# Patient Record
Sex: Female | Born: 1935 | Race: White | Hispanic: No | Marital: Single | Smoking: Former smoker
Health system: Southern US, Community
[De-identification: ages and names within clinical notes are randomized; demographics above are authoritative.]

## PROBLEM LIST (undated history)

## (undated) DIAGNOSIS — N183 Chronic kidney disease, stage 3 unspecified: Secondary | ICD-10-CM

## (undated) DIAGNOSIS — I509 Heart failure, unspecified: Secondary | ICD-10-CM

## (undated) DIAGNOSIS — E079 Disorder of thyroid, unspecified: Secondary | ICD-10-CM

## (undated) DIAGNOSIS — E876 Hypokalemia: Secondary | ICD-10-CM

## (undated) DIAGNOSIS — I10 Essential (primary) hypertension: Secondary | ICD-10-CM

## (undated) DIAGNOSIS — J449 Chronic obstructive pulmonary disease, unspecified: Secondary | ICD-10-CM

## (undated) DIAGNOSIS — E785 Hyperlipidemia, unspecified: Secondary | ICD-10-CM

## (undated) DIAGNOSIS — E559 Vitamin D deficiency, unspecified: Secondary | ICD-10-CM

## (undated) DIAGNOSIS — K219 Gastro-esophageal reflux disease without esophagitis: Secondary | ICD-10-CM

## (undated) HISTORY — DX: Vitamin D deficiency, unspecified: E55.9

## (undated) HISTORY — DX: Hypokalemia: E87.6

## (undated) HISTORY — DX: Disorder of thyroid, unspecified: E07.9

## (undated) HISTORY — DX: Hyperlipidemia, unspecified: E78.5

## (undated) HISTORY — PX: ABDOMINAL HYSTERECTOMY: SHX81

## (undated) HISTORY — DX: Essential (primary) hypertension: I10

## (undated) HISTORY — PX: CHOLECYSTECTOMY: SHX55

---

## 2012-03-01 ENCOUNTER — Emergency Department (HOSPITAL_COMMUNITY): Payer: Medicare Other

## 2012-03-01 ENCOUNTER — Inpatient Hospital Stay (HOSPITAL_COMMUNITY)
Admission: EM | Admit: 2012-03-01 | Discharge: 2012-03-13 | DRG: 871 | Disposition: A | Discharge status: Skilled Nursing Facility | Payer: Medicare Other | Attending: Pulmonary Disease | Admitting: Pulmonary Disease

## 2012-03-01 DIAGNOSIS — E876 Hypokalemia: Secondary | ICD-10-CM | POA: Diagnosis present

## 2012-03-01 DIAGNOSIS — J982 Interstitial emphysema: Secondary | ICD-10-CM | POA: Diagnosis present

## 2012-03-01 DIAGNOSIS — R1310 Dysphagia, unspecified: Secondary | ICD-10-CM | POA: Diagnosis present

## 2012-03-01 DIAGNOSIS — Z66 Do not resuscitate: Secondary | ICD-10-CM | POA: Diagnosis present

## 2012-03-01 DIAGNOSIS — K7200 Acute and subacute hepatic failure without coma: Secondary | ICD-10-CM | POA: Diagnosis present

## 2012-03-01 DIAGNOSIS — G9340 Encephalopathy, unspecified: Secondary | ICD-10-CM

## 2012-03-01 DIAGNOSIS — E870 Hyperosmolality and hypernatremia: Secondary | ICD-10-CM | POA: Diagnosis present

## 2012-03-01 DIAGNOSIS — T797XXA Traumatic subcutaneous emphysema, initial encounter: Secondary | ICD-10-CM

## 2012-03-01 DIAGNOSIS — K223 Perforation of esophagus: Secondary | ICD-10-CM | POA: Diagnosis present

## 2012-03-01 DIAGNOSIS — J9601 Acute respiratory failure with hypoxia: Secondary | ICD-10-CM

## 2012-03-01 DIAGNOSIS — E785 Hyperlipidemia, unspecified: Secondary | ICD-10-CM | POA: Diagnosis present

## 2012-03-01 DIAGNOSIS — A419 Sepsis, unspecified organism: Principal | ICD-10-CM | POA: Diagnosis present

## 2012-03-01 DIAGNOSIS — M4120 Other idiopathic scoliosis, site unspecified: Secondary | ICD-10-CM | POA: Diagnosis present

## 2012-03-01 DIAGNOSIS — D72829 Elevated white blood cell count, unspecified: Secondary | ICD-10-CM

## 2012-03-01 DIAGNOSIS — E872 Acidosis: Secondary | ICD-10-CM

## 2012-03-01 DIAGNOSIS — N179 Acute kidney failure, unspecified: Secondary | ICD-10-CM | POA: Diagnosis present

## 2012-03-01 DIAGNOSIS — I2510 Atherosclerotic heart disease of native coronary artery without angina pectoris: Secondary | ICD-10-CM | POA: Diagnosis present

## 2012-03-01 DIAGNOSIS — I248 Other forms of acute ischemic heart disease: Secondary | ICD-10-CM | POA: Diagnosis present

## 2012-03-01 DIAGNOSIS — I10 Essential (primary) hypertension: Secondary | ICD-10-CM | POA: Diagnosis present

## 2012-03-01 DIAGNOSIS — R4182 Altered mental status, unspecified: Secondary | ICD-10-CM

## 2012-03-01 DIAGNOSIS — J9690 Respiratory failure, unspecified, unspecified whether with hypoxia or hypercapnia: Secondary | ICD-10-CM

## 2012-03-01 DIAGNOSIS — I2489 Other forms of acute ischemic heart disease: Secondary | ICD-10-CM | POA: Diagnosis present

## 2012-03-01 DIAGNOSIS — J9600 Acute respiratory failure, unspecified whether with hypoxia or hypercapnia: Secondary | ICD-10-CM | POA: Diagnosis present

## 2012-03-01 DIAGNOSIS — I252 Old myocardial infarction: Secondary | ICD-10-CM

## 2012-03-01 DIAGNOSIS — Y849 Medical procedure, unspecified as the cause of abnormal reaction of the patient, or of later complication, without mention of misadventure at the time of the procedure: Secondary | ICD-10-CM | POA: Diagnosis present

## 2012-03-01 DIAGNOSIS — J189 Pneumonia, unspecified organism: Secondary | ICD-10-CM | POA: Diagnosis present

## 2012-03-01 DIAGNOSIS — T8182XA Emphysema (subcutaneous) resulting from a procedure, initial encounter: Secondary | ICD-10-CM | POA: Diagnosis present

## 2012-03-01 DIAGNOSIS — J690 Pneumonitis due to inhalation of food and vomit: Secondary | ICD-10-CM

## 2012-03-01 LAB — POCT I-STAT, CHEM 8
Calcium, Ion: 1.04 mmol/L — ABNORMAL LOW (ref 1.13–1.30)
Creatinine, Ser: 2.1 mg/dL — ABNORMAL HIGH (ref 0.50–1.10)
Glucose, Bld: 121 mg/dL — ABNORMAL HIGH (ref 70–99)
HCT: 44 % (ref 36.0–46.0)
Hemoglobin: 15 g/dL (ref 12.0–15.0)

## 2012-03-01 LAB — RAPID URINE DRUG SCREEN, HOSP PERFORMED
Cocaine: NOT DETECTED
Opiates: NOT DETECTED
Tetrahydrocannabinol: NOT DETECTED

## 2012-03-01 LAB — CBC WITH DIFFERENTIAL/PLATELET
Basophils Absolute: 0 10*3/uL (ref 0.0–0.1)
Eosinophils Absolute: 0 10*3/uL (ref 0.0–0.7)
Hemoglobin: 14.4 g/dL (ref 12.0–15.0)
Lymphocytes Relative: 7 % — ABNORMAL LOW (ref 12–46)
MCH: 30.8 pg (ref 26.0–34.0)
MCHC: 32.7 g/dL (ref 30.0–36.0)
Monocytes Absolute: 0.9 10*3/uL (ref 0.1–1.0)
Neutrophils Relative %: 85 % — ABNORMAL HIGH (ref 43–77)
Platelets: 161 10*3/uL (ref 150–400)
RDW: 15.9 % — ABNORMAL HIGH (ref 11.5–15.5)
WBC Morphology: INCREASED

## 2012-03-01 LAB — POCT I-STAT 3, ART BLOOD GAS (G3+)
Acid-Base Excess: 4 mmol/L — ABNORMAL HIGH (ref 0.0–2.0)
Bicarbonate: 29.7 mEq/L — ABNORMAL HIGH (ref 20.0–24.0)
Patient temperature: 103
TCO2: 31 mmol/L (ref 0–100)
pH, Arterial: 7.377 (ref 7.350–7.450)
pO2, Arterial: 198 mmHg — ABNORMAL HIGH (ref 80.0–100.0)

## 2012-03-01 LAB — TYPE AND SCREEN: Antibody Screen: NEGATIVE

## 2012-03-01 LAB — COMPREHENSIVE METABOLIC PANEL
ALT: 3160 U/L — ABNORMAL HIGH (ref 0–35)
Albumin: 3 g/dL — ABNORMAL LOW (ref 3.5–5.2)
Alkaline Phosphatase: 95 U/L (ref 39–117)
Calcium: 9 mg/dL (ref 8.4–10.5)
Potassium: 3.8 mEq/L (ref 3.5–5.1)
Sodium: 145 mEq/L (ref 135–145)
Total Protein: 6.7 g/dL (ref 6.0–8.3)

## 2012-03-01 LAB — URINALYSIS, ROUTINE W REFLEX MICROSCOPIC
Nitrite: NEGATIVE
Specific Gravity, Urine: 1.019 (ref 1.005–1.030)
Urobilinogen, UA: 1 mg/dL (ref 0.0–1.0)
pH: 5.5 (ref 5.0–8.0)

## 2012-03-01 LAB — URINE MICROSCOPIC-ADD ON

## 2012-03-01 LAB — ETHANOL: Alcohol, Ethyl (B): <11 mg/dL (ref 0–11)

## 2012-03-01 LAB — CK: Total CK: 194 U/L — ABNORMAL HIGH (ref 7–177)

## 2012-03-01 LAB — APTT: aPTT: 33 seconds (ref 24–37)

## 2012-03-01 LAB — LACTIC ACID, PLASMA: Lactic Acid, Venous: 4.5 mmol/L — ABNORMAL HIGH (ref 0.5–2.2)

## 2012-03-01 MED ORDER — SODIUM CHLORIDE 0.9 % IV SOLN
250.0000 mL | INTRAVENOUS | Status: DC | PRN
  Administered 2012-03-10: 250 mL via INTRAVENOUS

## 2012-03-01 MED ORDER — ALBUTEROL SULFATE HFA 108 (90 BASE) MCG/ACT IN AERS: 4.0000 | INHALATION_SPRAY | RESPIRATORY_TRACT | Status: DC | PRN

## 2012-03-01 MED ORDER — PANTOPRAZOLE SODIUM 40 MG IV SOLR
40.0000 mg | Freq: Every day | INTRAVENOUS | Status: DC
  Administered 2012-03-03 – 2012-03-04 (×2): 40 mg via INTRAVENOUS
  Filled 2012-03-01 (×5): qty 40

## 2012-03-01 MED ORDER — ASPIRIN 300 MG RE SUPP
300.0000 mg | RECTAL | Status: AC
  Administered 2012-03-01: 300 mg via RECTAL
  Filled 2012-03-01: qty 1

## 2012-03-01 MED ORDER — VANCOMYCIN HCL IN DEXTROSE 1-5 GM/200ML-% IV SOLN: 1000.0000 mg | Freq: Once | INTRAVENOUS | Status: DC

## 2012-03-01 MED ORDER — HEPARIN SODIUM (PORCINE) 5000 UNIT/ML IJ SOLN
5000.0000 [IU] | Freq: Three times a day (TID) | INTRAMUSCULAR | Status: DC
  Administered 2012-03-02 – 2012-03-13 (×34): 5000 [IU] via SUBCUTANEOUS
  Filled 2012-03-01 (×39): qty 1

## 2012-03-01 MED ORDER — PIPERACILLIN-TAZOBACTAM 3.375 G IVPB
3.3750 g | Freq: Once | INTRAVENOUS | Status: AC
  Administered 2012-03-01: 3.375 g via INTRAVENOUS
  Filled 2012-03-01 (×2): qty 50

## 2012-03-01 MED ORDER — SODIUM CHLORIDE 0.9 % IV SOLN
INTRAVENOUS | Status: DC
  Administered 2012-03-01 – 2012-03-02 (×2): via INTRAVENOUS
  Administered 2012-03-03: 125 mL/h via INTRAVENOUS

## 2012-03-01 MED ORDER — VANCOMYCIN HCL IN DEXTROSE 1-5 GM/200ML-% IV SOLN
1000.0000 mg | Freq: Once | INTRAVENOUS | Status: AC
  Administered 2012-03-01: 1000 mg via INTRAVENOUS
  Filled 2012-03-01: qty 200

## 2012-03-01 MED ORDER — PIPERACILLIN-TAZOBACTAM 3.375 G IVPB 30 MIN: 3.3750 g | Freq: Once | INTRAVENOUS | Status: DC

## 2012-03-01 MED ORDER — ASPIRIN 81 MG PO CHEW: 324.0000 mg | CHEWABLE_TABLET | ORAL | Status: AC

## 2012-03-01 NOTE — Progress Notes (Signed)
Pt has scoliosis. Asymmetrical thoracic cage. Subcutaneous emphysema across upper chest.

## 2012-03-01 NOTE — ED Notes (Signed)
Pt noted to have crepitus from upper chest to rt eye.

## 2012-03-01 NOTE — ED Notes (Signed)
Per EMS- pt was found in her bed at home. Last seen normal ws 2 days ago.  Pt was unresponsive with shallow respirations at 10 per minute. Pt noted to have rhonchi with ems. bp was 110 palpated. Pt had no purposeful movements. Pt was diaphoretic and cyanotic upon EMS arrival. Pt ws intubated with O2 sats in the 90-91%pt ws given 2 of narcan with no change. Pupils were dilated initially and now pinpoint. Pt vomited with ET tube placement.

## 2012-03-01 NOTE — ED Provider Notes (Signed)
I saw and evaluated the patient, reviewed the resident's note and I agree with the findings and plan.  Agree with EKG interpretation  Pt arrived intubated from home where she was found hypoxic and unresponsive. Has pneumonia, fever, septic shock with multiple end organ damage. Also has subcutaneous emphysema likely from ETT trauma. No PTX. PCCM to admit. Abx given after cultures.   CRITICAL CARE Performed by: Pollyann Savoy   Total critical care time: 60  Critical care time was exclusive of separately billable procedures and treating other patients.  Critical care was necessary to treat or prevent imminent or life-threatening deterioration.  Critical care was time spent personally by me on the following activities: development of treatment plan with patient and/or surrogate as well as nursing, discussions with consultants, evaluation of patient's response to treatment, examination of patient, obtaining history from patient or surrogate, ordering and performing treatments and interventions, ordering and review of laboratory studies, ordering and review of radiographic studies, pulse oximetry and re-evaluation of patient's condition.   Charles B. Bernette Mayers, MD 03/01/12 2229

## 2012-03-01 NOTE — Progress Notes (Signed)
Pt has new subcutaneous emphysema in face and neck. MD notified.

## 2012-03-01 NOTE — ED Provider Notes (Signed)
History     CSN: [id number]  Arrival date & time 03/01/12  1951   First MD Initiated Contact with Patient 03/01/12 2002      Chief Complaint  Patient presents with  . Altered Mental Status    (Consider location/radiation/quality/duration/timing/severity/associated sxs/prior treatment) Patient is a 78 y.o. female presenting with altered mental status. The history is provided by the EMS personnel. The history is limited by the condition of the patient.  Altered Mental Status This is a new problem. The current episode started today. The problem occurs constantly. The problem has been unchanged. Nothing aggravates the symptoms. Treatments tried: narcan. The treatment provided no relief.   Level 5 caveat due to altered mental status and intubated No past medical history on file.  No past surgical history on file.  No family history on file.  History  Substance Use Topics  . Smoking status: Not on file  . Smokeless tobacco: Not on file  . Alcohol Use: Not on file    OB History    No data available      Review of Systems  Unable to perform ROS: Intubated  Psychiatric/Behavioral: Positive for altered mental status.    Allergies  Review of patient's allergies indicates not on file.  Home Medications   Current Outpatient Rx  Name Route Sig Dispense Refill  . AMLODIPINE BESYLATE 5 MG PO TABS Oral Take 5 mg by mouth daily.    . ATORVASTATIN CALCIUM 10 MG PO TABS Oral Take 10 mg by mouth at bedtime.    Marland Kitchen LEVOTHYROXINE SODIUM 175 MCG PO TABS Oral Take 175 mcg by mouth daily.    Marland Kitchen METOPROLOL TARTRATE 100 MG PO TABS Oral Take 100 mg by mouth daily.    . TRAMADOL HCL 50 MG PO TABS Oral Take 50 mg by mouth every 6 (six) hours as needed. pain    . VITAMIN D (ERGOCALCIFEROL) 50000 UNITS PO CAPS Oral Take 50,000 Units by mouth 2 (two) times a week.      BP 139/55  Pulse 86  Resp 24  SpO2 99%  Physical Exam  Constitutional: She is intubated.  HENT:  Head: Normocephalic and  atraumatic.  Eyes:       2 mm, not responsive  Cardiovascular: Normal rate, regular rhythm, normal heart sounds and intact distal pulses.   Pulmonary/Chest: She is intubated.       Equal breath sounds, coarse diffusely, upper airway noises  Abdominal: Soft. Bowel sounds are normal. She exhibits no distension.  Neurological: She is unresponsive. GCS eye subscore is 1. GCS verbal subscore is 1. GCS motor subscore is 1.  Skin: Skin is warm and dry.    ED Course  Procedures (including critical care time)  Labs Reviewed  CBC WITH DIFFERENTIAL - Abnormal; Notable for the following:    WBC 10.7 (*)     RDW 15.9 (*)     Neutrophils Relative 85 (*)     Lymphocytes Relative 7 (*)     Neutro Abs 9.0 (*)     All other components within normal limits  COMPREHENSIVE METABOLIC PANEL - Abnormal; Notable for the following:    Glucose, Bld 122 (*)     BUN 39 (*)     Creatinine, Ser 2.04 (*)     Albumin 3.0 (*)     AST 5010 (*)  RESULTS CONFIRMED BY MANUAL DILUTION   ALT 3160 (*)     GFR calc non Af Amer 22 (*)     GFR  calc Af Amer 25 (*)     All other components within normal limits  CK - Abnormal; Notable for the following:    Total CK 194 (*)     All other components within normal limits  URINALYSIS, ROUTINE W REFLEX MICROSCOPIC - Abnormal; Notable for the following:    Color, Urine AMBER (*)  BIOCHEMICALS MAY BE AFFECTED BY COLOR   APPearance CLOUDY (*)     Hgb urine dipstick SMALL (*)     Bilirubin Urine SMALL (*)     Protein, ur 100 (*)     All other components within normal limits  PROTIME-INR - Abnormal; Notable for the following:    Prothrombin Time 17.4 (*)     All other components within normal limits  LACTIC ACID, PLASMA - Abnormal; Notable for the following:    Lactic Acid, Venous 4.5 (*)     All other components within normal limits  POCT I-STAT, CHEM 8 - Abnormal; Notable for the following:    BUN 42 (*)     Creatinine, Ser 2.10 (*)     Glucose, Bld 121 (*)      Calcium, Ion 1.04 (*)     All other components within normal limits  POCT I-STAT TROPONIN I - Abnormal; Notable for the following:    Troponin i, poc 0.65 (*)     All other components within normal limits  POCT I-STAT 3, BLOOD GAS (G3+) - Abnormal; Notable for the following:    pCO2 arterial 51.8 (*)     pO2, Arterial 198.0 (*)     Bicarbonate 29.7 (*)     Acid-Base Excess 4.0 (*)     All other components within normal limits  URINE MICROSCOPIC-ADD ON - Abnormal; Notable for the following:    Squamous Epithelial / LPF FEW (*)     Bacteria, UA FEW (*)     Casts GRANULAR CAST (*)  HYALINE CASTS   All other components within normal limits  APTT  PROCALCITONIN  ETHANOL  URINE RAPID DRUG SCREEN (HOSP PERFORMED)  TYPE AND SCREEN  URINE CULTURE  CULTURE, BLOOD (ROUTINE X 2)  CULTURE, BLOOD (ROUTINE X 2)  CBC  CREATININE, SERUM  CBC  BASIC METABOLIC PANEL  BLOOD GAS, ARTERIAL  MAGNESIUM  PHOSPHORUS  LACTIC ACID, PLASMA  CORTISOL  FIBRINOGEN  CULTURE, EXPECTORATED SPUTUM-ASSESSMENT   Ct Head Wo Contrast  03/01/2012  *RADIOLOGY REPORT*  Clinical Data: The patient was found in the head at home.  Last seen normal 2 days ago.  The patient was unresponsive to shallow respirations.  Pupils dilated initially and now and point. Vomiting with endotracheal tube placement.  CT HEAD WITHOUT CONTRAST  Technique:  Contiguous axial images were obtained from the base of the skull through the vertex without contrast.  Comparison: None.  Findings: Technically limited study due to motion artifact. Diffuse cerebral and cerebellar atrophy.  Mild ventricular dilatation consistent with central atrophy.  Low attenuation change throughout the deep white matter consistent with small vessel ischemic changes.  Old appearing lacunar infarcts in the thalami. No mass effect or midline shift.  No sulci effacement.  No abnormal extra-axial fluid collections.  Gray-white matter junctions are distinct.  Basal cisterns are  not effaced.  No depressed skull fractures.  Visualized paranasal sinuses and mastoid air cells are not opacified.  Vascular calcifications.  Note of subcutaneous emphysema in the upper neck extending to the skull base region.  IMPRESSION: No acute intracranial abnormality.  Diffuse bilateral cerebral atrophy and  small vessel ischemic change.  Original Report Authenticated By: Marlon Pel, M.D.   Ct Soft Tissue Neck Wo Contrast  03/01/2012  *RADIOLOGY REPORT*  Clinical Data: Altered mental status.  Unresponsive.  Subcutaneous air.  CT NECK WITHOUT CONTRAST  Technique:  Multidetector CT imaging of the neck was performed without intravenous contrast.  Comparison: None.  Findings: There is extensive subcutaneous emphysema throughout the soft tissues in the anterior chest wall, supraclavicular region, and neck.  Right periorbital soft tissue swelling with extensive subcutaneous emphysema.  Emphysema in the right lateral extraconal space.  No retrobulbar emphysema.  Emphysema throughout the fat planes of the neck pain extending down into the mediastinum.  There is an endotracheal tube in place.  Degenerative changes in the cervical spine.  No discrete facial fractures are identified. Paranasal sinuses are not opacified.  No discrete soft tissue mass lesions are demonstrated.  No significant cervical lymphadenopathy. Thyroid gland is not enlarged.  IMPRESSION: Extensive emphysematous changes throughout the subcutaneous fat, deep fat planes, the upper mediastinum, and right periorbital/extraconal regions.  Original Report Authenticated By: Marlon Pel, M.D.   Ct Chest Wo Contrast  03/01/2012  *RADIOLOGY REPORT*  Clinical Data: Altered mental status.  The patient found unresponsive.  Subcutaneous air.  CT CHEST WITHOUT CONTRAST  Technique:  Multidetector CT imaging of the chest was performed following the standard protocol without IV contrast.  Comparison: None.  Findings: There is extensive emphysema  throughout the subcutaneous fat and intramuscular planes from the base of the neck along the anterior chest wall.  Pneumomediastinum extending from the thoracic inlet throughout the mediastinum and into the pericardial space. No definite pneumothorax.  Suggestion of a tiny sliver of a air in the anterior left pleural space medially, however this may just be extension of the pneumomediastinum.  There is no pulmonary collapse.  Normal heart size.  Calcification of the aorta and coronary arteries.  No aortic aneurysm.  No significant lymphadenopathy in the chest.  No pleural effusions.  Mild emphysematous changes in the lungs.  Consolidation in the left lung base which may represent atelectasis or pneumonia.  Patchy air space disease throughout the upper lungs and left lingula and right middle lung suggesting fibrosis or pneumonitis.  No discrete mass lesion identified.  Degenerative changes in the thoracic spine with normal alignment. Focal irregularity of the mid sternum which appears to be due to motion artifact.  No displaced rib fractures appreciated.  Visualized portions of the upper abdominal organs are unremarkable except for some hyperplasia of the left adrenal gland.  IMPRESSION: Extensive subcutaneous emphysema in the neck and anterior chest wall.  Diffuse pneumomediastinum and pneumopericardium.  No definite pneumothorax.  Consider esophageal rupture.  Consolidation in the left lower lung with patchy airspace infiltrates in the left upper lung, right middle lung, and lingula.  Original Report Authenticated By: Marlon Pel, M.D.   Dg Chest Port 1 View  03/01/2012  *RADIOLOGY REPORT*  Clinical Data: Increased subcutaneous air in the face and neck.  PORTABLE CHEST - 1 VIEW  Comparison: 03/01/2012 at 23 hours  Findings: Endotracheal tube tip is about 11 mm above the carina and directed towards the right mainstem bronchus.  Persistent appearance of significant subcutaneous emphysema in the neck and  upper chest bilaterally.  This appears to be progressing since the previous study.  No visible pneumothorax.  Infiltration in the left mid lung suggesting pneumonitis or contusion.  This is stable. Heart size and pulmonary vascularity are normal.  IMPRESSION: Endotracheal  tube tip is just above the carina.  Persistent airspace disease in the left mid lung.  Increasing subcutaneous emphysema in the neck and upper chest bilaterally.  No visible pneumothorax.  Original Report Authenticated By: Marlon Pel, M.D.   Dg Chest Port 1 View  03/01/2012  *RADIOLOGY REPORT*  Clinical Data: 78 year old female status post intubation. Trauma patient.  PORTABLE CHEST - 1 VIEW  Comparison: None.  Findings: Portable supine AP view at 2003 hours.  The patient is rotated to the left.  Endotracheal tube tip in place just above the level of the carina.  Subcutaneous gas in the right supraclavicular fossa.  No definite pneumothorax.  Patchy opacity in the left lung, especially apparent lateral to the left hilum.  Grossly normal mediastinal contour.  No definite acute fracture identified in the thorax.  There may be a small right effusion.  IMPRESSION: 1.  Endotracheal tube tip in satisfactory position about 10 mm above the carina. 2.  Rotated. 3.  Subcutaneous gas in the right neck which could be related to instrumentation in this region, dissecting gas from pneumomediastinum or pneumothorax (no pneumothorax identified), or less likely dissecting gas from the abdomen. 4.  Evidence of left lung pulmonary contusion versus aspiration versus pneumonia.  Original Report Authenticated By: Harley Hallmark, M.D.     Date: 03/01/2012  Rate: 88  Rhythm: normal sinus rhythm  QRS Axis: normal  Intervals: normal  ST/T Wave abnormalities: normal  Conduction Disutrbances:right bundle branch block  Narrative Interpretation:   Old EKG Reviewed: none available    1. CAP (community acquired pneumonia)   2. Respiratory failure   3.  Subcutaneous emphysema   4. Septic shock   5. Elevated troponin   6. Elevated LFTs   7. Lactic acid acidosis   8. Acute renal injury   9. Altered mental status   10. Leukocytosis       MDM  This is an 78 year old female who presents here today with altered mental status. Per EMS the grandson went to go visit the patient today, and had not heard from her all day long; she was last seen normal 2 days ago. When he arrived at her house she was found curled up on her side but she was sleeping, but would not wake up for him so he called EMS. When EMS arrived she was unresponsive, an NPA and OPA were placed, and the patient tolerated these without problems. She did not have any improvement in her mental status, so EMS intubated the patient. She was initially hypoventilating, diaphoretic and cyanotic upon EMS arrival and had sats in the low 90s after intubation. Narcan was given, but made no change in her mental status. At this time the patient is nonresponsive, has a GCS of 3, her pupils are 2 mm and unresponsive, and eyes are fixed midline, there is no obvious external trauma. She has coarse breath sounds bilaterally with significant upper airway sounds. This improved after suctioning, as did her O2 sats. The patient is febrile rectal temperature. We'll check lab work, CT of the head chest x-ray to evaluate for the underlying etiology of the patient's presentation, start her on getting Vancomycin and Zosyn, and consult critical care for admission.  Notified by respiratory therapist that the patient had some subcutaneous air. On reexamination, the patient does have crepitus along the upper chest, neck and extending up into the face. Repeat chest x-ray was obtained which showed worsening subcutaneous air, but not showing obvious pneumothorax. We'll get a  CT of the neck and the chest to evaluate for underlying etiology. Labs so far show multisystem organ failure.  Significant subcutaneous air, but no obvious  etiology for this. The patient is to be admitted to the ICU.      Theotis Burrow, MD 03/02/12 0010

## 2012-03-01 NOTE — ED Notes (Signed)
Pt in CT with RN, NT and respiratory to transport.

## 2012-03-02 ENCOUNTER — Inpatient Hospital Stay (HOSPITAL_COMMUNITY): Payer: Medicare Other

## 2012-03-02 DIAGNOSIS — J9600 Acute respiratory failure, unspecified whether with hypoxia or hypercapnia: Secondary | ICD-10-CM

## 2012-03-02 DIAGNOSIS — J982 Interstitial emphysema: Secondary | ICD-10-CM

## 2012-03-02 DIAGNOSIS — J189 Pneumonia, unspecified organism: Secondary | ICD-10-CM

## 2012-03-02 DIAGNOSIS — G9340 Encephalopathy, unspecified: Secondary | ICD-10-CM

## 2012-03-02 DIAGNOSIS — R6521 Severe sepsis with septic shock: Secondary | ICD-10-CM

## 2012-03-02 DIAGNOSIS — A419 Sepsis, unspecified organism: Principal | ICD-10-CM

## 2012-03-02 LAB — CBC
HCT: 38.4 % (ref 36.0–46.0)
Hemoglobin: 12.8 g/dL (ref 12.0–15.0)
RBC: 4.18 MIL/uL (ref 3.87–5.11)
WBC: 17.4 10*3/uL — ABNORMAL HIGH (ref 4.0–10.5)

## 2012-03-02 LAB — GLUCOSE, CAPILLARY
Glucose-Capillary: 148 mg/dL — ABNORMAL HIGH (ref 70–99)
Glucose-Capillary: 123 mg/dL — ABNORMAL HIGH (ref 70–99)

## 2012-03-02 LAB — POCT I-STAT 3, ART BLOOD GAS (G3+)
Bicarbonate: 26.6 mEq/L — ABNORMAL HIGH (ref 20.0–24.0)
O2 Saturation: 94 %
TCO2: 28 mmol/L (ref 0–100)
pCO2 arterial: 42.8 mmHg (ref 35.0–45.0)
pO2, Arterial: 72 mmHg — ABNORMAL LOW (ref 80.0–100.0)

## 2012-03-02 LAB — LACTIC ACID, PLASMA: Lactic Acid, Venous: 2.3 mmol/L — ABNORMAL HIGH (ref 0.5–2.2)

## 2012-03-02 LAB — BASIC METABOLIC PANEL
BUN: 47 mg/dL — ABNORMAL HIGH (ref 6–23)
CO2: 24 mEq/L (ref 19–32)
Chloride: 105 mEq/L (ref 96–112)
GFR calc Af Amer: 21 mL/min — ABNORMAL LOW (ref >90)
Glucose, Bld: 155 mg/dL — ABNORMAL HIGH (ref 70–99)
Potassium: 3.7 mEq/L (ref 3.5–5.1)

## 2012-03-02 LAB — MRSA PCR SCREENING: MRSA by PCR: NEGATIVE

## 2012-03-02 LAB — CORTISOL: Cortisol, Plasma: 145.6 ug/dL

## 2012-03-02 LAB — CARDIAC PANEL(CRET KIN+CKTOT+MB+TROPI)
CK, MB: 2.9 ng/mL (ref 0.3–4.0)
Relative Index: 1.5 (ref 0.0–2.5)
Total CK: 179 U/L — ABNORMAL HIGH (ref 7–177)
Troponin I: 0.81 ng/mL (ref <0.30)
Troponin I: 0.6 ng/mL (ref <0.30)

## 2012-03-02 MED ORDER — VANCOMYCIN HCL 500 MG IV SOLR
500.0000 mg | INTRAVENOUS | Status: DC
  Administered 2012-03-04: 500 mg via INTRAVENOUS
  Filled 2012-03-02: qty 500

## 2012-03-02 MED ORDER — PHENYLEPHRINE HCL 10 MG/ML IJ SOLN
30.0000 ug/min | INTRAVENOUS | Status: DC
  Administered 2012-03-02: 10 ug/min via INTRAVENOUS
  Filled 2012-03-02: qty 2

## 2012-03-02 MED ORDER — CHLORHEXIDINE GLUCONATE 0.12 % MT SOLN
OROMUCOSAL | Status: AC
  Administered 2012-03-02: 15 mL
  Filled 2012-03-02: qty 15

## 2012-03-02 MED ORDER — CHLORHEXIDINE GLUCONATE 0.12 % MT SOLN
15.0000 mL | Freq: Two times a day (BID) | OROMUCOSAL | Status: DC
  Administered 2012-03-02 – 2012-03-05 (×7): 15 mL via OROMUCOSAL
  Filled 2012-03-02 (×7): qty 15

## 2012-03-02 MED ORDER — SODIUM CHLORIDE 0.9 % IV BOLUS (SEPSIS)
1000.0000 mL | Freq: Once | INTRAVENOUS | Status: AC
  Administered 2012-03-02: 1000 mL via INTRAVENOUS

## 2012-03-02 MED ORDER — SODIUM CHLORIDE 0.9 % IV BOLUS (SEPSIS)
500.0000 mL | Freq: Once | INTRAVENOUS | Status: AC
  Administered 2012-03-02: 500 mL via INTRAVENOUS

## 2012-03-02 MED ORDER — BIOTENE DRY MOUTH MT LIQD
15.0000 mL | Freq: Four times a day (QID) | OROMUCOSAL | Status: DC
  Administered 2012-03-03 – 2012-03-06 (×15): 15 mL via OROMUCOSAL

## 2012-03-02 MED ORDER — JEVITY 1.2 CAL PO LIQD
1000.0000 mL | ORAL | Status: DC
  Filled 2012-03-02 (×2): qty 1000

## 2012-03-02 MED ORDER — FENTANYL CITRATE 0.05 MG/ML IJ SOLN
25.0000 ug | INTRAMUSCULAR | Status: DC | PRN
  Administered 2012-03-02 – 2012-03-03 (×5): 50 ug via INTRAVENOUS
  Administered 2012-03-04: 25 ug via INTRAVENOUS
  Administered 2012-03-04 (×2): 50 ug via INTRAVENOUS
  Administered 2012-03-05 – 2012-03-07 (×4): 25 ug via INTRAVENOUS
  Filled 2012-03-02 (×11): qty 2

## 2012-03-02 MED ORDER — PIPERACILLIN-TAZOBACTAM IN DEX 2-0.25 GM/50ML IV SOLN
2.2500 g | Freq: Four times a day (QID) | INTRAVENOUS | Status: DC
  Administered 2012-03-02 – 2012-03-04 (×9): 2.25 g via INTRAVENOUS
  Filled 2012-03-02 (×11): qty 50

## 2012-03-02 NOTE — Progress Notes (Signed)
CRITICAL VALUE ALERT  Critical value received:  Troponin 0.81  Date of notification:  03/02/2012  Time of notification:  0635  Critical value read back:yes  Nurse who received alert:  Clois Comber RN  MD notified (1st page):  Dr Deterding  Time of first page:  [phone]  MD notified (2nd page):  Time of second page:  Responding MD:  Dr Darrick Penna  Time MD responded:  [phone]

## 2012-03-02 NOTE — Procedures (Signed)
Arterial Catheter Insertion Procedure Note [facility] [id number] 1928/09/15  Procedure: Insertion of Arterial Catheter  Indications: Blood pressure monitoring  Procedure Details Consent: Unable to obtain consent because of altered level of consciousness. Time Out: Verified patient identification, verified procedure, site/side was marked, verified correct patient position, special equipment/implants available, medications/allergies/relevent history reviewed, required imaging and test results available.  Performed  Maximum sterile technique was used including antiseptics, gloves, gown, hand hygiene and mask. Skin prep: Chlorhexidine; local anesthetic administered 20 gauge catheter was inserted into right radial artery using the Seldinger technique.  Evaluation Blood flow good; BP tracing good. Complications: No apparent complications.Patient had subcutaneous air prior to arterial insertion.   Leafy Half 03/02/2012

## 2012-03-02 NOTE — Progress Notes (Signed)
ANTIBIOTIC CONSULT NOTE - INITIAL  Pharmacy Consult for Vancocin/Zosyn Indication: rule out pneumonia and rule out sepsis  Not on File  Patient Measurements: Height: 5\' 1"  (154.9 cm) Weight: 124 lb 9 oz (56.5 kg) IBW/kg (Calculated) : 47.8   Vital Signs: Temp: 99 F (37.2 C) (07/21 0045) Temp src: Oral (07/21 0045) BP: 116/59 mmHg (07/21 0045) Pulse Rate: 86  (07/21 0045)  Labs:  Alvira Philips 03/01/12 2022 03/01/12 1959  WBC -- 10.7*  HGB 15.0 14.4  PLT -- 161  LABCREA -- --  CREATININE 2.10* 2.04*   Estimated Creatinine Clearance: 15.9 ml/min (by C-G formula based on Cr of 2.1).  Microbiology: No results found for this or any previous visit (from the past 720 hour(s)).  Medical History: No past medical history on file.  Medications:  Prescriptions prior to admission  Medication Sig Dispense Refill  . amLODipine (NORVASC) 5 MG tablet Take 5 mg by mouth daily.      Marland Kitchen atorvastatin (LIPITOR) 10 MG tablet Take 10 mg by mouth at bedtime.      Marland Kitchen levothyroxine (SYNTHROID, LEVOTHROID) 175 MCG tablet Take 175 mcg by mouth daily.      . metoprolol (LOPRESSOR) 100 MG tablet Take 100 mg by mouth daily.      . traMADol (ULTRAM) 50 MG tablet Take 50 mg by mouth every 6 (six) hours as needed. pain      . Vitamin D, Ergocalciferol, (DRISDOL) 50000 UNITS CAPS Take 50,000 Units by mouth 2 (two) times a week.       Scheduled:    . aspirin  324 mg Oral NOW   Or  . aspirin  300 mg Rectal NOW  . heparin  5,000 Units Subcutaneous Q8H  . pantoprazole (PROTONIX) IV  40 mg Intravenous QHS  . piperacillin-tazobactam (ZOSYN)  IV  3.375 g Intravenous Once  . vancomycin  1,000 mg Intravenous Once  . DISCONTD: piperacillin-tazobactam  3.375 g Intravenous Once  . DISCONTD: vancomycin  1,000 mg Intravenous Once    Assessment: 78yo female found unresponsive at home by grandson, in respiratory failure, intubated in field, CXR/CT show possible PNA, to begin IV ABX.  Goal of Therapy:    Vancomycin trough level 15-20 mcg/ml  Plan:  Rec'd vanc 1g and Zosyn 3.375g IV in ED; will continue with vancomycin 500mg  IV Q48H and Zosyn 2.25g IV Q6H and monitor CBC, Cx, levels prn.  Colleen Can PharmD BCPS 03/02/2012,12:59 AM

## 2012-03-02 NOTE — H&P (Signed)
Name: [name] [name] MRN: [id number] DOB: [birth year]    LOS: 1  PULMONARY / CRITICAL CARE MEDICINE  HPI:  78 year old female with PMH relevant for HTN, hypothyroidism and CAD with history of MI. She recently moved from [place] about a week ago. She is living by herself with close care from her grandson Edwinna Areola). She was last fine the day prior to admission. At that time she reported to her grandson that she was feeling warm. Today she did not respond the phone and her grandson found her in bed, unresponsive, pale with blue coloration, breathing spontaneously. He called 911 and the patient was intubated on the field and brought to the ED. Purulent secretions were aspirated from ET tube. In the ED the patient developed subcutaneous emphysema likely secondary to tracheal trauma during intubation. Patient unable to provide history. Lucila Maine does not know all the details about her medical history. Power of attorney is Gordy Savers in [place] [phone]. The patient is DNR.  No past medical history on file. No past surgical history on file. Prior to Admission medications   Medication Sig Start Date End Date Taking? Authorizing Provider  amLODipine (NORVASC) 5 MG tablet Take 5 mg by mouth daily.   Yes Historical Provider, MD  atorvastatin (LIPITOR) 10 MG tablet Take 10 mg by mouth at bedtime.   Yes Historical Provider, MD  levothyroxine (SYNTHROID, LEVOTHROID) 175 MCG tablet Take 175 mcg by mouth daily.   Yes Historical Provider, MD  metoprolol (LOPRESSOR) 100 MG tablet Take 100 mg by mouth daily.   Yes Historical Provider, MD  traMADol (ULTRAM) 50 MG tablet Take 50 mg by mouth every 6 (six) hours as needed. pain   Yes Historical Provider, MD  Vitamin D, Ergocalciferol, (DRISDOL) 50000 UNITS CAPS Take 50,000 Units by mouth 2 (two) times a week.   Yes Historical Provider, MD   Allergies Not on File  Family History No family history on file. Social History  does not have a  smoking history on file. She does not have any smokeless tobacco history on file. Her alcohol and drug histories not on file.  Review Of Systems:  Unable to provide.   Vital Signs: Temp:  [98.3 F (36.8 C)-102.7 F (39.3 C)] 98.3 F (36.8 C) (07/21 0400) Pulse Rate:  [71-120] 71  (07/21 0445) Resp:  [16-24] 18  (07/21 0445) BP: (79-152)/(41-76) 88/45 mmHg (07/21 0445) SpO2:  [86 %-100 %] 95 % (07/21 0445) Arterial Line BP: (64-162)/(47-79) 64/60 mmHg (07/21 0445) FiO2 (%):  [40 %-100 %] 40 % (07/21 0400) Weight:  [124 lb 9 oz (56.5 kg)] 124 lb 9 oz (56.5 kg) (07/21 0045)  Physical Examination: General:  Intubated, mechanically ventilated, no acute distress Neuro:  Sedated, synchronous, nonfocal HEENT:  PERRL, pink conjunctivae, moist membranes Neck:  Supple, no JVD   Cardiovascular:  RRR, no M/R/G Lungs:  Bilateral diminished air entry, coarse breath sounds. Abdomen:  Soft, nontender, nondistended, bowel sounds present Musculoskeletal:  Moves all extremities, no pedal edema Skin:  No rash    ASSESSMENT AND PLAN  PULMONARY  Lab 03/02/12 0343 03/01/12 2057  PHART 7.401 7.377  PCO2ART 42.8 51.8*  PO2ART 72.0* 198.0*  HCO3 26.6* 29.7*  O2SAT 94.0 100.0   Ventilator Settings: Vent Mode:  [-] PRVC FiO2 (%):  [40 %-100 %] 40 % Set Rate:  [18 bmp] 18 bmp Vt Set:  [450 mL] 450 mL PEEP:  [5 cmH20] 5 cmH20 Plateau Pressure:  [15 cmH20-23 cmH20] 15 cmH20  CXR:  Left lung infiltrates suggestive of pneumonia. LLL consolidation on chest CT ETT:  Adequate position.  A:   1) Pneumonia / sepsis. Unclear if there was an aspiration event. 2) Acute hypoxemic respiratory failure. 3) Subcutaneous emphysema likely to trauma during intubation. Chest and neck CT showed pneumomediastinum, pneumopericardium. P:   - Zosyn and vancomycin - Mechanical ventilation   - PRVC, Vt 6 cc/kg, PEEP of 5, RR: 18, FiO2 to keep O2 sat >94% - VAP order set  CARDIOVASCULAR  Lab 03/02/12 0345  03/01/12 2027  TROPONINI -- --  LATICACIDVEN 2.3* 4.5*  PROBNP -- --   Lines: Peripheral lines  A:  1) Mildly elevated troponin P:  - Will follow serial cardiac enzymes   RENAL  Lab 03/02/12 0345 03/01/12 2022 03/01/12 1959  NA 146* 143 145  K 3.7 3.9 --  CL 105 104 100  CO2 24 -- 29  BUN 47* 42* 39*  CREATININE 2.33* 2.10* 2.04*  CALCIUM 8.0* -- 9.0  MG 2.0 -- --  PHOS 3.4 -- --   Intake/Output      07/20 0701 - 07/21 0700   I.V. (mL/kg) 632.5 (11.2)   IV Piggyback 1750   Total Intake(mL/kg) 2382.5 (42.2)   Urine (mL/kg/hr) 40 (0)   Total Output 40   Net +2342.5        Foley:  Placed today  A:   1) Acute renal failure, likely pre renal  P:   - Fluid resuscitation - Follow up CMP  GASTROINTESTINAL  Lab 03/01/12 1959  AST 5010*  ALT 3160*  ALKPHOS 95  BILITOT 0.6  PROT 6.7  ALBUMIN 3.0*    A:   1) Elevated LFT's. Likely shock liver P:   - Will follow LFT's  HEMATOLOGIC  Lab 03/02/12 0345 03/01/12 2022 03/01/12 1959  HGB 12.8 15.0 14.4  HCT 38.4 44.0 44.0  PLT 133* -- 161  INR -- -- 1.40  APTT -- -- 33   A:   No issues P:  Follow up CBC  INFECTIOUS  Lab 03/02/12 0345 03/01/12 1959  WBC 17.4* 10.7*  PROCALCITON -- 17.31   Cultures: Sent today Antibiotics: Zosyn and vancomycin 03/01/12  A:   1) Sepsis likely secondary to pneumonia P:   - Sepsis protocol - Antibiotics as above - Will follow cultures  ENDOCRINE  Lab 03/02/12 0344  GLUCAP 160*   A:   - No issues  P:   - POCT glucose monitoring  NEUROLOGIC  A:   1) Unresponsive with no sedation. CT scan of the head with no acute intracranial process. Concern for anoxic brain injury.   2) Drug screen is negative P:   1) No sedation  2) We will continue to monitor mental status. May need MRI soon.  BEST PRACTICE / DISPOSITION - Level of Care:  ICU - Primary Service:  CCM - Consultants:  None - Code Status:  DNR - Diet:  NPO - DVT Px:  Heparin - GI Px:   Protonix - Skin Integrity:  Intact - Social / Family:  Grandson updated at bedside.  The patient is critically ill with multiple organ systems failure and requires high complexity decision making for assessment and support, frequent evaluation and titration of therapies, application of advanced monitoring technologies and extensive interpretation of multiple databases.   Critical Care Time devoted to patient care services described in this note is: 1 Hour  Overton Mam, M.D. Pulmonary and Critical Care Medicine [facility] Pager: [phone]  03/02/2012, 5:23 AM

## 2012-03-02 NOTE — Progress Notes (Addendum)
Urine output 0cc x 2 hours. Subcutaneous air noted on left side at this time. Elink RN made aware. Will continue to monitor closely.

## 2012-03-02 NOTE — Progress Notes (Addendum)
BP 87/46 HR 77. Dr Deterding made aware. Orders received. Will continue to monitor closely. MD also advised-pt moving right hand and foot but no movement noted to left side. Will continue to monitor closely.

## 2012-03-02 NOTE — Progress Notes (Signed)
Name: [name] [name] MRN: [id number] DOB: 31-Jul-1929    LOS: 1  PULMONARY / CRITICAL CARE MEDICINE  HPI:  78 year old female with PMH relevant for HTN, hypothyroidism and CAD with history of MI. She recently moved from [place] about a week ago. She is living by herself with close care from her grandson Edwinna Areola). She was last fine 24h prior to admission. At that time she reported to her grandson that she was feeling warm. ON 7/20  she did not respond the phone and her grandson found her in bed, unresponsive, pale with blue coloration, breathing spontaneously. He called 911 and the patient was intubated on the field and brought to the ED. Purulent secretions were aspirated from ET tube. In the ED the patient developed subcutaneous emphysema likely secondary to tracheal trauma during intubation. Head CT neg . Pt vomited with ET tube placement.   Power of attorney is Gordy Savers in [place] [phone]. The patient is DNR.  EVENTS : 7/21 - CT chest Extensive subcutaneous emphysema in the neck and anterior chest wall. Diffuse pneumomediastinum and pneumopericardium. No definite pneumothorax. Consider esophageal rupture. Consolidation in the left lower lung 7/21 sepsis protocol  Current status : Off pressors, hypertensive, no obvious pain    Vital Signs: Temp:  [98.3 F (36.8 C)-102.7 F (39.3 C)] 98.5 F (36.9 C) (07/21 1200) Pulse Rate:  [68-120] 90  (07/21 1300) Resp:  [16-26] 24  (07/21 1300) BP: (79-190)/(41-82) 156/73 mmHg (07/21 1300) SpO2:  [86 %-100 %] 99 % (07/21 1300) Arterial Line BP: (64-174)/(47-79) 171/77 mmHg (07/21 1300) FiO2 (%):  [39.8 %-100 %] 40.1 % (07/21 1300) Weight:  [56.5 kg (124 lb 9 oz)-61.2 kg (134 lb 14.7 oz)] 61.2 kg (134 lb 14.7 oz) (07/21 0645)  Physical Examination: General:  Intubated, mechanically ventilated, no acute distress Neuro:  Opens eyes, synchronous, decreased movements LUE HEENT:  PERRL, pink conjunctivae, moist  membranes Neck:  Supple, no JVD   Cardiovascular:  RRR, no M/R/G Lungs:  Bilateral diminished air entry, coarse breath sounds, crepitus + Abdomen:  Soft, nontender, nondistended, bowel sounds present Musculoskeletal:  Moves all extremities, no pedal edema Skin:  No rash    ASSESSMENT AND PLAN  PULMONARY  Lab 03/02/12 0343 03/01/12 2057  PHART 7.401 7.377  PCO2ART 42.8 51.8*  PO2ART 72.0* 198.0*  HCO3 26.6* 29.7*  O2SAT 94.0 100.0   Ventilator Settings: Vent Mode:  [-] PRVC FiO2 (%):  [39.8 %-100 %] 40.1 % Set Rate:  [18 bmp] 18 bmp Vt Set:  [450 mL] 450 mL PEEP:  [4.9 cmH20-5 cmH20] 4.9 cmH20 Pressure Support:  [5 cmH20] 5 cmH20 Plateau Pressure:  [15 cmH20-25 cmH20] 25 cmH20 CXR:  Left lung infiltrates suggestive of pneumonia. LLL consolidation on chest CT ETT:  Adequate position.  A:   1) Pneumonia / sepsis. Likely  aspiration event. 2) Acute hypoxemic respiratory failure. 3) Subcutaneous emphysema likely to trauma during intubation. Chest and neck CT showed pneumomediastinum, pneumopericardium, Esophageal perforation possible but unlikely P:   - Zosyn and vancomycin - Mechanical ventilation   - PRVC, Vt 6 cc/kg, PEEP of 5, RR: 18, FiO2 to keep O2 sat >94% - VAP order set  CARDIOVASCULAR  Lab 03/02/12 0550 03/02/12 0345 03/01/12 2027  TROPONINI 0.81* -- --  LATICACIDVEN -- 2.3* 4.5*  PROBNP -- -- --   Lines: Peripheral lines  A:  1) Mildly elevated troponin P:  -  follow serial cardiac enzymes   RENAL  Lab 03/02/12 0345 03/01/12  2022 03/01/12 1959  NA 146* 143 145  K 3.7 3.9 --  CL 105 104 100  CO2 24 -- 29  BUN 47* 42* 39*  CREATININE 2.33* 2.10* 2.04*  CALCIUM 8.0* -- 9.0  MG 2.0 -- --  PHOS 3.4 -- --   Intake/Output      07/20 0701 - 07/21 0700 07/21 0701 - 07/22 0700   I.V. (mL/kg) 882.5 (14.4) 750 (12.3)   IV Piggyback 1800    Total Intake(mL/kg) 2682.5 (43.8) 750 (12.3)   Urine (mL/kg/hr) 65 (0) 80 (0.2)   Total Output 65 80   Net  +2617.5 +670         Foley:  Placed today  A:   1) Acute renal failure, likely pre renal  P:   - Fluid resuscitation - Follow urine lytes, ct IVF @ 125/h  GASTROINTESTINAL  Lab 03/01/12 1959  AST 5010*  ALT 3160*  ALKPHOS 95  BILITOT 0.6  PROT 6.7  ALBUMIN 3.0*    A:   1) Elevated LFT's. Likely shock liver P:   - Will follow LFT's  HEMATOLOGIC  Lab 03/02/12 0345 03/01/12 2022 03/01/12 1959  HGB 12.8 15.0 14.4  HCT 38.4 44.0 44.0  PLT 133* -- 161  INR -- -- 1.40  APTT -- -- 33   A:   No issues P:  Follow up CBC  INFECTIOUS  Lab 03/02/12 0345 03/01/12 1959  WBC 17.4* 10.7*  PROCALCITON -- 17.31   Cultures: 7/21 bld >> 7/21 resp >> 7/21 urine >> Antibiotics: Zosyn  7/20 >>> vancomycin 03/01/12  A:   1) Sepsis likely secondary to pneumonia P:   - Sepsis protocol - Antibiotics as above - Will follow cultures  ENDOCRINE  Lab 03/02/12 1123 03/02/12 0740 03/02/12 0344  GLUCAP 123* 148* 160*   A:   - No issues  P:   - POCT glucose monitoring  NEUROLOGIC  A:   1) Unresponsive with no sedation. CT scan of the head with no acute intracranial process. Concern for anoxic brain injury.   2) Drug screen is negative P:   1) No sedation  2) We will continue to monitor mental status. May need MRI  Vs rpt CT for delayed stroke if remains unresponsive in 24h.  BEST PRACTICE / DISPOSITION - Level of Care:  ICU - Primary Service:  CCM - Consultants:  None - Code Status:  DNR - Diet:  NPO - DVT Px:  Heparin - GI Px:  Protonix - Skin Integrity:  Intact - Social / Family:  Grandson updated at bedside.  The patient is critically ill with multiple organ systems failure and requires high complexity decision making for assessment and support, frequent evaluation and titration of therapies, application of advanced monitoring technologies and extensive interpretation of multiple databases.   Critical Care Time devoted to patient care services described in  this note is: 35 m  Cyril Mourning MD. Tonny Bollman. [place] [facility] Pager [phone] If no response call [phone]    03/02/2012, 2:11 PM

## 2012-03-02 NOTE — Progress Notes (Signed)
ETT tube pulled back 2cm per MD, now at 20.

## 2012-03-02 NOTE — Progress Notes (Signed)
eLink Physician-Brief Progress Note Patient Name: [name] [name] DOB: 09-May-1929 MRN: [id number]  Date of Service  03/02/2012   HPI/Events of Note  Patient with ongoing hypotension on no sedatives.  Current BP of 79/41 (50).  Currently receiving fluid bolus of 500 cc.   eICU Interventions  Plan: Start NEO for BP support Aline for BP monitoring   Intervention Category Major Interventions: Hypotension - evaluation and management  DETERDING,ELIZABETH 03/02/2012, 2:06 AM

## 2012-03-03 ENCOUNTER — Inpatient Hospital Stay (HOSPITAL_COMMUNITY): Payer: Medicare Other

## 2012-03-03 ENCOUNTER — Encounter (HOSPITAL_COMMUNITY): Payer: Self-pay | Admitting: *Deleted

## 2012-03-03 DIAGNOSIS — R7989 Other specified abnormal findings of blood chemistry: Secondary | ICD-10-CM

## 2012-03-03 DIAGNOSIS — J690 Pneumonitis due to inhalation of food and vomit: Secondary | ICD-10-CM

## 2012-03-03 DIAGNOSIS — J982 Interstitial emphysema: Secondary | ICD-10-CM | POA: Diagnosis present

## 2012-03-03 DIAGNOSIS — G9340 Encephalopathy, unspecified: Secondary | ICD-10-CM | POA: Diagnosis present

## 2012-03-03 LAB — GLUCOSE, CAPILLARY: Glucose-Capillary: 107 mg/dL — ABNORMAL HIGH (ref 70–99)

## 2012-03-03 LAB — BASIC METABOLIC PANEL
BUN: 51 mg/dL — ABNORMAL HIGH (ref 6–23)
CO2: 22 mEq/L (ref 19–32)
Chloride: 110 mEq/L (ref 96–112)
Creatinine, Ser: 1.55 mg/dL — ABNORMAL HIGH (ref 0.50–1.10)

## 2012-03-03 LAB — CBC
HCT: 37.7 % (ref 36.0–46.0)
Hemoglobin: 12.4 g/dL (ref 12.0–15.0)
MCHC: 32.9 g/dL (ref 30.0–36.0)
MCV: 92 fL (ref 78.0–100.0)
RDW: 16.3 % — ABNORMAL HIGH (ref 11.5–15.5)

## 2012-03-03 LAB — URINE CULTURE

## 2012-03-03 LAB — POCT I-STAT 3, ART BLOOD GAS (G3+)
O2 Saturation: 97 %
TCO2: 26 mmol/L (ref 0–100)
pCO2 arterial: 47.5 mmHg — ABNORMAL HIGH (ref 35.0–45.0)
pO2, Arterial: 93 mmHg (ref 80.0–100.0)

## 2012-03-03 MED ORDER — ASPIRIN 300 MG RE SUPP
300.0000 mg | Freq: Every day | RECTAL | Status: DC
  Administered 2012-03-03 – 2012-03-06 (×4): 300 mg via RECTAL
  Filled 2012-03-03 (×5): qty 1

## 2012-03-03 MED ORDER — POTASSIUM CHLORIDE 10 MEQ/100ML IV SOLN
INTRAVENOUS | Status: AC
  Administered 2012-03-03: 10 meq
  Filled 2012-03-03: qty 100

## 2012-03-03 MED ORDER — POTASSIUM CHLORIDE 10 MEQ/100ML IV SOLN: 10.0000 meq | INTRAVENOUS | Status: DC

## 2012-03-03 MED ORDER — POTASSIUM CHLORIDE 10 MEQ/100ML IV SOLN
10.0000 meq | INTRAVENOUS | Status: AC
  Administered 2012-03-03 (×3): 10 meq via INTRAVENOUS
  Filled 2012-03-03: qty 200
  Filled 2012-03-03: qty 100

## 2012-03-03 MED ORDER — POTASSIUM CHLORIDE 10 MEQ/50ML IV SOLN: 10.0000 meq | INTRAVENOUS | Status: DC

## 2012-03-03 MED ORDER — POTASSIUM CHLORIDE 10 MEQ/100ML IV SOLN
10.0000 meq | INTRAVENOUS | Status: DC
  Administered 2012-03-03: 10 meq via INTRAVENOUS

## 2012-03-03 MED ORDER — METOPROLOL TARTRATE 1 MG/ML IV SOLN
5.0000 mg | Freq: Three times a day (TID) | INTRAVENOUS | Status: DC
  Administered 2012-03-03 – 2012-03-05 (×6): 5 mg via INTRAVENOUS
  Filled 2012-03-03 (×9): qty 5

## 2012-03-03 NOTE — Progress Notes (Signed)
Cycle weaning again 10/5.

## 2012-03-03 NOTE — Significant Event (Signed)
0900am-Spoken with MD Vassie Loll this morning during his rounds regarding unsuccessful placement of a panda last night by night RN. Per MD to hold placement of panda until CT. Will monitor. Aundreya Souffrant, Charity fundraiser.

## 2012-03-03 NOTE — Progress Notes (Signed)
Attempted to place panda feeding tube per MD order; attempts made in both nares with resistance met in both nares, was able to advance into the right nare with failure to enter the gastric system; upon withdrawal, blood was noted on the feeding tube and Pt noted to be receiving heparin SQ for DVT prophylaxis. Will defer placement back to the ordering MD for possible floroscopic placement

## 2012-03-03 NOTE — Progress Notes (Signed)
eLink Physician-Brief Progress Note Patient Name: [name] [name] DOB: 09/15/1928 MRN: [id number]  Date of Service  03/03/2012   HPI/Events of Note   RN unable to place panda  eICU Interventions  D/t ?esophageal tear, will not place OG for now Bedside MD to reassess in AM   Intervention Category Major Interventions: Other:  Shan Levans 03/03/2012, 4:01 PM

## 2012-03-03 NOTE — Progress Notes (Signed)
eLink Physician-Brief Progress Note Patient Name: [name] [name] DOB: October 03, 1928 MRN: [id number]  Date of Service  03/03/2012   HPI/Events of Note  Hypokalemia  eICU Interventions  Potassium replaced   Intervention Category Intermediate Interventions: Electrolyte abnormality - evaluation and management  DETERDING,ELIZABETH 03/03/2012, 4:48 AM

## 2012-03-03 NOTE — Progress Notes (Signed)
Returned with pt from CT. No complications on transport. Pt tol well. Pt remains on same vent settings full support PRVC/VT 450/RR 18/Peep 5/40%.

## 2012-03-03 NOTE — Significant Event (Signed)
1600-spoken with MD Delford Field in [place] regarding need to place panda. Made MD aware attempts were made and unsuccessful by night RN. Suggested panda under fluoro. Per MD, hold placement of panda and OG for now. Will monitor. Roberta Angell, Charity fundraiser.

## 2012-03-03 NOTE — Progress Notes (Signed)
Name: [name] [name] MRN: [id number] DOB: 01-04-1929    LOS: 2  PULMONARY / CRITICAL CARE MEDICINE  HPI:  78 year old female with PMH relevant for HTN, hypothyroidism and CAD with history of MI. She recently moved from [place] about a week ago. She is living by herself with close care from her grandson Edwinna Areola). She was last fine 24h prior to admission. At that time she reported to her grandson that she was feeling warm. ON 7/20  she did not respond the phone and her grandson found her in bed, unresponsive, pale with blue coloration, breathing spontaneously. He called 911 and the patient was intubated on the field and brought to the ED. Purulent secretions were aspirated from ET tube. In the ED the patient developed subcutaneous emphysema likely secondary to tracheal trauma during intubation. Head CT neg . Pt vomited with ET tube placement.   Power of attorney is Gordy Savers in [place] [phone]. The patient is DNR.  EVENTS : 7/21 - CT chest Extensive subcutaneous emphysema in the neck and anterior chest wall. Diffuse pneumomediastinum and pneumopericardium. No definite pneumothorax. Consider esophageal rupture. Consolidation in the left lower lung 7/21 sepsis protocol  Current status : Off pressors, hypertensive, no obvious pain  Secretions +, mild agitation this am   Vital Signs: Temp:  [98.1 F (36.7 C)-99.5 F (37.5 C)] 98.1 F (36.7 C) (07/22 0749) Pulse Rate:  [64-94] 64  (07/22 0900) Resp:  [14-26] 14  (07/22 0900) BP: (92-190)/(47-82) 116/66 mmHg (07/22 0900) SpO2:  [94 %-100 %] 99 % (07/22 0900) Arterial Line BP: (104-174)/(50-85) 138/61 mmHg (07/22 0900) FiO2 (%):  [39.6 %-40.2 %] 40 % (07/22 0900)  Physical Examination: General:  Intubated, mechanically ventilated, no acute distress Neuro:  Opens eyes, synchronous, follows commands, decreased movements LUE/ LLE HEENT:  PERRL, pink conjunctivae, moist membranes Neck:  Supple, no JVD     Cardiovascular:  RRR, no M/R/G Lungs:  Bilateral diminished air entry, coarse breath sounds, crepitus + Abdomen:  Soft, nontender, nondistended, bowel sounds present Musculoskeletal:  Moves all extremities, no pedal edema Skin:  No rash    ASSESSMENT AND PLAN  PULMONARY  Lab 03/02/12 0343 03/01/12 2057  PHART 7.401 7.377  PCO2ART 42.8 51.8*  PO2ART 72.0* 198.0*  HCO3 26.6* 29.7*  O2SAT 94.0 100.0   Ventilator Settings: Vent Mode:  [-] CPAP FiO2 (%):  [39.6 %-40.2 %] 40 % Set Rate:  [18 bmp] 18 bmp Vt Set:  [450 mL] 450 mL PEEP:  [4.9 cmH20-5 cmH20] 5 cmH20 Pressure Support:  [5 cmH20-10 cmH20] 10 cmH20 Plateau Pressure:  [17 cmH20-25 cmH20] 17 cmH20 CXR:  LLL pneumonia, small lt apical pnthx -not convincing ETT:  Adequate position.  A:   1) Pneumonia / sepsis. Likely  aspiration event. 2) Acute hypoxemic respiratory failure. 3) Subcutaneous emphysema likely to trauma during intubation. Chest and neck CT showed pneumomediastinum, pneumopericardium, Esophageal perforation possible but unlikely P:   - Zosyn and vancomycin - SBTs - hope to extubate soon   CARDIOVASCULAR  Lab 03/02/12 2141 03/02/12 1341 03/02/12 0550 03/02/12 0345 03/01/12 2027  TROPONINI 0.53* 0.60* 0.81* -- --  LATICACIDVEN -- -- -- 2.3* 4.5*  PROBNP -- -- -- -- --   Lines: Peripheral lines  A:  1) Mildly elevated troponin P:  -  ASA -low dose metoprolol 5 IV q 8 (on 100 at home)   RENAL  Lab 03/03/12 0330 03/02/12 0345 03/01/12 2022 03/01/12 1959  NA 144 146* 143 145  K 3.0* 3.7 -- --  CL 110 105 104 100  CO2 22 24 -- 29  BUN 51* 47* 42* 39*  CREATININE 1.55* 2.33* 2.10* 2.04*  CALCIUM 7.7* 8.0* -- 9.0  MG -- 2.0 -- --  PHOS -- 3.4 -- --   Intake/Output      07/21 0701 - 07/22 0700 07/22 0701 - 07/23 0700   I.V. (mL/kg) 2565 (41.9) 125 (2)   IV Piggyback 300 200   Total Intake(mL/kg) 2865 (46.8) 325 (5.3)   Urine (mL/kg/hr) 535 (0.4) 100   Total Output 535 100   Net +2330  +225         Foley:  7/20  A:   1) Acute renal failure, FENa c/w pre renal , UO improving Hypokalemia P:   - ct IVF @ 125/h - replte K   GASTROINTESTINAL  Lab 03/01/12 1959  AST 5010*  ALT 3160*  ALKPHOS 95  BILITOT 0.6  PROT 6.7  ALBUMIN 3.0*    A:   1) Elevated LFT's. Likely shock liver P:   - Will follow LFT's  HEMATOLOGIC  Lab 03/03/12 0330 03/02/12 0345 03/01/12 2022 03/01/12 1959  HGB 12.4 12.8 15.0 14.4  HCT 37.7 38.4 44.0 44.0  PLT 101* 133* -- 161  INR -- -- -- 1.40  APTT -- -- -- 33   A:   No issues P:  Follow up CBC  INFECTIOUS  Lab 03/03/12 0330 03/02/12 0345 03/01/12 1959  WBC 20.7* 17.4* 10.7*  PROCALCITON 32.25 -- 17.31   Cultures: 7/21 bld >>ng 7/21 resp >> 7/21 urine >> Antibiotics: Zosyn  7/20 >>> vancomycin 03/01/12  A:   1) Sepsis likely secondary to pneumonia P:   - Off Sepsis protocol - Antibiotics as above - Will follow cultures & simplify  ENDOCRINE  Lab 03/02/12 2048 03/02/12 1123 03/02/12 0740 03/02/12 0344  GLUCAP 112* 123* 148* 160*   A:   - No issues  P:   - POCT glucose monitoring  NEUROLOGIC  A:   1) Unresponsive with no sedation. CT scan of the head with no acute intracranial process. Concern for anoxic brain injury.   2) Drug screen is negative P:   1) No sedation  2)  rpt CT for delayed stroke , decreased movement on left  BEST PRACTICE / DISPOSITION - Level of Care:  ICU - Primary Service:  CCM - Consultants:  None - Code Status:  DNR - Diet:  NPO - DVT Px:  Heparin - GI Px:  Protonix - Skin Integrity:  Intact - Social / Family:  Grandson updated at bedside 7/21, Limited code issued  The patient is critically ill with multiple organ systems failure and requires high complexity decision making for assessment and support, frequent evaluation and titration of therapies, application of advanced monitoring technologies and extensive interpretation of multiple databases.   Critical Care Time  devoted to patient care services described in this note is: 66 m  Cyril Mourning MD. Tonny Bollman. [place] [facility] Pager [phone] If no response call [phone]    03/03/2012, 9:57 AM

## 2012-03-03 NOTE — Progress Notes (Signed)
ETT withdrawn 2 cm per Dr. Vassie Loll to 20.

## 2012-03-04 ENCOUNTER — Inpatient Hospital Stay (HOSPITAL_COMMUNITY): Payer: Medicare Other

## 2012-03-04 DIAGNOSIS — J189 Pneumonia, unspecified organism: Secondary | ICD-10-CM | POA: Diagnosis present

## 2012-03-04 LAB — COMPREHENSIVE METABOLIC PANEL
ALT: 1649 U/L — ABNORMAL HIGH (ref 0–35)
Albumin: 2.1 g/dL — ABNORMAL LOW (ref 3.5–5.2)
Alkaline Phosphatase: 164 U/L — ABNORMAL HIGH (ref 39–117)
BUN: 42 mg/dL — ABNORMAL HIGH (ref 6–23)
Chloride: 117 mEq/L — ABNORMAL HIGH (ref 96–112)
Glucose, Bld: 103 mg/dL — ABNORMAL HIGH (ref 70–99)
Potassium: 3.6 mEq/L (ref 3.5–5.1)
Sodium: 151 mEq/L — ABNORMAL HIGH (ref 135–145)
Total Bilirubin: 0.5 mg/dL (ref 0.3–1.2)

## 2012-03-04 LAB — GLUCOSE, CAPILLARY
Glucose-Capillary: 89 mg/dL (ref 70–99)
Glucose-Capillary: 89 mg/dL (ref 70–99)

## 2012-03-04 LAB — CBC
HCT: 36.8 % (ref 36.0–46.0)
Hemoglobin: 12.1 g/dL (ref 12.0–15.0)
RDW: 16.5 % — ABNORMAL HIGH (ref 11.5–15.5)
WBC: 22.1 10*3/uL — ABNORMAL HIGH (ref 4.0–10.5)

## 2012-03-04 LAB — CULTURE, RESPIRATORY W GRAM STAIN

## 2012-03-04 MED ORDER — SODIUM CHLORIDE 0.45 % IV SOLN
INTRAVENOUS | Status: DC
  Administered 2012-03-04: 10:00:00 via INTRAVENOUS

## 2012-03-04 MED ORDER — ACETYLCYSTEINE 10 % IN SOLN
4.0000 mL | Freq: Two times a day (BID) | RESPIRATORY_TRACT | Status: DC
  Filled 2012-03-04 (×3): qty 4

## 2012-03-04 MED ORDER — PIPERACILLIN-TAZOBACTAM 3.375 G IVPB
3.3750 g | Freq: Three times a day (TID) | INTRAVENOUS | Status: DC
  Administered 2012-03-04 – 2012-03-05 (×3): 3.375 g via INTRAVENOUS
  Filled 2012-03-04 (×7): qty 50

## 2012-03-04 MED ORDER — LEVOTHYROXINE SODIUM 175 MCG PO TABS
175.0000 ug | ORAL_TABLET | Freq: Every day | ORAL | Status: DC
  Administered 2012-03-04 – 2012-03-13 (×10): 175 ug via ORAL
  Filled 2012-03-04 (×11): qty 1

## 2012-03-04 MED ORDER — JEVITY 1.2 CAL PO LIQD
1000.0000 mL | ORAL | Status: DC
  Administered 2012-03-04: 16:00:00
  Filled 2012-03-04 (×3): qty 1000

## 2012-03-04 MED ORDER — ALBUTEROL SULFATE (5 MG/ML) 0.5% IN NEBU
2.5000 mg | INHALATION_SOLUTION | Freq: Four times a day (QID) | RESPIRATORY_TRACT | Status: DC
  Administered 2012-03-04 – 2012-03-13 (×30): 2.5 mg via RESPIRATORY_TRACT
  Filled 2012-03-04 (×32): qty 0.5

## 2012-03-04 MED ORDER — IPRATROPIUM BROMIDE 0.02 % IN SOLN
0.5000 mg | Freq: Four times a day (QID) | RESPIRATORY_TRACT | Status: DC
  Administered 2012-03-04 – 2012-03-13 (×30): 0.5 mg via RESPIRATORY_TRACT
  Filled 2012-03-04 (×32): qty 2.5

## 2012-03-04 MED ORDER — JEVITY 1.2 CAL PO LIQD
1000.0000 mL | ORAL | Status: DC
  Filled 2012-03-04 (×2): qty 1000

## 2012-03-04 MED ORDER — ACETYLCYSTEINE 20 % IN SOLN
4.0000 mL | Freq: Two times a day (BID) | RESPIRATORY_TRACT | Status: AC
  Administered 2012-03-04 – 2012-03-05 (×4): 4 mL via RESPIRATORY_TRACT
  Filled 2012-03-04 (×4): qty 4

## 2012-03-04 MED ORDER — PRO-STAT SUGAR FREE PO LIQD
30.0000 mL | Freq: Two times a day (BID) | ORAL | Status: DC
  Administered 2012-03-04 – 2012-03-09 (×10): 30 mL
  Filled 2012-03-04 (×11): qty 30

## 2012-03-04 MED ORDER — ADULT MULTIVITAMIN LIQUID CH
5.0000 mL | Freq: Every day | ORAL | Status: DC
  Administered 2012-03-04 – 2012-03-09 (×6): 5 mL
  Filled 2012-03-04 (×6): qty 5

## 2012-03-04 MED ORDER — AMLODIPINE BESYLATE 5 MG PO TABS
5.0000 mg | ORAL_TABLET | Freq: Every day | ORAL | Status: DC
  Administered 2012-03-04 – 2012-03-06 (×3): 5 mg via ORAL
  Filled 2012-03-04 (×4): qty 1

## 2012-03-04 MED ORDER — VANCOMYCIN HCL IN DEXTROSE 1-5 GM/200ML-% IV SOLN
1000.0000 mg | INTRAVENOUS | Status: DC
  Administered 2012-03-04: 1000 mg via INTRAVENOUS
  Filled 2012-03-04 (×2): qty 200

## 2012-03-04 MED ORDER — IOHEXOL 300 MG/ML  SOLN
15.0000 mL | Freq: Once | INTRAMUSCULAR | Status: AC | PRN
  Administered 2012-03-04: 15 mL

## 2012-03-04 MED ORDER — ATORVASTATIN CALCIUM 10 MG PO TABS
10.0000 mg | ORAL_TABLET | Freq: Every day | ORAL | Status: DC
  Administered 2012-03-04 – 2012-03-12 (×9): 10 mg via ORAL
  Filled 2012-03-04 (×11): qty 1

## 2012-03-04 MED ORDER — TRAMADOL HCL 50 MG PO TABS
50.0000 mg | ORAL_TABLET | Freq: Four times a day (QID) | ORAL | Status: DC | PRN
  Administered 2012-03-04: 50 mg via ORAL
  Filled 2012-03-04: qty 1

## 2012-03-04 NOTE — Progress Notes (Signed)
ANTIBIOTIC CONSULT NOTE - INITIAL  Pharmacy Consult for Vancocin/Zosyn Indication: rule out pneumonia and rule out sepsis  No Known Allergies  Patient Measurements: Height: 5\' 1"  (154.9 cm) Weight: 139 lb 5.3 oz (63.2 kg) (bed weight scale, one pillow) IBW/kg (Calculated) : 47.8   Vital Signs: Temp: 98.1 F (36.7 C) (07/23 0750) Temp src: Oral (07/23 0750) BP: 148/70 mmHg (07/23 1100) Pulse Rate: 86  (07/23 1100)  Labs:  Basename 03/04/12 0355 03/03/12 0330 03/02/12 1437 03/02/12 0345  WBC 22.1* 20.7* -- 17.4*  HGB 12.1 12.4 -- 12.8  PLT 120* 101* -- 133*  LABCREA -- -- 149.6 --  CREATININE 1.04 1.55* -- 2.33*   Estimated Creatinine Clearance: 36.2 ml/min (by C-G formula based on Cr of 1.04).  Microbiology: Recent Results (from the past 720 hour(s))  URINE CULTURE     Status: Normal   Collection Time   03/01/12  8:17 PM      Component Value Range Status Comment   Specimen Description URINE, CATHETERIZED   Final    Special Requests NONE   Final    Culture  Setup Time 03/02/2012 05:28   Final    Colony Count 35,000 COLONIES/ML   Final    Culture     Final    Value: DIPHTHEROIDS(CORYNEBACTERIUM SPECIES)     Note: Standardized susceptibility testing for this organism is not available.   Report Status 03/03/2012 FINAL   Final   CULTURE, BLOOD (ROUTINE X 2)     Status: Normal (Preliminary result)   Collection Time   03/01/12  8:40 PM      Component Value Range Status Comment   Specimen Description BLOOD LEFT HAND   Final    Special Requests BOTTLES DRAWN AEROBIC ONLY 2CC   Final    Culture  Setup Time 03/02/2012 04:58   Final    Culture     Final    Value: GRAM NEGATIVE COCCOBACILLI     Note: Gram Stain Report Called to,Read Back By and Verified With: KELSEY PATMAN 03/04/12 0900 BY SMITHERSJ   Report Status PENDING   Incomplete   CULTURE, BLOOD (ROUTINE X 2)     Status: Normal (Preliminary result)   Collection Time   03/01/12  8:50 PM      Component Value Range Status  Comment   Specimen Description BLOOD RIGHT HAND   Final    Special Requests BOTTLES DRAWN AEROBIC ONLY 5CC   Final    Culture  Setup Time 03/02/2012 04:59   Final    Culture     Final    Value:        BLOOD CULTURE RECEIVED NO GROWTH TO DATE CULTURE WILL BE HELD FOR 5 DAYS BEFORE ISSUING A FINAL NEGATIVE REPORT   Report Status PENDING   Incomplete   CULTURE, RESPIRATORY     Status: Normal   Collection Time   03/02/12  1:12 AM      Component Value Range Status Comment   Specimen Description ENDOTRACHEAL ASPIRATE   Final    Special Requests NONE   Final    Gram Stain     Final    Value: MODERATE WBC PRESENT,BOTH PMN AND MONONUCLEAR     NO SQUAMOUS EPITHELIAL CELLS SEEN     FEW GRAM NEGATIVE COCCOBACILLI   Culture     Final    Value: ABUNDANT HAEMOPHILUS INFLUENZAE     Note: BETA LACTAMASE NEGATIVE   Report Status 03/04/2012 FINAL   Final   MRSA  PCR SCREENING     Status: Normal   Collection Time   03/02/12  1:13 AM      Component Value Range Status Comment   MRSA by PCR NEGATIVE  NEGATIVE Final     Medical History: No past medical history on file.  Medications:  Prescriptions prior to admission  Medication Sig Dispense Refill  . amLODipine (NORVASC) 5 MG tablet Take 5 mg by mouth daily.      Marland Kitchen atorvastatin (LIPITOR) 10 MG tablet Take 10 mg by mouth at bedtime.      Marland Kitchen levothyroxine (SYNTHROID, LEVOTHROID) 175 MCG tablet Take 175 mcg by mouth daily.      . metoprolol (LOPRESSOR) 100 MG tablet Take 100 mg by mouth daily.      . traMADol (ULTRAM) 50 MG tablet Take 50 mg by mouth every 6 (six) hours as needed. pain      . Vitamin D, Ergocalciferol, (DRISDOL) 50000 UNITS CAPS Take 50,000 Units by mouth 2 (two) times a week.       Scheduled:     . acetylcysteine  4 mL Nebulization BID  . ipratropium  0.5 mg Nebulization Q6H   And  . albuterol  2.5 mg Nebulization Q6H  . antiseptic oral rinse  15 mL Mouth Rinse QID  . aspirin  300 mg Rectal Daily  . chlorhexidine  15 mL Mouth  Rinse BID  . heparin  5,000 Units Subcutaneous Q8H  . metoprolol  5 mg Intravenous Q8H  . pantoprazole (PROTONIX) IV  40 mg Intravenous QHS  . piperacillin-tazobactam (ZOSYN)  IV  3.375 g Intravenous Q8H  . vancomycin  1,000 mg Intravenous Q24H  . DISCONTD: acetylcysteine  4 mL Nebulization BID  . DISCONTD: piperacillin-tazobactam (ZOSYN)  IV  2.25 g Intravenous Q6H  . DISCONTD: vancomycin  500 mg Intravenous Q48H    Assessment: 78yo female admitted 7/20 for PNA/sepsis. Afeb, WBC up 22.1, covering empirically with vanc and zosyn. Crcl improved (Scr decreased to 1.04).   Vanc 7/20>>7/20, 7/23>> Zosyn 7/20>>  7/20 Urine: likely contaminant  7/20 Blood x 2: Gram neg coccobacilli 1/2 7/21 Sputum: Few gram neg, abundant H. influ reported  MRSA PCR neg   Goal of Therapy:  Vancomycin trough level 15-20 mcg/ml  Plan:  1) Change vancomycin regimen to 1g IV q24h. 2)  Monitor renal function and urine output.  2) Vanc trough prior to 4th dose of new regimen 2) Change zosyn dose to 3.375 gm IV q8h (4 hour infusion)  Charon Smedberg, Pharm.D. Clinical Pharmacist   Pager: [phone] Phone: [phone] 03/04/2012 11:33 AM

## 2012-03-04 NOTE — Progress Notes (Signed)
I have read and agree with above assessment.

## 2012-03-04 NOTE — Progress Notes (Signed)
INITIAL ADULT NUTRITION ASSESSMENT Date: 03/04/2012   Time: 12:40 PM  INTERVENTION:  Initiate Jevity 1.2 at 15 ml/hr and increase by 10 ml every 4 hours until goal rate of 35 ml/hr reached with Prostat liquid protein 30 ml twice daily via tube to provide 1208 total kcals, 77 gm protein, 678 ml of free water  Liquid MVI daily via tube RD to follow for nutrition care plan  Reason for Assessment: Consult  ASSESSMENT: Female 78 y.o.  Dx: acute hypoxemic respiratory failure, PNA/sepsis  Hx: No past medical history on file.  Related Meds:     . acetylcysteine  4 mL Nebulization BID  . ipratropium  0.5 mg Nebulization Q6H   And  . albuterol  2.5 mg Nebulization Q6H  . amLODipine  5 mg Oral Daily  . antiseptic oral rinse  15 mL Mouth Rinse QID  . aspirin  300 mg Rectal Daily  . atorvastatin  10 mg Oral QHS  . chlorhexidine  15 mL Mouth Rinse BID  . heparin  5,000 Units Subcutaneous Q8H  . levothyroxine  175 mcg Oral Daily  . metoprolol  5 mg Intravenous Q8H  . pantoprazole (PROTONIX) IV  40 mg Intravenous QHS  . piperacillin-tazobactam (ZOSYN)  IV  3.375 g Intravenous Q8H  . vancomycin  1,000 mg Intravenous Q24H  . DISCONTD: acetylcysteine  4 mL Nebulization BID  . DISCONTD: piperacillin-tazobactam (ZOSYN)  IV  2.25 g Intravenous Q6H  . DISCONTD: vancomycin  500 mg Intravenous Q48H    Ht: 5\' 1"  (154.9 cm)  Wt: 139 lb 5.3 oz (63.2 kg) (bed weight scale, one pillow)  Ideal Wt: 47.7 kg % Ideal Wt: 132%  Usual Wt: unable to obtain % Usual Wt: ---  Body mass index is 26.33 kg/(m^2).  Food/Nutrition Related Hx: admission nutrition screen incomplete  Labs:  CMP     Component Value Date/Time   NA 151* 03/04/2012 0355   K 3.6 03/04/2012 0355   CL 117* 03/04/2012 0355   CO2 23 03/04/2012 0355   GLUCOSE 103* 03/04/2012 0355   BUN 42* 03/04/2012 0355   CREATININE 1.04 03/04/2012 0355   CALCIUM 7.9* 03/04/2012 0355   PROT 5.4* 03/04/2012 0355   ALBUMIN 2.1* 03/04/2012 0355   AST 774* 03/04/2012 0355   ALT 1649* 03/04/2012 0355   ALKPHOS 164* 03/04/2012 0355   BILITOT 0.5 03/04/2012 0355   GFRNONAA 49* 03/04/2012 0355   GFRAA 57* 03/04/2012 0355     Intake/Output Summary (Last 24 hours) at 03/04/12 1241 Last data filed at 03/04/12 1200  Gross per 24 hour  Intake 3078.33 ml  Output   1245 ml  Net 1833.33 ml    CBG (last 3)   Basename 03/04/12 1133 03/04/12 0749 03/04/12 0406  GLUCAP 87 97 89    Diet Order: NPO  Supplements/Tube Feeding: Jevity 1.2 formula ordered per Adult Enteral Nutrition Protocol  IVF:    sodium chloride Last Rate: 50 mL/hr at 03/04/12 1200  feeding supplement (JEVITY 1.2 CAL)   DISCONTD: sodium chloride Last Rate: 125 mL/hr at 03/04/12 0900    Estimated Nutritional Needs:   Kcal: 1150-1250 Protein: 75-85 gm Fluid: > 1.5 L  RD unable to obtain nutrition hx from patient; intubated & sedated; was found by her Grandson in bed, unresponsive with spontaneous breathing; in ED developed subcutaneous emphysema likely secondary to tracheal trauma during intubation; Panda tube in place -- tip in distal duodenum; off sepsis protocol; noted patient with Stage I wound to L knee & R  leg, Stage II wound to L lateral hip area; RD consulted to initiate & manage EN  NUTRITION DIAGNOSIS: -Inadequate oral intake (NI-2.1).  Status: Ongoing  RELATED TO: inability to eat  AS EVIDENCE BY: NPO status  MONITORING/EVALUATION(Goals): Goal: EN to meet >/= 90% of estimated nutrition needs Monitor: EN regimen & tolerance, respiratory status, weight, labs, I/O's  EDUCATION NEEDS: -No education needs identified at this time  Dietitian #: [phone]  DOCUMENTATION CODES Per approved criteria  -Not Applicable    Alger Memos 03/04/2012, 12:40 PM

## 2012-03-04 NOTE — Progress Notes (Signed)
CRITICAL VALUE ALERT  Critical value received: positive blood cultures, aerobic gram negative coccobacilli  Date of notification: 7/22  Time of notification: 0900  Critical value read back:yes  Nurse who received alert:  Adelina Mings RN   MD notified (1st page):  Dr. Vassie Loll  Time of first page: 0900  MD notified (2nd page):  Time of second page:  Responding MD:  Dr. Vassie Loll  Time MD responded: 0900

## 2012-03-04 NOTE — Progress Notes (Signed)
During weaning, pt plugged off with secretions while turning in bed. Pt. Was bagged and lavaged for large amount of thick secretios.

## 2012-03-04 NOTE — Progress Notes (Signed)
Name: [name] [name] MRN: [id number] DOB: [birth year]    LOS: 3  PULMONARY / CRITICAL CARE MEDICINE  HPI:  78 year old female with PMH relevant for HTN, hypothyroidism and CAD with history of MI. She recently moved from [place] about a week ago. She is living by herself with close care from her grandson Edwinna Areola). She was last fine 24h prior to admission. At that time she reported to her grandson that she was feeling warm. ON 7/20  she did not respond the phone and her grandson found her in bed, unresponsive, pale with blue coloration, breathing spontaneously. He called 911 and the patient was intubated on the field and brought to the ED. Purulent secretions were aspirated from ET tube. In the ED the patient developed subcutaneous emphysema likely secondary to tracheal trauma during intubation. Head CT neg . Pt vomited with ET tube placement.   Power of attorney is Gordy Savers in [place] [phone]. The patient is DNR.  EVENTS : 7/21 - CT chest Extensive subcutaneous emphysema in the neck and anterior chest wall. Diffuse pneumomediastinum and pneumopericardium. No definite pneumothorax. Consider esophageal rupture. Consolidation in the left lower lung 7/21 sepsis protocol 7/22 rpt head CT neg 7/23 during weaning, large amount of secretions causing plugging   Current status : Off pressors, hypertensive, no obvious pain  Secretions lg amount , thick, mild agitation this am   Vital Signs: Temp:  [97.6 F (36.4 C)-98.6 F (37 C)] 98.1 F (36.7 C) (07/23 0750) Pulse Rate:  [55-91] 73  (07/23 0900) Resp:  [18-30] 19  (07/23 0900) BP: (116-176)/(53-87) 151/66 mmHg (07/23 0900) SpO2:  [95 %-100 %] 95 % (07/23 0900) Arterial Line BP: (158)/(70) 158/70 mmHg (07/22 1700) FiO2 (%):  [39.8 %-100 %] 40.2 % (07/23 0900) Weight:  [63.2 kg (139 lb 5.3 oz)] 63.2 kg (139 lb 5.3 oz) (07/23 0600)  Physical Examination: General:  Intubated, mechanically ventilated, no acute  distress Neuro:  Opens eyes, synchronous, follows commands, improved power LUE/ LLE 3-4/5 HEENT:  PERRL, pink conjunctivae, moist membranes Neck:  Supple, no JVD   Cardiovascular:  RRR, no M/R/G Lungs:  Bilateral diminished air entry, coarse breath sounds, crepitus + Abdomen:  Soft, nontender, nondistended, bowel sounds present Musculoskeletal:  Moves all extremities, no pedal edema Skin:  No rash    ASSESSMENT AND PLAN  PULMONARY  Lab 03/03/12 1044 03/02/12 0343 03/01/12 2057  PHART 7.325* 7.401 7.377  PCO2ART 47.5* 42.8 51.8*  PO2ART 93.0 72.0* 198.0*  HCO3 24.8* 26.6* 29.7*  O2SAT 97.0 94.0 100.0   Ventilator Settings: Vent Mode:  [-] PRVC FiO2 (%):  [39.8 %-100 %] 40.2 % Set Rate:  [18 bmp] 18 bmp Vt Set:  [450 mL] 450 mL PEEP:  [2.1 cmH20-5 cmH20] 2.1 cmH20 Pressure Support:  [5 cmH20-10 cmH20] 5 cmH20 Plateau Pressure:  [16 cmH20-21 cmH20] 21 cmH20 CXR:  LLL pneumonia, no pnthx ETT:  Adequate position.  A:   1) Pneumonia / sepsis. Likely  aspiration event. 2) Acute hypoxemic respiratory failure. 3) Subcutaneous emphysema likely to trauma during intubation. Chest and neck CT showed pneumomediastinum, pneumopericardium, Esophageal perforation possible but unlikely 4? Likely COPD in this heavy smoker P:    - SBTs - hope to extubate soon once secretions better -albuterol/atrovent nebs -mucormyst   CARDIOVASCULAR  Lab 03/02/12 2141 03/02/12 1341 03/02/12 0550 03/02/12 0345 03/01/12 2027  TROPONINI 0.53* 0.60* 0.81* -- --  LATICACIDVEN -- -- -- 2.3* 4.5*  PROBNP -- -- -- -- --  Lines: Peripheral lines  A:  1) Mildly elevated troponin P:  -  ASA -low dose metoprolol 5 IV q 8 (on 100 at home), ideally should be on selective Bb   RENAL  Lab 03/04/12 0355 03/03/12 0330 03/02/12 0345 03/01/12 2022 03/01/12 1959  NA 151* 144 146* 143 145  K 3.6 3.0* -- -- --  CL 117* 110 105 104 100  CO2 23 22 24  -- 29  BUN 42* 51* 47* 42* 39*  CREATININE 1.04 1.55*  2.33* 2.10* 2.04*  CALCIUM 7.9* 7.7* 8.0* -- 9.0  MG -- -- 2.0 -- --  PHOS -- -- 3.4 -- --   Intake/Output      07/22 0701 - 07/23 0700 07/23 0701 - 07/24 0700   I.V. (mL/kg) 2875 (45.5) 250 (4)   IV Piggyback 560    Total Intake(mL/kg) 3435 (54.4) 250 (4)   Urine (mL/kg/hr) 1215 (0.8) 135   Total Output 1215 135   Net +2220 +115         Foley:  7/20  A:   1) Acute renal failure, FENa c/w pre renal , UO improving Hypokalemia Hypernatremia P:   - decrease IVF to 50/h 1/2 NS - replted K   GASTROINTESTINAL  Lab 03/04/12 0355 03/01/12 1959  AST 774* 5010*  ALT 1649* 3160*  ALKPHOS 164* 95  BILITOT 0.5 0.6  PROT 5.4* 6.7  ALBUMIN 2.1* 3.0*    A:   1) Elevated LFT's. Likely shock liver -much improved Doubt esophageal perforation P:   -  follow LFT's q 3ds -PANDA under fluoro, if succesful start TFs -nutrition suffering  HEMATOLOGIC  Lab 03/04/12 0355 03/03/12 0330 03/02/12 0345 03/01/12 2022 03/01/12 1959  HGB 12.1 12.4 12.8 15.0 14.4  HCT 36.8 37.7 38.4 44.0 44.0  PLT 120* 101* 133* -- 161  INR -- -- -- -- 1.40  APTT -- -- -- -- 33   A:   No issues P:  Follow up CBC  INFECTIOUS  Lab 03/04/12 0355 03/03/12 0330 03/02/12 0345 03/01/12 1959  WBC 22.1* 20.7* 17.4* 10.7*  PROCALCITON -- 32.25 -- 17.31   Cultures: 7/21 bld >>G neg coccobacilli 1/2 7/21 resp >>h flu 7/21 urine >>diphtheroids Antibiotics: Zosyn  7/20 >>> vancomycin 03/01/12  A:   1) Sepsis likely secondary to pneumonia P:   - Off Sepsis protocol - ct zosyn, this may be all due to h flu CAP  ENDOCRINE  Lab 03/04/12 0749 03/04/12 0406 03/03/12 2354 03/03/12 1957 03/03/12 1627  GLUCAP 97 89 89 107* 100*   A:   - No issues  P:   - POCT glucose monitoring  NEUROLOGIC  A:   1) Unresponsive with no sedation.   rpt CT neg  for delayed stroke , improved movement on left 2) Drug screen is negative P:   fent prn  BEST PRACTICE / DISPOSITION - Level of Care:  ICU - Primary  Service:  CCM - Consultants:  None - Code Status:  DNR - Diet:  NPO - DVT Px:  Heparin - GI Px:  Protonix - Skin Integrity:  Intact - Social / Family:  Grandson updated at bedside 7/22, Limited code issued  The patient is critically ill with multiple organ systems failure and requires high complexity decision making for assessment and support, frequent evaluation and titration of therapies, application of advanced monitoring technologies and extensive interpretation of multiple databases.   Critical Care Time devoted to patient care services described in this note is: 40 m  Cyril Mourning MD. Tonny Bollman. [facility] Pager [phone] If no response call [phone]    03/04/2012, 9:27 AM

## 2012-03-05 ENCOUNTER — Inpatient Hospital Stay (HOSPITAL_COMMUNITY): Payer: Medicare Other

## 2012-03-05 DIAGNOSIS — R4182 Altered mental status, unspecified: Secondary | ICD-10-CM

## 2012-03-05 DIAGNOSIS — T797XXA Traumatic subcutaneous emphysema, initial encounter: Secondary | ICD-10-CM

## 2012-03-05 LAB — CBC
HCT: 34.3 % — ABNORMAL LOW (ref 36.0–46.0)
Hemoglobin: 11.3 g/dL — ABNORMAL LOW (ref 12.0–15.0)
MCH: 30.4 pg (ref 26.0–34.0)
MCV: 92.2 fL (ref 78.0–100.0)
RBC: 3.72 MIL/uL — ABNORMAL LOW (ref 3.87–5.11)

## 2012-03-05 LAB — GLUCOSE, CAPILLARY
Glucose-Capillary: 100 mg/dL — ABNORMAL HIGH (ref 70–99)
Glucose-Capillary: 135 mg/dL — ABNORMAL HIGH (ref 70–99)
Glucose-Capillary: 158 mg/dL — ABNORMAL HIGH (ref 70–99)
Glucose-Capillary: 172 mg/dL — ABNORMAL HIGH (ref 70–99)

## 2012-03-05 LAB — BASIC METABOLIC PANEL
CO2: 23 mEq/L (ref 19–32)
Calcium: 8 mg/dL — ABNORMAL LOW (ref 8.4–10.5)
Chloride: 115 mEq/L — ABNORMAL HIGH (ref 96–112)
Glucose, Bld: 170 mg/dL — ABNORMAL HIGH (ref 70–99)
Potassium: 3.3 mEq/L — ABNORMAL LOW (ref 3.5–5.1)
Sodium: 149 mEq/L — ABNORMAL HIGH (ref 135–145)

## 2012-03-05 MED ORDER — POTASSIUM CHLORIDE 20 MEQ/15ML (10%) PO LIQD
ORAL | Status: AC
  Administered 2012-03-05: 40 meq
  Filled 2012-03-05: qty 30

## 2012-03-05 MED ORDER — SODIUM CHLORIDE 0.9 % IJ SOLN
10.0000 mL | Freq: Two times a day (BID) | INTRAMUSCULAR | Status: DC
  Administered 2012-03-05: 20 mL
  Administered 2012-03-05 – 2012-03-07 (×4): 10 mL
  Administered 2012-03-08: 30 mL
  Administered 2012-03-08 – 2012-03-10 (×4): 20 mL
  Administered 2012-03-10: 10 mL
  Administered 2012-03-11: 20 mL
  Filled 2012-03-05 (×4): qty 20

## 2012-03-05 MED ORDER — POTASSIUM CHLORIDE 20 MEQ/15ML (10%) PO LIQD
40.0000 meq | Freq: Once | ORAL | Status: AC
  Administered 2012-03-05: 40 meq
  Filled 2012-03-05: qty 30

## 2012-03-05 MED ORDER — INSULIN ASPART 100 UNIT/ML ~~LOC~~ SOLN
0.0000 [IU] | SUBCUTANEOUS | Status: DC
  Administered 2012-03-05: 3 [IU] via SUBCUTANEOUS
  Administered 2012-03-05 – 2012-03-08 (×10): 2 [IU] via SUBCUTANEOUS

## 2012-03-05 MED ORDER — SODIUM CHLORIDE 0.9 % IJ SOLN
10.0000 mL | INTRAMUSCULAR | Status: DC | PRN
  Administered 2012-03-13 (×2): 10 mL
  Filled 2012-03-05 (×2): qty 30

## 2012-03-05 MED ORDER — METOPROLOL TARTRATE 12.5 MG HALF TABLET
12.5000 mg | ORAL_TABLET | Freq: Two times a day (BID) | ORAL | Status: DC
  Administered 2012-03-05 (×2): 12.5 mg via ORAL
  Filled 2012-03-05 (×4): qty 1

## 2012-03-05 MED ORDER — JEVITY 1.2 CAL PO LIQD
1000.0000 mL | ORAL | Status: DC
  Administered 2012-03-06: 35 mL/h
  Filled 2012-03-05 (×5): qty 1000

## 2012-03-05 MED ORDER — DEXTROSE 5 % IV SOLN
2.0000 g | INTRAVENOUS | Status: AC
  Administered 2012-03-05 – 2012-03-11 (×7): 2 g via INTRAVENOUS
  Filled 2012-03-05 (×7): qty 2

## 2012-03-05 MED ORDER — PANTOPRAZOLE SODIUM 40 MG PO PACK
40.0000 mg | PACK | Freq: Every day | ORAL | Status: DC
  Filled 2012-03-05: qty 20

## 2012-03-05 NOTE — Plan of Care (Signed)
Problem: Phase II Progression Outcomes Goal: Date pt extubated/weaned off vent Outcome: Completed/Met Date Met:  03/05/12 7/24 Goal: Time pt extubated/weaned off vent Outcome: Completed/Met Date Met:  03/05/12 1245

## 2012-03-05 NOTE — Progress Notes (Signed)
Pt placed back on full support briefly for PICC insertion. Pt now back on CPAP/PS 5/5 and tolerating well. RT still suctioning moderate amount of thick, tan secretions. RT will continue to monitor.

## 2012-03-05 NOTE — Procedures (Signed)
Extubation Procedure Note  Patient Details:   Name: [name] [name] DOB: 07/07/1929 MRN: [id number]   Airway Documentation:     Evaluation  O2 sats: stable throughout Complications: No apparent complications Patient did tolerate procedure well. Bilateral Breath Sounds: Clear (slightly coarse) Suctioning: Oral;Airway Yes  Pt tolerated vent weaning well. Okay to extubate per MD order. Pt positive for cuff leak, and extubated to 5lpm [place]. Pt tolerated extubation well, no strider heard. Pt resting comfortably at this time. RT will continue to monitor.   Parke Poisson 03/05/2012, 12:55 PM

## 2012-03-05 NOTE — Care Management Note (Unsigned)
    Page 1 of 1   03/05/2012     2:38:00 PM   CARE MANAGEMENT NOTE 03/05/2012  Patient:  [name] [name], [name] [name]   Account Number:  [id number]  Date Initiated:  03/05/2012  Documentation initiated by:  Carilyn Woolston  Subjective/Objective Assessment:   PT ADM 03/01/12 WITH ASPIRATION PNA/RESPIRATORY FAILURE AND SEPSIS.  PTA, PT LIVED ALONE, BUT 5 MINUTES  FROM HER SONS, WHO CHECK ON HER DAILY.     Action/Plan:   MET WITH PT'S SON, DONALD TO DISCUSS DC PLANS.  WILL FOLLOW FOR DC PLANNING AS PT PROGRESSES.  CSW CONSULTED SHOULD SNF PLACEMENT BE NEEDED.   Anticipated DC Date:  03/09/2012   Anticipated DC Plan:  SKILLED NURSING FACILITY  In-house referral  Clinical Social Worker      DC Planning Services  CM consult      Choice offered to / List presented to:             Status of service:  In process, will continue to follow Medicare Important Message given?   (If response is "NO", the following Medicare IM given date fields will be blank) Date Medicare IM given:   Date Additional Medicare IM given:    Discharge Disposition:    Per UR Regulation:  Reviewed for med. necessity/level of care/duration of stay  If discussed at Long Length of Stay Meetings, dates discussed:    Comments:  PT'S SONS, DONALD AND STEVE  03/05/12 Lian Pounds,RN,BSN PT WILL NEED PT/OT CONSULTS WHEN ABLE TO TOLERATE.

## 2012-03-05 NOTE — Progress Notes (Signed)
Peripherally Inserted Central Catheter/Midline Placement  The IV Nurse has discussed with the patient and/or persons authorized to consent for the patient, the purpose of this procedure and the potential benefits and risks involved with this procedure.  The benefits include less needle sticks, lab draws from the catheter and patient may be discharged home with the catheter.  Risks include, but not limited to, infection, bleeding, blood clot (thrombus formation), and puncture of an artery; nerve damage and irregular heat beat.  Alternatives to this procedure were also discussed.  PICC/Midline Placement Documentation        Lisabeth Devoid 03/05/2012, 10:34 AM Consent obtained from grandson.

## 2012-03-05 NOTE — Progress Notes (Signed)
Spoke with Dr. Sung Amabile concerning the need for IV access other than peripheral IVs.  Will maintain current IVs until PICC team can place PICC line.

## 2012-03-05 NOTE — Progress Notes (Signed)
Spoke with Dr. Valetta Fuller, orders received for PICC line, IV team paged and advised.

## 2012-03-05 NOTE — Progress Notes (Signed)
Name: [name] [name] MRN: [id number] DOB: 07/03/1929    LOS: 4  PULMONARY / CRITICAL CARE MEDICINE  Brief patient description:  78 yo admitted 7/20 with pneumonia and acute respiratory failure requiring intubation.  In the ED developed subcutaneous emphysema likely secondary to tracheal trauma during intubation.  Events since admission: 7/20  Admitted for pneumonia / acute respiratory failure, intubate 7/21  Chest CT demonstrated subcutaneous emphysema and pneumomediastinum.  Sepsis protocol completed.  Interval history:  No acute events overnight.  PICC placed.  Tolerating SBT well this AM.  Vital Signs: Temp:  [98.1 F (36.7 C)-98.6 F (37 C)] 98.3 F (36.8 C) (07/24 0812) Pulse Rate:  [60-82] 71  (07/24 1100) Resp:  [17-23] 19  (07/24 1100) BP: (106-186)/(47-82) 147/58 mmHg (07/24 1100) SpO2:  [91 %-100 %] 95 % (07/24 1100) FiO2 (%):  [39.7 %-40.5 %] 40.1 % (07/24 1100) Weight:  [66.3 kg (146 lb 2.6 oz)] 66.3 kg (146 lb 2.6 oz) (07/24 0500)  Physical Examination: General:  Intubated, mechanically ventilated, no acute distress Neuro:  Follows commands, cough / gag present HEENT:  PERRL, NGT, OETT Neck:  No JVD   Cardiovascular:  RRR, no M/R/G Lungs:  Scattered rhonchi, bilateral air entry Abdomen:  Soft, nontender, nondistended, bowel sounds present Musculoskeletal:  Moves all extremities, no pedal edema Skin:  No rash  ASSESSMENT AND PLAN  PULMONARY  Lab 03/03/12 1044 03/02/12 0343 03/01/12 2057  PHART 7.325* 7.401 7.377  PCO2ART 47.5* 42.8 51.8*  PO2ART 93.0 72.0* 198.0*  HCO3 24.8* 26.6* 29.7*  O2SAT 97.0 94.0 100.0   Ventilator Settings: Vent Mode:  [-] PSV FiO2 (%):  [39.7 %-40.5 %] 40.1 % Set Rate:  [18 bmp] 18 bmp Vt Set:  [450 mL] 450 mL PEEP:  [5 cmH20] 5 cmH20 Pressure Support:  [5 cmH20-10 cmH20] 5 cmH20 Plateau Pressure:  [14 cmH20-22 cmH20] 14 cmH20  CXR:  None today ETT:  7/20 >>>  A:  Aspiration pneumonia / pneumonitis.  Acute  respiratory failure.  Pneumomediastinum / pneumopericardium, likely secondary to traumatic intubation. P:   SBT with intent to extubate today Bronchodilators Mucolytics   CARDIOVASCULAR  Lab 03/02/12 2141 03/02/12 1341 03/02/12 0550 03/02/12 0345 03/01/12 2027  TROPONINI 0.53* 0.60* 0.81* -- --  LATICACIDVEN -- -- -- 2.3* 4.5*  PROBNP -- -- -- -- --   Lines: PICC L 7/24 >>>  A: Sepsis, resolved.  Demand ischemia.  Dyslipidemia. P:  ASA D/c Metoprolol IV Metoprolol 12.5 PO q12 Norvasc 5 Lipitor  RENAL  Lab 03/05/12 0455 03/04/12 0355 03/03/12 0330 03/02/12 0345 03/01/12 2022 03/01/12 1959  NA 149* 151* 144 146* 143 --  K 3.3* 3.6 -- -- -- --  CL 115* 117* 110 105 104 --  CO2 23 23 22 24  -- 29  BUN 39* 42* 51* 47* 42* --  CREATININE 0.92 1.04 1.55* 2.33* 2.10* --  CALCIUM 8.0* 7.9* 7.7* 8.0* -- 9.0  MG -- -- -- 2.0 -- --  PHOS -- -- -- 3.4 -- --   Intake/Output      07/23 0701 - 07/24 0700 07/24 0701 - 07/25 0700   I.V. (mL/kg) 1740.8 (26.3) 200 (3)   NG/GT 265 140   IV Piggyback 360    Total Intake(mL/kg) 2365.8 (35.7) 340 (5.1)   Urine (mL/kg/hr) 975 (0.6) 210 (0.7)   Total Output 975 210   Net +1390.8 +130         Foley:  7/20  A:  Normal renal function.  Hypernatremia, inproving.  Hypokalemia. P:   IVF 1/2 NS@ 50 mL/h KCL 40 Trend CBC  GASTROINTESTINAL  Lab 03/04/12 0355 03/01/12 1959  AST 774* 5010*  ALT 1649* 3160*  ALKPHOS 164* 95  BILITOT 0.5 0.6  PROT 5.4* 6.7  ALBUMIN 2.1* 3.0*   A:  Shocked liver, improving.  Low likelihood of esophageal perforation.  P:   Trend LFT TF on hold for possible extubation  HEMATOLOGIC  Lab 03/05/12 0455 03/04/12 0355 03/03/12 0330 03/02/12 0345 03/01/12 2022 03/01/12 1959  HGB 11.3* 12.1 12.4 12.8 15.0 --  HCT 34.3* 36.8 37.7 38.4 44.0 --  PLT 109* 120* 101* 133* -- 161  INR -- -- -- -- -- 1.40  APTT -- -- -- -- -- 33   A:  Thrombocytopenia. P:  Trend CBC  INFECTIOUS  Lab 03/05/12 0455 03/04/12  0355 03/03/12 0330 03/02/12 0345 03/01/12 1959  WBC 16.7* 22.1* 20.7* 17.4* 10.7*  PROCALCITON -- -- 32.25 -- 17.31   Cultures: 7/20  Blood  >>> Haemophilus (beta lactamase positive) 7/21  Blood >>> Haemophilus (beta lactamase positive) 7/21  Urine >>> Diphtheroids  Antibiotics: Zosyn  7/20 >>> 7/24 Vancomycin 7/20 >>> 7/24 Ceftriaxone 7/24 >>>  A:  Aspiration pneumonia / CAP P:   Antibiotics / cultures as above (discused with Clinical Pharmacist)  ENDOCRINE  Lab 03/05/12 0810 03/05/12 0332 03/04/12 2358 03/04/12 2011 03/04/12 1534  GLUCAP 157* 158* 172* 137* 111*   A:  Hyperglycemia.  Hypothyroidism. P:   CBG/SSI Synthroid  NEUROLOGIC  A:  Acute encephalopathy secondary to sedation. P:   Hold sedation as expecting to extubate  BEST PRACTICE / DISPOSITION Level of Care:  ICU Primary Service:  PCCM Consultants:  None Code Status:  DNR (ok to intubate) Diet:  TF DVT Px:  Heparin GI Px:  Protonix Skin Integrity:  Intact Social / Family:  Not available for rounds  The patient is critically ill with multiple organ systems failure and requires high complexity decision making for assessment and support, frequent evaluation and titration of therapies, application of advanced monitoring technologies and extensive interpretation of multiple databases. Critical Care Time devoted to patient care services described in this note is 35 minutes.  Orlean Bradford, M.D., F.C.C.P. Pulmonary and Critical Care Medicine [facility] Cell: [phone] Pager: [phone]  03/05/2012, 11:14 AM

## 2012-03-06 ENCOUNTER — Inpatient Hospital Stay (HOSPITAL_COMMUNITY): Payer: Medicare Other

## 2012-03-06 LAB — COMPREHENSIVE METABOLIC PANEL
AST: 97 U/L — ABNORMAL HIGH (ref 0–37)
Albumin: 2.1 g/dL — ABNORMAL LOW (ref 3.5–5.2)
BUN: 28 mg/dL — ABNORMAL HIGH (ref 6–23)
Calcium: 8.2 mg/dL — ABNORMAL LOW (ref 8.4–10.5)
Chloride: 116 mEq/L — ABNORMAL HIGH (ref 96–112)
Creatinine, Ser: 0.77 mg/dL (ref 0.50–1.10)
Total Bilirubin: 0.3 mg/dL (ref 0.3–1.2)
Total Protein: 5.1 g/dL — ABNORMAL LOW (ref 6.0–8.3)

## 2012-03-06 LAB — CBC
HCT: 34.6 % — ABNORMAL LOW (ref 36.0–46.0)
MCH: 30.6 pg (ref 26.0–34.0)
MCHC: 32.9 g/dL (ref 30.0–36.0)
MCV: 92.8 fL (ref 78.0–100.0)
Platelets: 110 10*3/uL — ABNORMAL LOW (ref 150–400)
RDW: 16.9 % — ABNORMAL HIGH (ref 11.5–15.5)
WBC: 15.5 10*3/uL — ABNORMAL HIGH (ref 4.0–10.5)

## 2012-03-06 LAB — GLUCOSE, CAPILLARY
Glucose-Capillary: 107 mg/dL — ABNORMAL HIGH (ref 70–99)
Glucose-Capillary: 126 mg/dL — ABNORMAL HIGH (ref 70–99)
Glucose-Capillary: 143 mg/dL — ABNORMAL HIGH (ref 70–99)
Glucose-Capillary: 150 mg/dL — ABNORMAL HIGH (ref 70–99)
Glucose-Capillary: 175 mg/dL — ABNORMAL HIGH (ref 70–99)

## 2012-03-06 MED ORDER — ACETYLCYSTEINE 20 % IN SOLN
2.0000 mL | Freq: Four times a day (QID) | RESPIRATORY_TRACT | Status: AC
  Administered 2012-03-06 – 2012-03-08 (×7): 2 mL via RESPIRATORY_TRACT
  Filled 2012-03-06 (×10): qty 4

## 2012-03-06 MED ORDER — ACETAMINOPHEN 160 MG/5ML PO SOLN
650.0000 mg | Freq: Three times a day (TID) | ORAL | Status: DC | PRN
  Administered 2012-03-06 – 2012-03-10 (×3): 650 mg
  Filled 2012-03-06 (×4): qty 20.3

## 2012-03-06 MED ORDER — DEXTROSE 5 % IV SOLN
INTRAVENOUS | Status: DC
  Administered 2012-03-06: 10:00:00 via INTRAVENOUS

## 2012-03-06 MED ORDER — INSULIN GLARGINE 100 UNIT/ML ~~LOC~~ SOLN
10.0000 [IU] | Freq: Every day | SUBCUTANEOUS | Status: DC
  Administered 2012-03-06 – 2012-03-08 (×3): 10 [IU] via SUBCUTANEOUS

## 2012-03-06 MED ORDER — METOPROLOL TARTRATE 50 MG PO TABS
50.0000 mg | ORAL_TABLET | Freq: Two times a day (BID) | ORAL | Status: DC
  Administered 2012-03-06 – 2012-03-13 (×15): 50 mg via ORAL
  Filled 2012-03-06 (×18): qty 1

## 2012-03-06 MED ORDER — ACETYLCYSTEINE 10% NICU INHALATION SOLUTION: 2.0000 mL | Freq: Four times a day (QID) | RESPIRATORY_TRACT | Status: DC

## 2012-03-06 NOTE — Progress Notes (Signed)
Nutrition Follow-up  Intervention:    Continue current EN regimen RD to follow for nutrition care plan  Assessment:   Patient extubated 7/24 PM. EN regimen: Jevity 1.2 formula at 35 ml/hr with Prostat liquid protein 30 ml twice daily providing 1208 total kcals, 77 gm protein, 678 ml of free water. Liquid MVI daily to help meet RDI's. Bedside swallow evaluation pending.  Diet Order:  NPO  Meds: Scheduled Meds:   . acetylcysteine  2 mL Nebulization Q6H  . acetylcysteine  4 mL Nebulization BID  . ipratropium  0.5 mg Nebulization Q6H   And  . albuterol  2.5 mg Nebulization Q6H  . amLODipine  5 mg Oral Daily  . antiseptic oral rinse  15 mL Mouth Rinse QID  . aspirin  300 mg Rectal Daily  . atorvastatin  10 mg Oral QHS  . cefTRIAXone (ROCEPHIN)  IV  2 g Intravenous Q24H  . chlorhexidine  15 mL Mouth Rinse BID  . feeding supplement  30 mL Per Tube BID  . heparin  5,000 Units Subcutaneous Q8H  . insulin aspart  0-15 Units Subcutaneous Q4H  . insulin glargine  10 Units Subcutaneous QHS  . levothyroxine  175 mcg Oral Daily  . metoprolol tartrate  50 mg Oral BID  . multivitamin  5 mL Per Tube Daily  . sodium chloride  10-40 mL Intracatheter Q12H  . DISCONTD: acetylcysteine  2 mL Nebulization Q6H  . DISCONTD: metoprolol tartrate  12.5 mg Oral BID  . DISCONTD: pantoprazole sodium  40 mg Per Tube QHS   Continuous Infusions:   . dextrose    . feeding supplement (JEVITY 1.2 CAL) 1,000 mL (03/05/12 1500)  . DISCONTD: sodium chloride 50 mL/hr at 03/06/12 0700  . DISCONTD: feeding supplement (JEVITY 1.2 CAL) 20 mL/hr at 03/04/12 1531   PRN Meds:.sodium chloride, fentaNYL, sodium chloride, DISCONTD: albuterol  Labs:  CMP     Component Value Date/Time   NA 152* 03/06/2012 0500   K 3.6 03/06/2012 0500   CL 116* 03/06/2012 0500   CO2 30 03/06/2012 0500   GLUCOSE 134* 03/06/2012 0500   BUN 28* 03/06/2012 0500   CREATININE 0.77 03/06/2012 0500   CALCIUM 8.2* 03/06/2012 0500   PROT 5.1*  03/06/2012 0500   ALBUMIN 2.1* 03/06/2012 0500   AST 97* 03/06/2012 0500   ALT 677* 03/06/2012 0500   ALKPHOS 115 03/06/2012 0500   BILITOT 0.3 03/06/2012 0500   GFRNONAA 77* 03/06/2012 0500   GFRAA 89* 03/06/2012 0500     Intake/Output Summary (Last 24 hours) at 03/06/12 1220 Last data filed at 03/06/12 0700  Gross per 24 hour  Intake   1765 ml  Output   1160 ml  Net    605 ml    CBG (last 3)   Basename 03/06/12 1141 03/06/12 0816 03/06/12 0406  GLUCAP 132* 150* 107*    Weight Status:  63.1 kg (7/25) -- stable  Re-estimated needs:  1150-1250 kcals, 75-85 gm protein  Nutrition Dx:  Inadequate Oral Intake r/t inability to eat as evidenced by NPO status, ongoing  Goal:  EN to meet >/= 90% of estimated nutrition needs, met  Monitor:  EN regimen & tolerance, PO diet advancement, weight, labs, I/O's  Kirkland Hun, RD, LDN Pager #: [phone] After-Hours Pager #: [phone]

## 2012-03-06 NOTE — Evaluation (Signed)
Clinical/Bedside Swallow Evaluation Patient Details  Name: [name] [name] MRN: [id number] Date of Birth: Feb 01, 1929  Today's Date: 03/06/2012 Time: 4098-1191 SLP Time Calculation (min): 17 min  Past Medical History: No past medical history on file. Past Surgical History: No past surgical history on file. HPI:  78 yo admitted 7/20 with pneumonia and acute respiratory failure requiring intubation.  In the ED developed subcutaneous emphysema likely secondary to tracheal trauma during intubation.   Assessment / Plan / Recommendation Clinical Impression  Pt presentw tih overt signs of aspiration which, combined with multiple risk factors (hoarse voice, prolongued intubation, medical decompensation, RR near 30) put pt at high risk of aspriation pna. Feel pt would benefit from another day of recovery with alternate nutrition. SLp will f/u in am to reassess with POs and determine if pt needs objective testing. Recommend pt remain NPO with panda.     Aspiration Risk  Severe    Diet Recommendation Alternative means - temporary;NPO   Medication Administration: Via alternative means    Other  Recommendations Recommended Consults: FEES Oral Care Recommendations: Oral care QID   Follow Up Recommendations  Inpatient Rehab    Frequency and Duration min 2x/week  2 weeks   Pertinent Vitals/Pain NA    SLP Swallow Goals Goal #3: Pt will consume PO trials with SLP adequate respiratory rate with or without signs of aspiration to determine readiness for objective assessment.    Swallow Study Prior Functional Status       General HPI: 78 yo admitted 7/20 with pneumonia and acute respiratory failure requiring intubation.  In the ED developed subcutaneous emphysema likely secondary to tracheal trauma during intubation. Type of Study: Bedside swallow evaluation Diet Prior to this Study: NPO;Panda Temperature Spikes Noted: No Respiratory Status: Supplemental O2 delivered via  (comment) History of Recent Intubation: Yes Length of Intubations (days): 5 days Date extubated: 03/05/12 Behavior/Cognition: Alert;Cooperative;Pleasant mood Self-Feeding Abilities: Able to feed self Patient Positioning: Upright in bed Baseline Vocal Quality: Hoarse Volitional Cough: Congested Volitional Swallow: Able to elicit    Oral/Motor/Sensory Function Overall Oral Motor/Sensory Function: Appears within functional limits for tasks assessed   Ice Chips Ice chips: Impaired Presentation: Spoon Pharyngeal Phase Impairments: Cough - Immediate   Thin Liquid Thin Liquid: Impaired Presentation: Cup;Self Fed Pharyngeal  Phase Impairments: Multiple swallows;Cough - Immediate;Throat Clearing - Immediate    Nectar Thick Nectar Thick Liquid: Not tested   Honey Thick Honey Thick Liquid: Not tested   Puree Puree: Impaired Presentation: Spoon Pharyngeal Phase Impairments: Multiple swallows;Throat Clearing - Immediate   Solid Solid: Not tested    [name] [name] 03/06/2012,3:25 PM

## 2012-03-06 NOTE — Progress Notes (Signed)
Xray shows PICC tip in azygos vein/  Retracted 1cm and will xray to determine PICC tip placement/ Flushed easily with good blood return

## 2012-03-06 NOTE — Progress Notes (Signed)
Name: [name] [name] MRN: [id number] DOB: [birth year]    LOS: 5  PULMONARY / CRITICAL CARE MEDICINE  Brief patient description:  78 yo admitted 7/20 with pneumonia and acute respiratory failure requiring intubation.  In the ED developed subcutaneous emphysema likely secondary to tracheal trauma during intubation.  Events since admission: 7/20  Admitted for pneumonia / acute respiratory failure, intubate 7/21  Chest CT demonstrated subcutaneous emphysema and pneumomediastinum.  Sepsis protocol completed. 7/24  Extubated  Interval history:  Extubated 7/24.  No distress overnight.  Vital Signs: Temp:  [97.9 F (36.6 C)-99.5 F (37.5 C)] 97.9 F (36.6 C) (07/25 0817) Pulse Rate:  [71-96] 91  (07/25 0700) Resp:  [18-27] 27  (07/25 0700) BP: (134-176)/(58-103) 176/73 mmHg (07/25 0700) SpO2:  [92 %-99 %] 95 % (07/25 0700) FiO2 (%):  [39.9 %-40.4 %] 40.3 % (07/24 1200) Weight:  [63.1 kg (139 lb 1.8 oz)] 63.1 kg (139 lb 1.8 oz) (07/25 0600)  Physical Examination: General:  Comfortable, no distress Neuro:  Awake, alert, cooperative HEENT:  PERRL Neck:  No JVD   Cardiovascular:  RRR, no M/R/G Lungs:  Bilateral air entry, rhonchi clearing up with cough Abdomen:  Soft, nontender, nondistended, bowel sounds present Musculoskeletal:  Moves all extremities Skin:  No rash  ASSESSMENT AND PLAN  PULMONARY  Lab 03/03/12 1044 03/02/12 0343 03/01/12 2057  PHART 7.325* 7.401 7.377  PCO2ART 47.5* 42.8 51.8*  PO2ART 93.0 72.0* 198.0*  HCO3 24.8* 26.6* 29.7*  O2SAT 97.0 94.0 100.0   Ventilator Settings: Vent Mode:  [-] PSV FiO2 (%):  [39.9 %-40.4 %] 40.3 % PEEP:  [5 cmH20] 5 cmH20 Pressure Support:  [5 cmH20] 5 cmH20  CXR:  7/25 >>> Interstitial / alveolar infiltrates L>R, PICC tip possibly in azygos vein ETT:  7/20 >>>7/24  A:  Aspiration pneumonia with H.flu.  Acute respiratory failure.  Pneumomediastinum / pneumopericardium, likely secondary to traumatic intubation. P:     Bronchodilators Renew mucomyst  CARDIOVASCULAR  Lab 03/02/12 2141 03/02/12 1341 03/02/12 0550 03/02/12 0345 03/01/12 2027  TROPONINI 0.53* 0.60* 0.81* -- --  LATICACIDVEN -- -- -- 2.3* 4.5*  PROBNP -- -- -- -- --   Lines: PICC L 7/24 >>>  A: Sepsis, resolved.  Demand ischemia.  Dyslipidemia.  Hypertension. P:  ASA Increase Metoprolol 50 PO q12h (home dose) Norvasc 5 Lipitor IV team to readjust PICC line  RENAL  Lab 03/06/12 0500 03/05/12 0455 03/04/12 0355 03/03/12 0330 03/02/12 0345  NA 152* 149* 151* 144 146*  K 3.6 3.3* -- -- --  CL 116* 115* 117* 110 105  CO2 30 23 23 22 24   BUN 28* 39* 42* 51* 47*  CREATININE 0.77 0.92 1.04 1.55* 2.33*  CALCIUM 8.2* 8.0* 7.9* 7.7* 8.0*  MG -- -- -- -- 2.0  PHOS -- -- -- -- 3.4   Intake/Output      07/24 0701 - 07/25 0700 07/25 0701 - 07/26 0700   I.V. (mL/kg) 1200 (19)    NG/GT 905    IV Piggyback 50    Total Intake(mL/kg) 2155 (34.2)    Urine (mL/kg/hr) 1470 (1)    Total Output 1470    Net +685         Stool Occurrence 1 x     Foley:  7/20  A:  Normal renal function.  Hypernatremia, inproving.  Hypokalemia. P:   Change IVF to D5W @ 100 mL/h Trend CBC  GASTROINTESTINAL  Lab 03/06/12 0500 03/04/12 0355 03/01/12 1959  AST 97* 774*  5010*  ALT 677* 1649* 3160*  ALKPHOS 115 164* 95  BILITOT 0.3 0.5 0.6  PROT 5.1* 5.4* 6.7  ALBUMIN 2.1* 2.1* 3.0*   A:  Shocked liver, improving.  Low likelihood of esophageal perforation.  P:   Continue TF SLP evaluation  HEMATOLOGIC  Lab 03/06/12 0500 03/05/12 0455 03/04/12 0355 03/03/12 0330 03/02/12 0345 03/01/12 1959  HGB 11.4* 11.3* 12.1 12.4 12.8 --  HCT 34.6* 34.3* 36.8 37.7 38.4 --  PLT 110* 109* 120* 101* 133* --  INR -- -- -- -- -- 1.40  APTT -- -- -- -- -- 33   A:  Thrombocytopenia, stable P:  Trend CBC  INFECTIOUS  Lab 03/06/12 0500 03/05/12 0455 03/04/12 0355 03/03/12 0330 03/02/12 0345 03/01/12 1959  WBC 15.5* 16.7* 22.1* 20.7* 17.4* --  PROCALCITON --  -- -- 32.25 -- 17.31   Cultures: 7/20  Blood  >>> Haemophilus (beta lactamase positive) 7/21  Blood >>> Haemophilus (beta lactamase positive) 7/21  Urine >>> Diphtheroids  Antibiotics: Zosyn  7/20 >>> 7/24 Vancomycin 7/20 >>> 7/24 Ceftriaxone 7/24 >>>  A:  Aspiration pneumonia / CAP P:   Antibiotics / cultures as above (discused with Clinical Pharmacist)  ENDOCRINE  Lab 03/06/12 0816 03/06/12 0406 03/06/12 0002 03/05/12 2024 03/05/12 1614  GLUCAP 150* 107* 126* 135* 100*   A:  Hyperglycemia.  Hypothyroidism. P:   CBG/SSI Start Lantus 10 Synthroid  NEUROLOGIC  A:  Acute encephalopathy secondary to sedation. P:   Monitor PT/OT  BEST PRACTICE / DISPOSITION Level of Care:  ICU Primary Service:  PCCM Consultants:  None Code Status:  DNR (ok to intubate) Diet:  TF, awaiting SLP evaluation DVT Px:  Heparin GI Px:  Not indicated Skin Integrity:  Intact Social / Family:  Not available for rounds  Orlean Bradford, M.D., F.C.C.P. Pulmonary and Critical Care Medicine [facility] Cell: [phone] Pager: [phone]  03/06/2012, 8:51 AM

## 2012-03-07 ENCOUNTER — Inpatient Hospital Stay (HOSPITAL_COMMUNITY): Payer: Medicare Other

## 2012-03-07 LAB — BASIC METABOLIC PANEL
CO2: 32 mEq/L (ref 19–32)
Chloride: 102 mEq/L (ref 96–112)
Creatinine, Ser: 0.65 mg/dL (ref 0.50–1.10)
Glucose, Bld: 150 mg/dL — ABNORMAL HIGH (ref 70–99)

## 2012-03-07 LAB — GLUCOSE, CAPILLARY
Glucose-Capillary: 147 mg/dL — ABNORMAL HIGH (ref 70–99)
Glucose-Capillary: 133 mg/dL — ABNORMAL HIGH (ref 70–99)
Glucose-Capillary: 95 mg/dL (ref 70–99)
Glucose-Capillary: 145 mg/dL — ABNORMAL HIGH (ref 70–99)

## 2012-03-07 LAB — CBC
Hemoglobin: 12.5 g/dL (ref 12.0–15.0)
MCV: 93.4 fL (ref 78.0–100.0)
Platelets: 113 10*3/uL — ABNORMAL LOW (ref 150–400)
RBC: 4.1 MIL/uL (ref 3.87–5.11)
WBC: 12.4 10*3/uL — ABNORMAL HIGH (ref 4.0–10.5)

## 2012-03-07 MED ORDER — FREE WATER
200.0000 mL | Freq: Four times a day (QID) | Status: DC
  Administered 2012-03-07 – 2012-03-08 (×3): 200 mL

## 2012-03-07 MED ORDER — ASPIRIN 325 MG PO TABS
325.0000 mg | ORAL_TABLET | Freq: Every day | ORAL | Status: DC
  Administered 2012-03-07 – 2012-03-13 (×7): 325 mg via ORAL
  Filled 2012-03-07 (×7): qty 1

## 2012-03-07 MED ORDER — AMLODIPINE BESYLATE 10 MG PO TABS
10.0000 mg | ORAL_TABLET | Freq: Every day | ORAL | Status: DC
  Administered 2012-03-07 – 2012-03-13 (×7): 10 mg via ORAL
  Filled 2012-03-07 (×7): qty 1

## 2012-03-07 NOTE — Evaluation (Signed)
Occupational Therapy Evaluation Patient Details Name: [name] [name] MRN: [id number] DOB: [birth year] Today's Date: 03/07/2012 Time: 3244-0102 OT Time Calculation (min): 18 min  OT Assessment / Plan / Recommendation Clinical Impression  [age] yr old female admitted to the hospital secondary to respiratory failure.  Now presents with signifincant impairments in selfcare independence.  Feel she will benefit from acute OT needs to address these deficits and increase overall independence.  Feel she will need SNF follow-up secondary to not having 24 hour supervision at discharge.    OT Assessment  Patient needs continued OT Services    Follow Up Recommendations  Skilled nursing facility    Barriers to Discharge Decreased caregiver support    Equipment Recommendations  Defer to next venue       Frequency  Min 2X/week    Precautions / Restrictions Precautions Precautions: Fall Restrictions Weight Bearing Restrictions: No   Pertinent Vitals/Pain O2 sats greater than 92 % on 2-3 ls nasal cannula    ADL  Eating/Feeding: Simulated;Minimal assistance Where Assessed - Eating/Feeding: Edge of bed Grooming: Performed;Moderate assistance Where Assessed - Grooming: Unsupported sitting Upper Body Bathing: Simulated;Minimal assistance Where Assessed - Upper Body Bathing: Unsupported sitting Lower Body Bathing: Simulated;+2 Total assistance Lower Body Bathing: Patient Percentage: 40% Where Assessed - Lower Body Bathing: Supported sit to stand Upper Body Dressing: Simulated;Set up Where Assessed - Upper Body Dressing: Unsupported sitting Lower Body Dressing: Simulated;+2 Total assistance Lower Body Dressing: Patient Percentage: 40% Where Assessed - Lower Body Dressing: Supported sit to stand Toilet Transfer: Simulated;+2 Total assistance Toilet Transfer: Patient Percentage: 30% Statistician Method: Surveyor, minerals: Other (comment) (Up EOB pt declined  need to toilet.) Toileting - Clothing Manipulation and Hygiene: Simulated;+2 Total assistance Toileting - Clothing Manipulation and Hygiene: Patient Percentage: 40% Where Assessed - Toileting Clothing Manipulation and Hygiene: Sit to stand from 3-in-1 or toilet Tub/Shower Transfer Method: Not assessed Transfers/Ambulation Related to ADLs: Pt required total +2 (pt 40%) for taking a few steps up toward the edge of the bed. ADL Comments: Pt with delayed answering to questions at times but fully oriented to place and situation, but not time.  Will need SNF for follow-up rehab secondary to not having 24 hour supervision.    OT Diagnosis: Generalized weakness;Altered mental status  OT Problem List: Decreased strength;Decreased activity tolerance;Impaired balance (sitting and/or standing);Decreased safety awareness;Decreased knowledge of use of DME or AE;Cardiopulmonary status limiting activity OT Treatment Interventions: Self-care/ADL training;Therapeutic exercise;Energy conservation;Therapeutic activities;DME and/or AE instruction;Patient/family education;Balance training   OT Goals Acute Rehab OT Goals OT Goal Formulation: With patient Time For Goal Achievement: 03/21/12 Potential to Achieve Goals: Good ADL Goals Pt Will Perform Eating: with set-up;Supported;Sitting, chair ADL Goal: Eating - Progress: Goal set today Pt Will Perform Grooming: with set-up;Unsupported;Sitting, edge of bed ADL Goal: Grooming - Progress: Goal set today Pt Will Perform Upper Body Bathing: with set-up;Unsupported;Sitting, edge of bed ADL Goal: Upper Body Bathing - Progress: Goal set today Pt Will Perform Lower Body Bathing: with mod assist;Sit to stand from bed ADL Goal: Lower Body Bathing - Progress: Goal set today Pt Will Transfer to Toilet: with mod assist;Ambulation;3-in-1;with DME ADL Goal: Toilet Transfer - Progress: Goal set today Miscellaneous OT Goals Miscellaneous OT Goal #1: Pt will increase BUE shoulder  flexion to 3/5 for greater independence with basic selfcare tasks.  Visit Information  Last OT Received On: 03/07/12 Assistance Needed: +2 PT/OT Co-Evaluation/Treatment: Yes    Subjective Data  Subjective: "I came down to be closer to  my grandsons." Patient Stated Goal: Did not state but agreeable to participate in therapy.   Prior Functioning  Vision/Perception  Home Living Lives With: Alone Available Help at Discharge: Skilled Nursing Facility Bathroom Shower/Tub: Tub/shower unit Bathroom Toilet: Standard Home Adaptive Equipment: Walker - rolling Prior Function Level of Independence: Independent with assistive device(s) Driving: No Vocation: Retired Comments: amb with rolling walker. Communication Communication: HOH Dominant Hand: Right   Vision - Assessment Vision Assessment: Vision not tested Perception Perception: Within Functional Limits Praxis Praxis: Intact  Cognition  Overall Cognitive Status: Impaired Area of Impairment: Following commands Arousal/Alertness: Awake/alert Orientation Level: Time;Disoriented to Behavior During Session: Flat affect Following Commands: Follows one step commands inconsistently Cognition - Other Comments: Pt slow to respond at times.  Engaged with conversation at times but then stops responding to questions or commands.    Extremity/Trunk Assessment Right Upper Extremity Assessment RUE ROM/Strength/Tone: Deficits RUE ROM/Strength/Tone Deficits: Shoulder flexion 2/5, elbow flexion 3-/5, gross finger flexion and extension 3-/5 RUE Coordination: Deficits RUE Coordination Deficits: Only able to exhibit groo grasp and release. Left Upper Extremity Assessment LUE ROM/Strength/Tone: Deficits LUE ROM/Strength/Tone Deficits: Shoulder flexion 2/5, elbow flexion 3-/5, gross finger flexion and extension 3-/5 LUE Sensation: WFL - Light Touch LUE Coordination: Deficits LUE Coordination Deficits: Only able to exhibit groo grasp and  release. Right Lower Extremity Assessment RLE ROM/Strength/Tone: Deficits RLE ROM/Strength/Tone Deficits: functionally 3+/5 Left Lower Extremity Assessment LLE ROM/Strength/Tone: Deficits LLE ROM/Strength/Tone Deficits: functionally 3+/5 Trunk Assessment Trunk Assessment: Other exceptions Trunk Exceptions: Posterior lean in sitting.   Mobility Bed Mobility Bed Mobility: Supine to Sit Supine to Sit: 1: +2 Total assist;HOB elevated Supine to Sit: Patient Percentage: 10% Sitting - Scoot to Edge of Bed: 1: +2 Total assist Sitting - Scoot to Edge of Bed: Patient Percentage: 10% Sit to Supine: 1: +2 Total assist Sit to Supine: Patient Percentage: 10% Scooting to HOB: 1: +2 Total assist Scooting to [facility]: Patient Percentage: 0% Details for Bed Mobility Assistance: Pt needed verbal/tactile cues for technique. Transfers Transfers: Sit to Stand Sit to Stand: 1: +2 Total assist;From bed;With upper extremity assist Sit to Stand: Patient Percentage: 40% Stand to Sit: 1: +2 Total assist;Without upper extremity assist;To bed Stand to Sit: Patient Percentage: 40%      Balance Static Sitting Balance Static Sitting - Balance Support: Right upper extremity supported;Left upper extremity supported Static Sitting - Level of Assistance: 3: Mod assist Static Sitting - Comment/# of Minutes: Sat x 10 minutes with assist to correct posterior lean.    End of Session OT - End of Session Activity Tolerance: Patient limited by fatigue Patient left: in bed;with call bell/phone within reach     [facility] OTR/L Pager number [phone] 03/07/2012, 3:44 PM

## 2012-03-07 NOTE — Progress Notes (Signed)
Pt pulls 250 on IS

## 2012-03-07 NOTE — Significant Event (Signed)
1535pm-pt transferred to 3305 safely. VS stable prior and during the transfer, traveled via bed, on 3L [place], portable telemetry. Pt grandsons made aware of the transfer. Pt personal belongings are clothes which are brought to 3305. Report given to receiving RN, Lissa Hoard. Niara Bunker, Charity fundraiser.

## 2012-03-07 NOTE — Procedures (Signed)
Fiberoptic Endoscopic Evaluation of Swallowing  Patient Details  Name: [name] [name] MRN: [id number] Date of Birth: 1929/02/26  Today's Date: 03/07/2012 Time: 1310-1407 SLP Time Calculation (min): 57 min  Past Medical History: No past medical history on file. Past Surgical History: No past surgical history on file. HPI:  78 yo admitted 7/20 with pneumonia and acute respiratory failure requiring intubation.  In the ED developed subcutaneous emphysema likely secondary to tracheal trauma during intubation. Bedside swallow eval 7/25 revealed overt signs of aspiration which, when combined with multiple risk factors (hoarse voice, prolonged intubation, medical decompensation, RR near 30) put pt at high risk of aspiration.  Bedside f/u 7/26 with improvements sufficient to participate in FEES.     Assessment / Plan / Recommendation Clinical Impression  Dysphagia Diagnosis: Mild pharyngeal phase dysphagia  Clinical impression: Pt presents with adequate swallow function sufficient to begun conservative POs.  Larynx was mildly edematous, particularly true vocal folds and arytenoids.  There were standing secretions in pharyngeal sinuses (clear/thin) but not within the laryngeal space.  Pt consumed all trial POs with adequate timing and oral bolus control; there was no observed aspiration.  However, as study progressed and fatigue worsened, pt demonstrated decreased control of boluses with more frequent, high penetration of solid boluses  (lining anterior upper third of epiglottis.) No pharyngeal residue was present throughout exam.    Recommend initiating a Dysphagia 1 diet with thin liquids.   Staff to assist with meals secondary to deconditioning/inability to self-feed at this time.   Monitor closely for deterioration of swallow associated with fatigue.      Treatment Recommendation  Therapy as outlined in treatment plan below    Diet Recommendation Dysphagia 1;Thin liquid   Liquid  Administration via: Cup Medication Administration: Whole meds with puree Supervision: Staff feed patient Compensations: Slow rate;Small sips/bites Postural Changes and/or Swallow Maneuvers: Out of bed for meals;Seated upright 90 degrees    Other  Recommendations Oral Care Recommendations: Oral care BID Other Recommendations: Have oral suction available   Follow Up Recommendations  After discussion with OT/PT, SNF likely necessary     Frequency and Duration min 2x/week  2 weeks   Pertinent Vitals/Pain No pain    SLP Swallow Goals Patient will utilize recommended strategies during swallow to increase swallowing safety with: Minimal assistance Goal #3: Pt will consume PO trials with SLP adequate respiratory rate with or without signs of aspiration to determine readiness for objective assessment.  Swallow Study Goal #3 - Progress: Met   Sritha Chauncey L. Samson Frederic, [place] CCC/SLP Pager [phone]                 [name] 03/07/2012, 2:34 PM

## 2012-03-07 NOTE — Significant Event (Signed)
Discussed with MD Z regarding need for foley catheter this morning during rounds. Per MD, continue with foley catheter for urinary output monitoring. Amarylis Rovito, Charity fundraiser.

## 2012-03-07 NOTE — Evaluation (Signed)
Physical Therapy Evaluation Patient Details Name: [name] [name] MRN: [id number] DOB: [birth year] Today's Date: 03/07/2012 Time: 5784-6962 PT Time Calculation (min): 18 min  PT Assessment / Plan / Recommendation Clinical Impression  Pt adm with resp failure and on vent.  Pt very weak and will need ST-SNF before return home.  Needs skilled PT to maximize I and safety to improve quality of life and decr burden of care.    PT Assessment  Patient needs continued PT services    Follow Up Recommendations  Skilled nursing facility    Barriers to Discharge Decreased caregiver support      Equipment Recommendations  Defer to next venue    Recommendations for Other Services     Frequency Min 2X/week    Precautions / Restrictions Precautions Precautions: Fall   Pertinent Vitals/Pain VSS      Mobility  Bed Mobility Bed Mobility: Supine to Sit;Sitting - Scoot to [place] of Bed;Sit to Supine;Scooting to [facility] Supine to Sit: HOB elevated;1: +2 Total assist Supine to Sit: Patient Percentage: 10% Sitting - Scoot to Edge of Bed: 1: +2 Total assist Sitting - Scoot to Edge of Bed: Patient Percentage: 10% Sit to Supine: 1: +2 Total assist Sit to Supine: Patient Percentage: 10% Scooting to HOB: 1: +2 Total assist Scooting to [facility]: Patient Percentage: 0% Details for Bed Mobility Assistance: Pt needed verbal/tactile cues for technique. Transfers Transfers: Sit to Stand;Stand to Sit Sit to Stand: From bed;1: +2 Total assist Sit to Stand: Patient Percentage: 40% Stand to Sit: 1: +2 Total assist;To bed Stand to Sit: Patient Percentage: 40% Ambulation/Gait Ambulation/Gait Assistance: 1: +2 Total assist Ambulation/Gait: Patient Percentage: 40% Ambulation Distance (Feet): 3 Feet (steps side stepping) Assistive device:  (2 person forearm support.) Gait Pattern: Trunk flexed;Left flexed knee in stance;Right flexed knee in stance    Exercises     PT Diagnosis: Difficulty walking;Generalized  weakness  PT Problem List: Decreased strength;Decreased activity tolerance;Decreased balance;Decreased mobility;Decreased knowledge of use of DME;Decreased cognition PT Treatment Interventions: DME instruction;Gait training;Functional mobility training;Therapeutic activities;Therapeutic exercise;Balance training;Patient/family education   PT Goals Acute Rehab PT Goals PT Goal Formulation: With patient Time For Goal Achievement: 03/07/12 Potential to Achieve Goals: Fair Pt will go Supine/Side to Sit: with mod assist PT Goal: Supine/Side to Sit - Progress: Goal set today Pt will Sit at Edge of Bed: with supervision;3-5 min PT Goal: Sit at Edge Of Bed - Progress: Goal set today Pt will go Sit to Supine/Side: with mod assist PT Goal: Sit to Supine/Side - Progress: Goal set today Pt will go Sit to Stand: with mod assist PT Goal: Sit to Stand - Progress: Goal set today Pt will go Stand to Sit: with mod assist PT Goal: Stand to Sit - Progress: Goal set today Pt will Ambulate: 1 - 15 feet;with +2 total assist PT Goal: Ambulate - Progress: Goal set today  Visit Information  Last PT Received On: 03/07/12 Assistance Needed: +2 PT/OT Co-Evaluation/Treatment: Yes    Subjective Data  Subjective: Pt states that she moved here because of her grandsons. Patient Stated Goal: To unpack   Prior Functioning  Home Living Lives With: Alone Available Help at Discharge: Skilled Nursing Facility Home Adaptive Equipment: Walker - rolling Prior Function Level of Independence: Independent with assistive device(s) Driving: No Vocation: Retired Comments: amb with rolling walker. Communication Communication: HOH    Cognition  Overall Cognitive Status: Impaired Area of Impairment: Following commands Arousal/Alertness: Awake/alert Orientation Level: Disoriented to;Time Behavior During Session: Flat affect Following Commands:  Follows one step commands inconsistently Cognition - Other Comments: Pt  slow to respond at times.  Engaged with conversation at times but then stops responding to questions or commands.    Extremity/Trunk Assessment Right Lower Extremity Assessment RLE ROM/Strength/Tone: Deficits RLE ROM/Strength/Tone Deficits: functionally 3+/5 Left Lower Extremity Assessment LLE ROM/Strength/Tone: Deficits LLE ROM/Strength/Tone Deficits: functionally 3+/5   Balance Static Sitting Balance Static Sitting - Balance Support: Bilateral upper extremity supported Static Sitting - Level of Assistance: 3: Mod assist;4: Min assist Static Sitting - Comment/# of Minutes: Sat x 10 minutes with assist to correct posterior lean.    End of Session PT - End of Session Activity Tolerance: Patient limited by fatigue Patient left: in bed;with call bell/phone within reach Nurse Communication: Mobility status  GP     [facility] 03/07/2012, 3:03 PM  [facility] PT [phone]

## 2012-03-07 NOTE — Progress Notes (Signed)
Spoke with Dr. Delford Field regarding tube feed; was told to leave it off.  Dayshift nurse reported patient to have eaten 100% of her food at dinner.  Was also to not give the free water since Sodium level 141 at 09:30 today.  Will continue to monitor.  Vivi Martens RN

## 2012-03-07 NOTE — Progress Notes (Signed)
Pt admitted to room 3305 a/o to her self and place , VSS ,, CHG bath done ,oriented to room and her new surroundings. Assisted with feeding and pt done very well she ate 100% of her food but had to drink slowly her liquids but I did not notice any coughing, or difficultly   Swallowing.

## 2012-03-07 NOTE — Progress Notes (Signed)
Clinical Social Work Department BRIEF PSYCHOSOCIAL ASSESSMENT 03/07/2012  Patient:  [name] [name], [name] [name]     Account Number:  [id number]     Admit date:  03/01/2012  Clinical Social Worker:  Conley Simmonds  Date/Time:  03/07/2012 12:50 PM  Referred by:  Care Management  Date Referred:  03/06/2012 Referred for  SNF Placement   Other Referral:   Interview type:  Family Other interview type:    PSYCHOSOCIAL DATA Living Status:  ALONE Admitted from facility:   Level of care:   Primary support name:  Edwinna Areola ([phone] Primary support relationship to patient:  FAMILY Degree of support available:   Durwin Reges    CURRENT CONCERNS Current Concerns  Post-Acute Placement   Other Concerns:    SOCIAL WORK ASSESSMENT / PLAN CSW spoke with pt grandson-who is primary support-regarding disposition. CSW discussed variety of options as pt has not been appropriate for pt/ot evaluation.  CSW reviewed HH with 24 hour care and SNF-pt grandson relayed that he and his brother have recently moved pt to [facility] and they are primary caregivers. Grandon relayed that he and brother would not be able to provide 24 hour care and if that may be needed SNF/Rehab would be the best option.  CSW explained placement process and will initiate a SNF search in the event that pt will need placement/24 hour care at d/c.   Assessment/plan status:  Psychosocial Support/Ongoing Assessment of Needs Other assessment/ plan:   Information/referral to community resources:   [facility]NF-[place]    PATIENT'S/FAMILY'S RESPONSE TO PLAN OF CARE: Pt grandson very appreciative of proactive intervention-he relayed the family will be returning to [place] over weekend to gather the remainder of pt's belongings.  Both Grandsons will be available on Sunday and throughout the week for d/c planning-  CSW will follow-  Jodean Lima, [phone]       1

## 2012-03-07 NOTE — Plan of Care (Signed)
Problem: Phase II Progression Outcomes Goal: Progress activity as tolerated unless otherwise ordered Outcome: Progressing Very weak to stand, requiring 2 person assists

## 2012-03-07 NOTE — Progress Notes (Signed)
Name: [name] [name] MRN: [id number] DOB: 1929/03/28    LOS: 6  PULMONARY / CRITICAL CARE MEDICINE  Brief patient description:  78 yo admitted 7/20 with pneumonia and acute respiratory failure requiring intubation.  In the ED developed subcutaneous emphysema likely secondary to tracheal trauma during intubation.  Events since admission: 7/20  Admitted for pneumonia / acute respiratory failure, intubate 7/21  Chest CT demonstrated subcutaneous emphysema and pneumomediastinum.  Sepsis protocol completed. 7/24  Extubated  Interval history:  Doing well overnight.  This morning is sitting in the chair and stating she is feeling better.  Vital Signs: Temp:  [98.1 F (36.7 C)-99 F (37.2 C)] 98.7 F (37.1 C) (07/26 0803) Pulse Rate:  [58-104] 79  (07/26 0700) Resp:  [13-36] 24  (07/26 0700) BP: (83-185)/(50-91) 152/61 mmHg (07/26 0700) SpO2:  [93 %-100 %] 96 % (07/26 0900) Weight:  [66.2 kg (145 lb 15.1 oz)] 66.2 kg (145 lb 15.1 oz) (07/26 0500)  Physical Examination: General:  Comfortable Neuro:  Cooperative HEENT:  PERRL Neck:  No JVD   Cardiovascular:  RRR, no M/R/G Lungs:  Bilateral air entry, no added breath sounds Abdomen:  Soft, nontender, nondistended, bowel sounds present Musculoskeletal:  Moves all extremities Skin:  No rash  ASSESSMENT AND PLAN  PULMONARY  Lab 03/03/12 1044 03/02/12 0343 03/01/12 2057  PHART 7.325* 7.401 7.377  PCO2ART 47.5* 42.8 51.8*  PO2ART 93.0 72.0* 198.0*  HCO3 24.8* 26.6* 29.7*  O2SAT 97.0 94.0 100.0   Ventilator Settings:   CXR:  None today ETT:  7/20 >>>7/24  A:  Aspiration pneumonia with H.flu.  Acute respiratory failure.  Pneumomediastinum / pneumopericardium, likely secondary to traumatic intubation. P:   Bronchodilators Renew mucomyst Incentive spirometry Add flutter valve CXR in AM  CARDIOVASCULAR  Lab 03/02/12 2141 03/02/12 1341 03/02/12 0550 03/02/12 0345 03/01/12 2027  TROPONINI 0.53* 0.60* 0.81* -- --    LATICACIDVEN -- -- -- 2.3* 4.5*  PROBNP -- -- -- -- --   Lines: PICC L 7/24 >>>  A: Sepsis, resolved.  Demand ischemia.  Dyslipidemia.  Hypertension, better control. P:  ASA change to PO Increase Metoprolol 50 PO q12h (home dose) Increase Norvasc to 10 Lipitor  RENAL  Lab 03/06/12 0500 03/05/12 0455 03/04/12 0355 03/03/12 0330 03/02/12 0345  NA 152* 149* 151* 144 146*  K 3.6 3.3* -- -- --  CL 116* 115* 117* 110 105  CO2 30 23 23 22 24   BUN 28* 39* 42* 51* 47*  CREATININE 0.77 0.92 1.04 1.55* 2.33*  CALCIUM 8.2* 8.0* 7.9* 7.7* 8.0*  MG -- -- -- -- 2.0  PHOS -- -- -- -- 3.4   Intake/Output      07/25 0701 - 07/26 0700 07/26 0701 - 07/27 0700   I.V. (mL/kg) 2100 (31.7)    NG/GT 890    IV Piggyback 50    Total Intake(mL/kg) 3040 (45.9)    Urine (mL/kg/hr) 2880 (1.8)    Stool 3    Total Output 2883    Net +157          Foley:  7/20  A:  Normal renal function.  Hypernatremia, inproving.  Hypokalemia. P:   D/c D5W as hyperglycemic Add free water via Panda 200 mL q6h BMP today and in AM  GASTROINTESTINAL  Lab 03/06/12 0500 03/04/12 0355 03/01/12 1959  AST 97* 774* 5010*  ALT 677* 1649* 3160*  ALKPHOS 115 164* 95  BILITOT 0.3 0.5 0.6  PROT 5.1* 5.4* 6.7  ALBUMIN 2.1* 2.1*  3.0*   A:  Shocked liver, improving.  Low likelihood of esophageal perforation.  P:   Continue TF SLP is following, will reevaluate  HEMATOLOGIC  Lab 03/06/12 0500 03/05/12 0455 03/04/12 0355 03/03/12 0330 03/02/12 0345 03/01/12 1959  HGB 11.4* 11.3* 12.1 12.4 12.8 --  HCT 34.6* 34.3* 36.8 37.7 38.4 --  PLT 110* 109* 120* 101* 133* --  INR -- -- -- -- -- 1.40  APTT -- -- -- -- -- 33   A:  Thrombocytopenia, stable P:  CBC today and in AM  INFECTIOUS  Lab 03/06/12 0500 03/05/12 0455 03/04/12 0355 03/03/12 0330 03/02/12 0345 03/01/12 1959  WBC 15.5* 16.7* 22.1* 20.7* 17.4* --  PROCALCITON -- -- -- 32.25 -- 17.31   Cultures: 7/20  Blood  >>> Haemophilus (beta lactamase  positive) 7/21  Blood >>> Haemophilus (beta lactamase positive) 7/21  Urine >>> Diphtheroids  Antibiotics: Zosyn  7/20 >>> 7/24 Vancomycin 7/20 >>> 7/24 Ceftriaxone 7/24 >>>  A:  Aspiration pneumonia / CAP P:   Antibiotics / cultures as above (discused with Clinical Pharmacist)  ENDOCRINE  Lab 03/07/12 0802 03/07/12 0354 03/07/12 0001 03/06/12 1938 03/06/12 1530  GLUCAP 147* 111* 135* 143* 175*   A:  Hyperglycemia.  Hypothyroidism. P:   CBG/SSI Lantus 10 Synthroid  NEUROLOGIC  A:  Acute encephalopathy secondary to sedation. P:   Monitor PT/OT  BEST PRACTICE / DISPOSITION Level of Care:  Downgrade to SDU Primary Service:  PCCM Consultants:  None Code Status:  DNR (ok to intubate) Diet:  TF, awaiting SLP reevaluation DVT Px:  Heparin GI Px:  Not indicated Skin Integrity:  Intact Social / Family:  Not available for rounds  Orlean Bradford, M.D., F.C.C.P. Pulmonary and Critical Care Medicine [facility] Cell: [phone] Pager: [phone]  03/07/2012, 9:04 AM

## 2012-03-07 NOTE — Progress Notes (Signed)
Speech Language Pathology Dysphagia Treatment Patient Details Name: [name] [name] MRN: [id number] DOB: [birth year] Today's Date: 03/07/2012 Time: 0920-0950 SLP Time Calculation (min): 30 min  Assessment / Plan / Recommendation Clinical Impression  F/U after yesterday's clinical swallow eval.  Pt with persisting dysphagia with multiple risk factors (phonation with glottal-fry-like quality, low functional reserve, traumatic intubation, impaired secretion management). She continues to demonstrate clinical symptoms of impaired swallow function. However, she is more alert today; RR rate has decreased to the mid-20's (making ventilation/swallowing more compatible).  Rec proceeding with FEES with objective of initiating at least conservative POs through the weekend and preventing further disuse atrophy.      Diet Recommendation  Continue with Current Diet: NPO    SLP Plan FEES       Swallowing Goals  SLP Swallowing Goals Goal #3: Pt will consume PO trials with SLP adequate respiratory rate with or without signs of aspiration to determine readiness for objective assessment.  Swallow Study Goal #3 - Progress: Progressing toward goal  General Temperature Spikes Noted: No Respiratory Status: Supplemental O2 delivered via (comment) (nasal cannula) Behavior/Cognition: Alert;Cooperative;Requires cueing Oral Cavity - Dentition: Edentulous Patient Positioning: Upright in chair  Oral Cavity - Oral Hygiene Does patient have any of the following "at risk" factors?: Other - dysphagia Brush patient's teeth BID with toothbrush (using toothpaste with fluoride): Yes Patient is HIGH RISK - Oral Care Protocol followed (see row info): Yes   Dysphagia Treatment Treatment focused on: Skilled observation of diet tolerance Treatment Methods/Modalities: Skilled observation;Differential diagnosis Patient observed directly with PO's: Yes Type of PO's observed: Thin liquids;Ice chips Feeding: Needs  assist Liquids provided via: Teaspoon Pharyngeal Phase Signs & Symptoms: Suspected delayed swallow initiation;Multiple swallows;Wet vocal quality;Delayed cough Type of cueing: Verbal;Tactile Amount of cueing: Minimal   [name] [name] L. [name] [name], [place] CCC/SLP Pager [phone]      [name] [name] [name] [name] 03/07/2012, 10:02 AM

## 2012-03-08 LAB — CBC
HCT: 34.8 % — ABNORMAL LOW (ref 36.0–46.0)
Platelets: 129 10*3/uL — ABNORMAL LOW (ref 150–400)
RBC: 3.73 MIL/uL — ABNORMAL LOW (ref 3.87–5.11)
RDW: 16.5 % — ABNORMAL HIGH (ref 11.5–15.5)
WBC: 10.4 10*3/uL (ref 4.0–10.5)

## 2012-03-08 LAB — GLUCOSE, CAPILLARY
Glucose-Capillary: 81 mg/dL (ref 70–99)
Glucose-Capillary: 137 mg/dL — ABNORMAL HIGH (ref 70–99)
Glucose-Capillary: 116 mg/dL — ABNORMAL HIGH (ref 70–99)

## 2012-03-08 LAB — BASIC METABOLIC PANEL
CO2: 33 mEq/L — ABNORMAL HIGH (ref 19–32)
Calcium: 8.8 mg/dL (ref 8.4–10.5)
GFR calc non Af Amer: 67 mL/min — ABNORMAL LOW (ref >90)
Glucose, Bld: 109 mg/dL — ABNORMAL HIGH (ref 70–99)
Potassium: 3 mEq/L — ABNORMAL LOW (ref 3.5–5.1)
Sodium: 144 mEq/L (ref 135–145)

## 2012-03-08 LAB — CULTURE, BLOOD (ROUTINE X 2)

## 2012-03-08 MED ORDER — POTASSIUM CHLORIDE CRYS ER 20 MEQ PO TBCR
40.0000 meq | EXTENDED_RELEASE_TABLET | Freq: Once | ORAL | Status: AC
  Administered 2012-03-08: 40 meq via ORAL
  Filled 2012-03-08: qty 2

## 2012-03-08 NOTE — Progress Notes (Signed)
Name: [name] [name] MRN: [id number] DOB: 1929/02/25    LOS: 7  PULMONARY / CRITICAL CARE MEDICINE  Brief patient description:  78 yo admitted 7/20 with pneumonia and acute respiratory failure requiring intubation.  In the ED developed subcutaneous emphysema likely secondary to tracheal trauma during intubation.  Events since admission: 7/20  Admitted for pneumonia / acute respiratory failure, intubate 7/21  Chest CT demonstrated subcutaneous emphysema and pneumomediastinum.  Sepsis protocol completed. 7/24  Extubated  Interval history:  Again reports she is "better". Back hurts some, lying in bed. Waiting to get up. Denies chest or abdominal pain, N/V.  Vital Signs: Temp:  [97.8 F (36.6 C)-98.8 F (37.1 C)] 98.8 F (37.1 C) (07/27 0416) Pulse Rate:  [63-87] 75  (07/27 0416) Resp:  [15-27] 26  (07/27 0416) BP: (102-141)/(51-61) 127/61 mmHg (07/27 0416) SpO2:  [93 %-100 %] 100 % (07/27 0830) FiO2 (%):  [32 %] 32 % (07/27 0830)  Physical Examination: General:  Comfortable- was sleeping soundly, woke to voice and light touch. Neuro:  Cooperative HEENT:  PERRL Neck:  No JVD   Cardiovascular:  RRR, no M/R/G Lungs: Unlabored, rhonchi RLL, poor airflow, rattle cough- nonproductive. Abdomen:  Soft, nontender, nondistended, bowel sounds present Musculoskeletal:  Moves all extremities, weak Skin: SQ crepitus c/w SQ emphysema  ASSESSMENT AND PLAN  PULMONARY  Lab 03/03/12 1044 03/02/12 0343 03/01/12 2057  PHART 7.325* 7.401 7.377  PCO2ART 47.5* 42.8 51.8*  PO2ART 93.0 72.0* 198.0*  HCO3 24.8* 26.6* 29.7*  O2SAT 97.0 94.0 100.0   Ventilator Settings: Vent Mode:  [-]  FiO2 (%):  [32 %] 32 % CXR:  None today ETT:  7/20 >>>7/24  A:  Aspiration pneumonia with H.flu.  Acute respiratory failure.  Pneumomediastinum / pneumopericardium, likely secondary to traumatic intubation. P:   Bronchodilators Renew mucomyst Incentive spirometry Add flutter valve- encouraged  use -CXR in AM  CARDIOVASCULAR  Lab 03/02/12 2141 03/02/12 1341 03/02/12 0550 03/02/12 0345 03/01/12 2027  TROPONINI 0.53* 0.60* 0.81* -- --  LATICACIDVEN -- -- -- 2.3* 4.5*  PROBNP -- -- -- -- --   Lines: PICC L 7/24 >>>  A: Sepsis, resolved.  Demand ischemia.  Dyslipidemia.  Hypertension, better control. P:  ASA change to PO Increase Metoprolol 50 PO q12h (home dose) Increase Norvasc to 10 Lipitor  RENAL  Lab 03/08/12 0410 03/07/12 0930 03/06/12 0500 03/05/12 0455 03/04/12 0355 03/02/12 0345  NA 144 141 152* 149* 151* --  K 3.0* 3.3* -- -- -- --  CL 103 102 116* 115* 117* --  CO2 33* 32 30 23 23  --  BUN 21 17 28* 39* 42* --  CREATININE 0.80 0.65 0.77 0.92 1.04 --  CALCIUM 8.8 8.2* 8.2* 8.0* 7.9* --  MG -- -- -- -- -- 2.0  PHOS -- -- -- -- -- 3.4   Intake/Output      07/26 0701 - 07/27 0700 07/27 0701 - 07/28 0700   P.O. 60    I.V. (mL/kg) 200 (3)    NG/GT 920    IV Piggyback 50    Total Intake(mL/kg) 1230 (18.6)    Urine (mL/kg/hr) 1230 (0.8)    Stool 3    Total Output 1233    Net -3         Stool Occurrence 1 x     Foley:  7/20  A:  Normal renal function.  Hypernatremia, inproving.  Hypokalemia. P:   -Add po KCL x 1, recheck BMP   GASTROINTESTINAL  Lab 03/06/12  0500 03/04/12 0355 03/01/12 1959  AST 97* 774* 5010*  ALT 677* 1649* 3160*  ALKPHOS 115 164* 95  BILITOT 0.3 0.5 0.6  PROT 5.1* 5.4* 6.7  ALBUMIN 2.1* 2.1* 3.0*   A:  Shocked liver, improving.  Low likelihood of esophageal perforation.  P:   Continue TF SLP is following, will reevaluate  HEMATOLOGIC  Lab 03/08/12 0410 03/07/12 0930 03/06/12 0500 03/05/12 0455 03/04/12 0355 03/01/12 1959  HGB 11.5* 12.5 11.4* 11.3* 12.1 --  HCT 34.8* 38.3 34.6* 34.3* 36.8 --  PLT 129* 113* 110* 109* 120* --  INR -- -- -- -- -- 1.40  APTT -- -- -- -- -- 33   A:  Thrombocytopenia, stable P:  CBC today and in AM  INFECTIOUS  Lab 03/08/12 0410 03/07/12 0930 03/06/12 0500 03/05/12 0455 03/04/12  0355 03/03/12 0330 03/01/12 1959  WBC 10.4 12.4* 15.5* 16.7* 22.1* -- --  PROCALCITON -- -- -- -- -- 32.25 17.31   Cultures: 7/20  Blood  >>> Haemophilus (beta lactamase positive) 7/21  Blood >>> Haemophilus (beta lactamase positive) 7/21  Urine >>> Diphtheroids  Antibiotics: Zosyn  7/20 >>> 7/24 Vancomycin 7/20 >>> 7/24 Ceftriaxone 7/24 >>>  A:  Aspiration pneumonia / CAP P:   Antibiotics / cultures as above (discused with Clinical Pharmacist) -F/u CXR ENDOCRINE  Lab 03/08/12 0759 03/08/12 0333 03/07/12 2348 03/07/12 2004 03/07/12 1559  GLUCAP 81 111* 105* 145* 95   A:  Hyperglycemia.  Hypothyroidism. P:   CBG/SSI Lantus 10 Synthroid  NEUROLOGIC  A:  Acute encephalopathy secondary to sedation. P:   Monitor PT/OT  BEST PRACTICE / DISPOSITION Level of Care:  Downgrade to SDU Primary Service:  PCCM Consultants:  None Code Status:  DNR (ok to intubate) Diet:  TF, awaiting SLP reevaluation DVT Px:  Heparin GI Px:  Not indicated Skin Integrity:  Intact Social / Family:  Not available for rounds  C.D. Trinita Devlin, MD 03/08/2012, 9:53 AM

## 2012-03-08 NOTE — Progress Notes (Signed)
Feeding tube dc'd per md orders intact. Pt tolerated without event. Rt nare no signs of skin breakdown. No complaints. Cont to watch

## 2012-03-09 ENCOUNTER — Inpatient Hospital Stay (HOSPITAL_COMMUNITY): Payer: Medicare Other

## 2012-03-09 LAB — BASIC METABOLIC PANEL
CO2: 34 mEq/L — ABNORMAL HIGH (ref 19–32)
Calcium: 8.6 mg/dL (ref 8.4–10.5)
Creatinine, Ser: 0.73 mg/dL (ref 0.50–1.10)
GFR calc Af Amer: >90 mL/min (ref >90)
GFR calc non Af Amer: 78 mL/min — ABNORMAL LOW (ref >90)
Sodium: 143 mEq/L (ref 135–145)

## 2012-03-09 LAB — GLUCOSE, CAPILLARY
Glucose-Capillary: 73 mg/dL (ref 70–99)
Glucose-Capillary: 80 mg/dL (ref 70–99)

## 2012-03-09 MED ORDER — ADULT MULTIVITAMIN W/MINERALS CH
1.0000 | ORAL_TABLET | Freq: Every day | ORAL | Status: DC
  Administered 2012-03-10 – 2012-03-13 (×4): 1 via ORAL
  Filled 2012-03-09 (×5): qty 1

## 2012-03-09 NOTE — Progress Notes (Signed)
Pt unable to void; Bladder scan indicates pt has 600 ml of urine in bladder; MD notified; order received to insert foley; will continue to monitor

## 2012-03-09 NOTE — Progress Notes (Signed)
Name: [name] [name] MRN: [id number] DOB: 1928-10-07    LOS: 8  PULMONARY / CRITICAL CARE MEDICINE  Brief patient description:  78 yo admitted 7/20 with pneumonia and acute respiratory failure requiring intubation.  In the ED developed subcutaneous emphysema likely secondary to tracheal trauma during intubation.  Events since admission: 7/20  Admitted for pneumonia / acute respiratory failure, intubate 7/21  Chest CT demonstrated subcutaneous emphysema and pneumomediastinum.  Sepsis protocol completed. 7/24  Extubated  Interval history:  Being put on bedpan. Nurses note feeding tube out, not diabetic, good p.o. Accucheks good. Patient says breathing ok. Residual soreness substernal..  Vital Signs: Temp:  [97.6 F (36.4 C)-98.6 F (37 C)] 98.5 F (36.9 C) (07/28 0742) Pulse Rate:  [75-92] 81  (07/28 0742) Resp:  [16-24] 17  (07/28 0742) BP: (132-169)/(56-87) 155/68 mmHg (07/28 0742) SpO2:  [88 %-95 %] 90 % (07/28 0853)  Physical Examination: General:  NAD, being put on bedpan. Neuro:  Cooperative HEENT:  PERRL Neck:  No JVD   Cardiovascular:  RRR, no M/R/G Lungs: Unlabored, quiet chest anteriorly, no anterior rub. Abdomen:  Soft, nontender, nondistended, bowel sounds present Musculoskeletal:  Moves all extremities, weak Skin:  Less SQ crepitus c/w SQ emphysema  ASSESSMENT AND PLAN  PULMONARY  Lab 03/03/12 1044  PHART 7.325*  PCO2ART 47.5*  PO2ART 93.0  HCO3 24.8*  O2SAT 97.0   Ventilator Settings:   CXR:  -image reviewed- improving L pneumonia, residual consolidation LLL ETT:  7/20 >>>7/24  A:  Aspiration pneumonia with H.flu.  Acute respiratory failure.  Pneumomediastinum / pneumopericardium, likely secondary to traumatic intubation. P:   Bronchodilators Renew mucomyst Incentive spirometry Add flutter valve- encouraged use With tubes out, mobilization should help pulm toilet.  CARDIOVASCULAR  Lab 03/02/12 2141 03/02/12 1341  TROPONINI 0.53*  0.60*  LATICACIDVEN -- --  PROBNP -- --   Lines: PICC L 7/24 >>>  A: Sepsis, resolved.  Demand ischemia.  Dyslipidemia.  Hypertension, better control. P:  ASA change to PO Increase Metoprolol 50 PO q12h (home dose) Increase Norvasc to 10 Lipitor  RENAL  Lab 03/09/12 0500 03/08/12 0410 03/07/12 0930 03/06/12 0500 03/05/12 0455  NA 143 144 141 152* 149*  K 3.3* 3.0* -- -- --  CL 103 103 102 116* 115*  CO2 34* 33* 32 30 23  BUN 19 21 17  28* 39*  CREATININE 0.73 0.80 0.65 0.77 0.92  CALCIUM 8.6 8.8 8.2* 8.2* 8.0*  MG -- -- -- -- --  PHOS -- -- -- -- --   Intake/Output      07/27 0701 - 07/28 0700 07/28 0701 - 07/29 0700   P.O. 1260    I.V. (mL/kg)     NG/GT 200    IV Piggyback 50    Total Intake(mL/kg) 1510 (22.8)    Urine (mL/kg/hr) 2350 (1.5)    Stool     Total Output 2350    Net -840          Foley:  7/20  A:  Normal renal function.  Hypernatremia, inproving.  Hypokalemia. P:   -watch K on diet.   GASTROINTESTINAL  Lab 03/06/12 0500 03/04/12 0355  AST 97* 774*  ALT 677* 1649*  ALKPHOS 115 164*  BILITOT 0.3 0.5  PROT 5.1* 5.4*  ALBUMIN 2.1* 2.1*   A:  Shocked liver, improving.  Low likelihood of esophageal perforation.  P:   Continue TF SLP is following, will reevaluate  HEMATOLOGIC  Lab 03/08/12 0410 03/07/12 0930 03/06/12 0500 03/05/12  0455 03/04/12 0355  HGB 11.5* 12.5 11.4* 11.3* 12.1  HCT 34.8* 38.3 34.6* 34.3* 36.8  PLT 129* 113* 110* 109* 120*  INR -- -- -- -- --  APTT -- -- -- -- --   A:  Thrombocytopenia, stable P:  No intervention  INFECTIOUS  Lab 03/08/12 0410 03/07/12 0930 03/06/12 0500 03/05/12 0455 03/04/12 0355 03/03/12 0330  WBC 10.4 12.4* 15.5* 16.7* 22.1* --  PROCALCITON -- -- -- -- -- 32.25   Cultures: 7/20  Blood  >>> Haemophilus (beta lactamase positive) 7/21  Blood >>> Haemophilus (beta lactamase positive) 7/21  Urine >>> Diphtheroids  Antibiotics: Zosyn  7/20 >>> 7/24 Vancomycin 7/20 >>> 7/24 Ceftriaxone 7/24  >>>  A:  Aspiration pneumonia / CAP P:   Antibiotics / cultures as above (discused with Clinical Pharmacist) -F/u CXR ENDOCRINE  Lab 03/09/12 0802 03/09/12 0317 03/08/12 2324 03/08/12 1926 03/08/12 1631  GLUCAP 80 73 120* 106* 116*   A:  Hyperglycemia.  Hypothyroidism. P:   Not diabetic. Accucheks running 85 and feeding tube out with good po intake. Will d/c insulin Continue Synthroid  NEUROLOGIC  A:  Acute encephalopathy secondary to sedation. P:   improved Monitor PT/OT  BEST PRACTICE / DISPOSITION Level of Care:  Downgrade to SDU Primary Service:  PCCM Consultants:  None Code Status:  DNR (ok to intubate) Diet:  TF, awaiting SLP reevaluation DVT Px:  Heparin GI Px:  Not indicated Skin Integrity:  Intact Social / Family:  Not available for rounds  C.D. Young, MD 03/09/2012, 10:17 AM

## 2012-03-09 NOTE — Progress Notes (Signed)
Foley cath dc'd at 1030, pt unable to void. Bladder scan done and 466cc's recorded in bladder.  I/O cath done per protocol 700cc's out. Pt feels better no complaints cont to monitor.

## 2012-03-09 NOTE — Plan of Care (Signed)
Problem: Phase II Progression Outcomes Goal: Progress activity as tolerated unless otherwise ordered Outcome: Completed/Met Date Met:  03/09/12 To chair with stedy Goal: Discharge plan established Outcome: Progressing Social work to speak with family Goal: Obtain order to discontinue catheter if appropriate Outcome: Completed/Met Date Met:  03/09/12 dc'd 03/09/2012

## 2012-03-10 DIAGNOSIS — N179 Acute kidney failure, unspecified: Secondary | ICD-10-CM

## 2012-03-10 LAB — BASIC METABOLIC PANEL
BUN: 16 mg/dL (ref 6–23)
CO2: 35 mEq/L — ABNORMAL HIGH (ref 19–32)
Calcium: 8.8 mg/dL (ref 8.4–10.5)
GFR calc non Af Amer: 77 mL/min — ABNORMAL LOW (ref >90)
Glucose, Bld: 182 mg/dL — ABNORMAL HIGH (ref 70–99)
Sodium: 143 mEq/L (ref 135–145)

## 2012-03-10 LAB — GLUCOSE, CAPILLARY: Glucose-Capillary: 112 mg/dL — ABNORMAL HIGH (ref 70–99)

## 2012-03-10 MED ORDER — ENSURE PUDDING PO PUDG
1.0000 | Freq: Three times a day (TID) | ORAL | Status: DC
  Administered 2012-03-10 – 2012-03-13 (×9): 1 via ORAL

## 2012-03-10 NOTE — Progress Notes (Signed)
Utilization Review Completed.  

## 2012-03-10 NOTE — Progress Notes (Signed)
Name: [name] [name] MRN: [id number] DOB: Nov 15, 1928    LOS: 9  PULMONARY / CRITICAL CARE MEDICINE  Brief patient description:  78 yo admitted 7/20 with pneumonia and acute respiratory failure requiring intubation.  In the ED developed subcutaneous emphysema likely secondary to tracheal trauma during intubation.  Events since admission: 7/20  Admitted for pneumonia / acute respiratory failure, intubate 7/21  Chest CT demonstrated subcutaneous emphysema and pneumomediastinum.  Sepsis protocol completed. 7/24  Extubated  Interval history:  No distress  Vital Signs: Temp:  [97.9 F (36.6 C)-98.8 F (37.1 C)] 97.9 F (36.6 C) (07/29 0751) Pulse Rate:  [70-89] 70  (07/28 2325) Resp:  [19-25] 19  (07/28 2325) BP: (137-159)/(60-67) 137/61 mmHg (07/29 0751) SpO2:  [91 %-96 %] 93 % (07/29 0901) Weight:  [63 kg (138 lb 14.2 oz)] 63 kg (138 lb 14.2 oz) (07/29 0300)  Physical Examination: General:  No acute distress Neuro:  Cooperative HEENT:  PERRL Neck:  No JVD   Cardiovascular:  RRR, no M/R/G Lungs: Unlabored, reduced Abdomen:  Soft, nontender, nondistended, bowel sounds present Musculoskeletal:  Moves all extremities, weak Skin:  Less SQ crepitus c/w SQ emphysema  ASSESSMENT AND PLAN  PULMONARY No results found for this basename: PHART:5,PCO2:5,PCO2ART:5,PO2ART:5,HCO3:5,O2SAT:5 in the last 168 hours Ventilator Settings:   CXR:  -image reviewed- improving L pneumonia, residual consolidation LLL ETT:  7/20 >>>7/24  A:  Aspiration pneumonia with H.flu.  Acute respiratory failure.  Pneumomediastinum / pneumopericardium, likely secondary to traumatic intubation. P:   Bronchodilators Renew mucomyst, limit at 48 hrs Incentive spirometry flutter valve- encouraged use Continue neg balance, asses bmet now Repeat pcxr for effusion, int edema in am   CARDIOVASCULAR No results found for this basename: TROPONINI:5,LATICACIDVEN:5, O2SATVEN:5,PROBNP:5 in the last 168  hours Lines: PICC L 7/24 >>>  A: Sepsis, resolved.  Demand ischemia.  Dyslipidemia.  Hypertension, better control. P:  ASA  PO Metoprolol 50 PO q12h (home dose) Norvasc to 10 Lipitor  RENAL  Lab 03/09/12 0500 03/08/12 0410 03/07/12 0930 03/06/12 0500 03/05/12 0455  NA 143 144 141 152* 149*  K 3.3* 3.0* -- -- --  CL 103 103 102 116* 115*  CO2 34* 33* 32 30 23  BUN 19 21 17  28* 39*  CREATININE 0.73 0.80 0.65 0.77 0.92  CALCIUM 8.6 8.8 8.2* 8.2* 8.0*  MG -- -- -- -- --  PHOS -- -- -- -- --   Intake/Output      07/28 0701 - 07/29 0700 07/29 0701 - 07/30 0700   P.O. 340    NG/GT     IV Piggyback 50    Total Intake(mL/kg) 390 (6.2)    Urine (mL/kg/hr) 2350 (1.6) 950 (3.5)   Total Output 2350 950   Net -1960 -950        Urine Occurrence 2 x    Stool Occurrence 4 x     Foley:  7/20  A:  Normal renal function.  Hypernatremia, inproving.  Hypokalemia. P:   -with neg balance over 2 liters, assess bmet now and in am  -assess needs lasix after review lytes  GASTROINTESTINAL  Lab 03/06/12 0500 03/04/12 0355  AST 97* 774*  ALT 677* 1649*  ALKPHOS 115 164*  BILITOT 0.3 0.5  PROT 5.1* 5.4*  ALBUMIN 2.1* 2.1*   A:  Shocked liver, improving.  Low likelihood of esophageal perforation.  P:   SLP is following, will reevaluate, at bedside now  HEMATOLOGIC  Lab 03/08/12 0410 03/07/12 0930 03/06/12 0500 03/05/12 0455  03/04/12 0355  HGB 11.5* 12.5 11.4* 11.3* 12.1  HCT 34.8* 38.3 34.6* 34.3* 36.8  PLT 129* 113* 110* 109* 120*  INR -- -- -- -- --  APTT -- -- -- -- --   A:  Thrombocytopenia, stable P:  No intervention dvt prevention  INFECTIOUS  Lab 03/08/12 0410 03/07/12 0930 03/06/12 0500 03/05/12 0455 03/04/12 0355  WBC 10.4 12.4* 15.5* 16.7* 22.1*  PROCALCITON -- -- -- -- --   Cultures: 7/20  Blood  >>> Haemophilus (beta lactamase positive) 7/21  Blood >>> Haemophilus (beta lactamase positive) 7/21  Urine >>> Diphtheroids  Antibiotics: Zosyn  7/20 >>>  7/24 Vancomycin 7/20 >>> 7/24 Ceftriaxone 7/24 >>>  A:  Aspiration pneumonia / CAP P:   Antibiotics / cultures as above (discused with Clinical Pharmacist) -F/u CXR Add stop date, total 10 days planned with bacteremia after last pos BC bottle dated 7/21  ENDOCRINE  Lab 03/09/12 0802 03/09/12 0317 03/08/12 2324 03/08/12 1926 03/08/12 1631  GLUCAP 80 73 120* 106* 116*   A:  Hyperglycemia.  Hypothyroidism. P:   Controlled on own Monitor as diet added  NEUROLOGIC  A:  Acute encephalopathy secondary to sedation. P:   improved Monitor PT/OT  BEST PRACTICE / DISPOSITION Level of Care:SDU Primary Service:  PCCM Consultants:  None Code Status:  DNR (ok to intubate) Diet:  TF, awaiting SLP reevaluation DVT Px:  Heparin GI Px:  Not indicated Skin Integrity:  Intact Social / Family:  Not available for rounds  Mcarthur Rossetti. Tyson Alias, MD, FACP Pgr: [phone] [facility]

## 2012-03-10 NOTE — Progress Notes (Signed)
Clinical Social Work  Several SNFs reported they were unable to open request for SNF on TLC. CSW uploaded clinicals and refaxed information. CSW will follow up with bed offers.  [place], [place] [phone]

## 2012-03-10 NOTE — Progress Notes (Signed)
Clinical Social Work  CSW spoke with grandson Dorinda Hill) who reported that he preferred [facility] since it was close to his home and patient's apartment. CSW explained dc process. CSW called SNF and left a message regarding patient's interest. CSW will continue to follow.  [place], [place] [phone]

## 2012-03-10 NOTE — Progress Notes (Signed)
Clinical Social Work  CSW met with patient in order to discuss SNF options. CSW left SNF list with patient but patient reported that she is not familiar with the area and her grandsons are assisting with placement. CSW received permission to speak with family. CSW called grandson Brett Canales) who reported that grandson Dorinda Hill) is POA and to speak with him. CSW called Dorinda Hill and left a message with CSW contact information. CSW will continue to follow.  [place], [place] [phone]

## 2012-03-10 NOTE — Progress Notes (Signed)
Physical Therapy Treatment Patient Details Name: [name] [name] MRN: [id number] DOB: 1929/02/27 Today's Date: 03/10/2012 Time: 2952-8413 PT Time Calculation (min): 23 min  PT Assessment / Plan / Recommendation Comments on Treatment Session  pt was able to participate with transfers and short walk in the RW and keep her sats in the Low 90's% on 3L [place]    Follow Up Recommendations  Skilled nursing facility    Barriers to Discharge        Equipment Recommendations  Defer to next venue    Recommendations for Other Services    Frequency     Plan Discharge plan remains appropriate    Precautions / Restrictions Precautions Precautions: Fall   Pertinent Vitals/Pain 90-95% on 3 L [place]    Mobility  Bed Mobility Bed Mobility: Supine to Sit;Sitting - Scoot to Edge of Bed Supine to Sit: 1: +2 Total assist Supine to Sit: Patient Percentage: 40% Sitting - Scoot to Edge of Bed: 1: +1 Total assist Sitting - Scoot to Edge of Bed: Patient Percentage: 40% Details for Bed Mobility Assistance: Pt needed verbal/tactile cues for technique.; stability assist Transfers Transfers: Sit to Stand;Stand to Sit Sit to Stand: 1: +2 Total assist;With upper extremity assist;From bed Sit to Stand: Patient Percentage: 40% Stand to Sit: Without upper extremity assist;1: +2 Total assist;To chair/3-in-1 Stand to Sit: Patient Percentage: 40% Details for Transfer Assistance: vc's for hand placement/technique, assist for stability and balance Ambulation/Gait Ambulation/Gait Assistance: 1: +2 Total assist Ambulation/Gait: Patient Percentage: 40% Ambulation Distance (Feet): 6 Feet Assistive device: Rolling walker Ambulation/Gait Assistance Details: vc's for hand placement, safety in RW; assist for maneuvering and stability Gait Pattern: Step-through pattern;Decreased stride length;Decreased step length - right;Decreased step length - left;Trunk flexed    Exercises     PT Diagnosis:    PT Problem List:     PT Treatment Interventions:     PT Goals Acute Rehab PT Goals PT Goal Formulation: With patient Time For Goal Achievement: 03/07/12 Potential to Achieve Goals: Fair PT Goal: Supine/Side to Sit - Progress: Progressing toward goal PT Goal: Sit at Edge Of Bed - Progress: Progressing toward goal PT Goal: Sit to Supine/Side - Progress: Progressing toward goal PT Goal: Sit to Stand - Progress: Progressing toward goal PT Goal: Stand to Sit - Progress: Progressing toward goal PT Goal: Ambulate - Progress: Progressing toward goal  Visit Information  Last PT Received On: 03/10/12 Assistance Needed: +2    Subjective Data  Subjective: Nice to meet you   Cognition  Overall Cognitive Status: Impaired Area of Impairment: Following commands Arousal/Alertness: Awake/alert Orientation Level: Appears intact for tasks assessed Behavior During Session: Flat affect    Balance  Balance Balance Assessed: Yes Static Sitting Balance Static Sitting - Balance Support: Right upper extremity supported;Left upper extremity supported;Feet supported Static Sitting - Level of Assistance: 5: Stand by assistance (list L with fatigue)  End of Session PT - End of Session Equipment Utilized During Treatment: Gait belt Activity Tolerance: Patient limited by fatigue Patient left: in chair;with call bell/phone within reach Nurse Communication: Mobility status   GP     Delsa Walder, Eliseo Gum 03/10/2012, 2:33 PM  03/10/2012  [place] Bing, PT [phone] (pager)

## 2012-03-10 NOTE — Progress Notes (Signed)
Nutrition Follow-up  Intervention:   Ensure Pudding TID between meals, each supplement provides 170 kcal and 4 grams of protein.   Assessment:   S/P bedside swallow evaluation with SLP 7/26, patient with mild pharyngeal phase dysphagia per SLP note.  Intake has been good; consuming 75-100% of meals.  RN requests Ensure pudding for patient to take her medications with.  Diet Order:  Dysphagia 1 with thin liquids  Meds: Scheduled Meds:    . ipratropium  0.5 mg Nebulization Q6H   And  . albuterol  2.5 mg Nebulization Q6H  . amLODipine  10 mg Oral Daily  . aspirin  325 mg Oral Daily  . atorvastatin  10 mg Oral QHS  . cefTRIAXone (ROCEPHIN)  IV  2 g Intravenous Q24H  . heparin  5,000 Units Subcutaneous Q8H  . levothyroxine  175 mcg Oral Daily  . metoprolol tartrate  50 mg Oral BID  . multivitamin with minerals  1 tablet Oral Daily  . sodium chloride  10-40 mL Intracatheter Q12H   Continuous Infusions:  PRN Meds:.sodium chloride, acetaminophen (TYLENOL) oral liquid 160 mg/5 mL, sodium chloride  Labs:  CMP     Component Value Date/Time   NA 143 03/09/2012 0500   K 3.3* 03/09/2012 0500   CL 103 03/09/2012 0500   CO2 34* 03/09/2012 0500   GLUCOSE 79 03/09/2012 0500   BUN 19 03/09/2012 0500   CREATININE 0.73 03/09/2012 0500   CALCIUM 8.6 03/09/2012 0500   PROT 5.1* 03/06/2012 0500   ALBUMIN 2.1* 03/06/2012 0500   AST 97* 03/06/2012 0500   ALT 677* 03/06/2012 0500   ALKPHOS 115 03/06/2012 0500   BILITOT 0.3 03/06/2012 0500   GFRNONAA 78* 03/09/2012 0500   GFRAA >90 03/09/2012 0500     Intake/Output Summary (Last 24 hours) at 03/10/12 1209 Last data filed at 03/10/12 1157  Gross per 24 hour  Intake    270 ml  Output   3300 ml  Net  -3030 ml    CBG (last 3)   Basename 03/09/12 0802 03/09/12 0317 03/08/12 2324  GLUCAP 80 73 120*    Weight Status:  63 kg -- stable  Re-estimated needs:  1300-1500 kcals, 75-85 gm protein  Nutrition Dx:  Inadequate Oral Intake r/t inability to eat  as evidenced by NPO status, resolved.  Goal:  EN to meet >/= 90% of estimated nutrition needs, no longer applicable.  New Goal:  Intake to meet >90% of estimated nutrition needs, met.  Monitor:  PO intake, weight, labs, I/O's   Joaquin Courts, RD, CNSC, LDN Pager# [phone] After Hours Pager# [phone]

## 2012-03-10 NOTE — Progress Notes (Addendum)
Public Health RN called from Pt area: [phone] or [phone] ext. Re Haem flu. [facility] lab called them. Deferred call to ID.

## 2012-03-10 NOTE — Progress Notes (Signed)
Speech Language Pathology Dysphagia Treatment Patient Details Name: [name] [name] MRN: [id number] DOB: [birth year] Today's Date: 03/10/2012 Time: 9562-1308 SLP Time Calculation (min): 14 min  Assessment / Plan / Recommendation Clinical Impression  Pt demonstrated overt signs of aspiration at bedside with immediate cough with thin liquids as well as one episode of spike in respiratory rate puring PO intake. SLP provided minimal verbal cues to decrease quantity with sips of water, take rest braks between bites. Encouraged self feeding with assist from staff. Though FEES indicated pt was aspirating at that moment in time, do feel pt at incrased risk of aspiariton given multiple risk factors. With continue thin liquids and upgrade diet to finely minced (Dys 2) per pts requent and continue efforts at education and compensatory strategies to decrease risk.     Diet Recommendation  Initiate / Change Diet: Dysphagia 1 (puree);Thin liquid    SLP Plan Continue with current plan of care   Pertinent Vitals/Pain NA   Swallowing Goals  SLP Swallowing Goals Patient will utilize recommended strategies during swallow to increase swallowing safety with: Minimal assistance Swallow Study Goal #2 - Progress: Progressing toward goal  General Temperature Spikes Noted: No Respiratory Status: Supplemental O2 delivered via (comment) Behavior/Cognition: Alert;Cooperative;Requires cueing Oral Cavity - Dentition: Edentulous Patient Positioning: Upright in bed  Oral Cavity - Oral Hygiene Does patient have any of the following "at risk" factors?: Oxygen therapy - cannula, mask, simple oxygen devices Patient is AT RISK - Oral Care Protocol followed (see row info): Yes   Dysphagia Treatment Treatment focused on: Skilled observation of diet tolerance;Utilization of compensatory strategies Treatment Methods/Modalities: Skilled observation Patient observed directly with PO's: Yes Type of PO's observed: Thin  liquids;Dysphagia 1 (puree) Feeding: Able to feed self Liquids provided via: Cup Pharyngeal Phase Signs & Symptoms: Immediate cough;Immediate throat clear Type of cueing: Verbal Amount of cueing: Minimal   GO    Harlon Ditty, MA CCC-SLP [phone]  Claudine Mouton 03/10/2012, 1:51 PM

## 2012-03-11 ENCOUNTER — Inpatient Hospital Stay (HOSPITAL_COMMUNITY): Payer: Medicare Other

## 2012-03-11 LAB — BASIC METABOLIC PANEL
BUN: 15 mg/dL (ref 6–23)
Calcium: 9.5 mg/dL (ref 8.4–10.5)
Chloride: 101 mEq/L (ref 96–112)
Creatinine, Ser: 0.73 mg/dL (ref 0.50–1.10)
GFR calc Af Amer: >90 mL/min (ref >90)
GFR calc non Af Amer: 78 mL/min — ABNORMAL LOW (ref >90)

## 2012-03-11 LAB — MAGNESIUM: Magnesium: 2.2 mg/dL (ref 1.5–2.5)

## 2012-03-11 MED ORDER — POTASSIUM CHLORIDE 20 MEQ/15ML (10%) PO LIQD
40.0000 meq | ORAL | Status: AC
  Administered 2012-03-11 (×2): 40 meq via ORAL
  Filled 2012-03-11 (×2): qty 30

## 2012-03-11 MED ORDER — ALTEPLASE 2 MG IJ SOLR
2.0000 mg | Freq: Once | INTRAMUSCULAR | Status: AC
  Administered 2012-03-11: 2 mg
  Filled 2012-03-11: qty 2

## 2012-03-11 NOTE — Progress Notes (Signed)
Physical Therapy Treatment Patient Details Name: [name] [name] MRN: [id number] DOB: 1929-06-25 Today's Date: 03/11/2012 Time: 4098-1191 PT Time Calculation (min): 17 min  PT Assessment / Plan / Recommendation Comments on Treatment Session  Pt. making slow gains in her mobility.  She was more alert and interactive by the time she was up in the recliner. HR and sats stable throughout per tele monitor in room.    Follow Up Recommendations  Skilled nursing facility    Barriers to Discharge        Equipment Recommendations  Defer to next venue    Recommendations for Other Services    Frequency Min 2X/week   Plan Discharge plan remains appropriate    Precautions / Restrictions Precautions Precautions: Fall Restrictions Weight Bearing Restrictions: No   Pertinent Vitals/Pain No pain reported; vitals stable per tele monitor while up moving with max HR 103 and O2 sats on 3 1/2 L O2 95%to 100%.  No distress.    Mobility  Bed Mobility Bed Mobility: Supine to Sit;Sitting - Scoot to Edge of Bed Supine to Sit: 1: +2 Total assist Supine to Sit: Patient Percentage: 40% Sitting - Scoot to Edge of Bed: 1: +1 Total assist Sitting - Scoot to Edge of Bed: Patient Percentage: 40% Sit to Supine: Not Tested (comment) Details for Bed Mobility Assistance: vc's for technique and safety ; manual assist for task completion due to inconsistency in following directions. Transfers Transfers: Sit to Stand;Stand to Sit Sit to Stand: 1: +2 Total assist;With upper extremity assist;From bed Sit to Stand: Patient Percentage: 60% Stand to Sit: 1: +2 Total assist;With upper extremity assist;To chair/3-in-1;With armrests Stand to Sit: Patient Percentage: 60% Details for Transfer Assistance: vc's for safety and technique; manual assist to facilitate movement and for stability Ambulation/Gait Ambulation/Gait Assistance: 1: +2 Total assist Ambulation/Gait: Patient Percentage: 60% Ambulation Distance  (Feet): 4 Feet Assistive device: Rolling walker Ambulation/Gait Assistance Details: Assist needed to manage RW and for stabilizing pt. VC's for safety and technique. Gait Pattern: Step-through pattern;Decreased stride length;Decreased step length - right;Decreased step length - left;Trunk flexed    Exercises     PT Diagnosis:    PT Problem List:   PT Treatment Interventions:     PT Goals Acute Rehab PT Goals PT Goal: Supine/Side to Sit - Progress: Progressing toward goal PT Goal: Sit at Edge Of Bed - Progress: Progressing toward goal PT Goal: Sit to Stand - Progress: Progressing toward goal PT Goal: Stand to Sit - Progress: Progressing toward goal PT Goal: Ambulate - Progress: Progressing toward goal  Visit Information  Last PT Received On: 03/11/12 Assistance Needed: +2    Subjective Data  Subjective: no initial verbalizations   Cognition  Overall Cognitive Status: Impaired Area of Impairment: Following commands Arousal/Alertness: Lethargic Orientation Level: Appears intact for tasks assessed Behavior During Session: Lethargic Following Commands: Follows one step commands inconsistently    Balance  Balance Balance Assessed: Yes Static Sitting Balance Static Sitting - Balance Support: Right upper extremity supported;Left upper extremity supported;Feet supported Static Sitting - Level of Assistance: 5: Stand by assistance  End of Session PT - End of Session Equipment Utilized During Treatment: Gait belt;Oxygen Activity Tolerance: Patient limited by fatigue Patient left: in chair;with call bell/phone within reach Nurse Communication: Mobility status   GP     Ferman Hamming 03/11/2012, 10:21 AM Acute Rehabilitation Services [phone] (pager)

## 2012-03-11 NOTE — Progress Notes (Signed)
Clinical Social Work  CSW spoke with SNF([facility]) who reported that patient can admit when dc from the hospital. CSW agreed to keep facility updated on patient's dc plans. CSW will continue to follow.  [place], [place] [phone]

## 2012-03-11 NOTE — Progress Notes (Signed)
Report called to receiving RN on 6700.  Edwin Dada, notified of new room number.  Pt transferred via bed with O2 to 6727.  Roselie Awkward, RN

## 2012-03-11 NOTE — Progress Notes (Signed)
Occupational Therapy Treatment Patient Details Name: [name] [name] MRN: [id number] DOB: 1929/07/09 Today's Date: 03/11/2012 Time: 4098-1191 OT Time Calculation (min): 33 min  OT Assessment / Plan / Recommendation Comments on Treatment Session This 78 yo making slow progress, will continue to benefit from acute OT with follow up at SNF    Follow Up Recommendations  Skilled nursing facility    Barriers to Discharge       Equipment Recommendations  Defer to next venue    Recommendations for Other Services    Frequency Min 2X/week   Plan Discharge plan remains appropriate    Precautions / Restrictions Precautions Precautions: Fall Restrictions Weight Bearing Restrictions: No   Pertinent Vitals/Pain     ADL  Eating/Feeding: Performed;Minimal assistance (75% of meal, eating and drinking) Where Assessed - Eating/Feeding: Chair    OT Diagnosis:    OT Problem List:   OT Treatment Interventions:     OT Goals ADL Goals ADL Goal: Eating - Progress: Progressing toward goals  Visit Information  Last OT Received On: 03/11/12 Assistance Needed: +2 (for mobility)    Subjective Data      Prior Functioning       Cognition  Overall Cognitive Status: Impaired Area of Impairment: Attention;Following commands Arousal/Alertness: Awake/alert Behavior During Session: [facility] for tasks performed Current Attention Level: Sustained Following Commands: Follows one step commands with increased time    Mobility     Exercises    Balance    End of Session OT - End of Session Activity Tolerance: Patient tolerated treatment well Patient left: in chair;with call bell/phone within reach;with nursing in room Nurse Communication:  (Nurse reports PT informed them how to get pt back to bed)       Evette Georges [phone] 03/11/2012, 2:40 PM

## 2012-03-11 NOTE — Progress Notes (Signed)
Name: [name] [name] MRN: [id number] DOB: 07-15-1929    LOS: 10  PULMONARY / CRITICAL CARE MEDICINE  Brief patient description:  78 yo admitted 7/20 with pneumonia and acute respiratory failure requiring intubation.  In the ED developed subcutaneous emphysema likely secondary to tracheal trauma during intubation.  Events since admission: 7/20  Admitted for pneumonia / acute respiratory failure, intubate 7/21  Chest CT demonstrated subcutaneous emphysema and pneumomediastinum.  Sepsis protocol completed. 7/24  Extubated  Interval history:  More alert, coughing, neg 2.5 liters  Vital Signs: Temp:  [97.4 F (36.3 C)-98.8 F (37.1 C)] 97.4 F (36.3 C) (07/30 1127) Pulse Rate:  [81-98] 92  (07/30 0800) Resp:  [18-23] 23  (07/30 0800) BP: (145-176)/(57-95) 145/95 mmHg (07/30 0800) SpO2:  [90 %-98 %] 96 % (07/30 0817)  Physical Examination: General:  No acute distress Neuro:  Cooperative HEENT:  PERRL Neck:  No JVD   Cardiovascular:  RRR, no M/R/G Lungs: coarse improved Abdomen:  Soft, nontender, nondistended, bowel sounds present Musculoskeletal:  Moves all extremities, weak Skin:  Less SQ crepitus c/w SQ emphysema  ASSESSMENT AND PLAN  PULMONARY No results found for this basename: PHART:5,PCO2:5,PCO2ART:5,PO2ART:5,HCO3:5,O2SAT:5 in the last 168 hours Ventilator Settings:   CXR:  -image reviewed- improving L pneumonia, residual consolidation LLL ETT:  7/20 >>>7/24  A:  Aspiration pneumonia with H.flu.  Acute respiratory failure.  Pneumomediastinum / pneumopericardium, likely secondary to traumatic intubation. P:   Bronchodilators Renew mucomyst, limit at 48 hrs, allow to dc Incentive spirometry flutter valve Continue neg balance Repeat pcxr improved  CARDIOVASCULAR No results found for this basename: TROPONINI:5,LATICACIDVEN:5, O2SATVEN:5,PROBNP:5 in the last 168 hours Lines: PICC L 7/24 >>>  A: Sepsis, resolved.  Demand ischemia.  Dyslipidemia.   Hypertension, better control. P:  ASA  PO Metoprolol 50 PO q12h (home dose) Norvasc to 10 Lipitor likely can move out of SDU  RENAL  Lab 03/11/12 0342 03/10/12 1200 03/09/12 0500 03/08/12 0410 03/07/12 0930  NA 143 143 143 144 141  K 3.2* 3.2* -- -- --  CL 101 101 103 103 102  CO2 35* 35* 34* 33* 32  BUN 15 16 19 21 17   CREATININE 0.73 0.75 0.73 0.80 0.65  CALCIUM 9.5 8.8 8.6 8.8 8.2*  MG 2.2 -- -- -- --  PHOS 4.8* -- -- -- --   Intake/Output      07/29 0701 - 07/30 0700 07/30 0701 - 07/31 0700   P.O. 480 360   IV Piggyback     Total Intake(mL/kg) 480 (7.6) 360 (5.7)   Urine (mL/kg/hr) 3000 (2) 850 (2.3)   Total Output 3000 850   Net -2520 -490         Foley:  7/20  A:  Normal renal function.  Hypernatremia, inproving.  Hypokalemia. P:   -continue same balance goals, tolerated so well and improved pcxr findings -bmet in am  supp k   GASTROINTESTINAL  Lab 03/06/12 0500  AST 97*  ALT 677*  ALKPHOS 115  BILITOT 0.3  PROT 5.1*  ALBUMIN 2.1*   A:  Shocked liver, improving.  Low likelihood of esophageal perforation.  P:   SLP recs to follow Repeat lft in am   HEMATOLOGIC  Lab 03/08/12 0410 03/07/12 0930 03/06/12 0500 03/05/12 0455  HGB 11.5* 12.5 11.4* 11.3*  HCT 34.8* 38.3 34.6* 34.3*  PLT 129* 113* 110* 109*  INR -- -- -- --  APTT -- -- -- --   A:  Thrombocytopenia, stable P:  Neg balance  improving with this dvt prevention  INFECTIOUS  Lab 03/08/12 0410 03/07/12 0930 03/06/12 0500 03/05/12 0455  WBC 10.4 12.4* 15.5* 16.7*  PROCALCITON -- -- -- --   Cultures: 7/20  Blood  >>> Haemophilus (beta lactamase positive) 7/21  Blood >>> Haemophilus (beta lactamase positive) 7/21  Urine >>> Diphtheroids  Antibiotics: Zosyn  7/20 >>> 7/24 Vancomycin 7/20 >>> 7/24 Ceftriaxone 7/24 >>>  A:  Aspiration pneumonia / CAP P:   total 10 days planned with bacteremia after last pos BC bottle dated 7/21 No change to this plan, maintain  IV  ENDOCRINE  Lab 03/09/12 0802 03/09/12 0317 03/08/12 2324 03/08/12 1926 03/08/12 1631  GLUCAP 80 73 120* 106* 116*   A:  Hyperglycemia.  Hypothyroidism. P:   Controlled on own Monitor as diet added  NEUROLOGIC  A:  Acute encephalopathy secondary to sedation. P:   improved Monitor PT/OT  BEST PRACTICE / DISPOSITION Level of Care:SDU to floor Primary Service:  PCCM Consultants:  None Code Status:  DNR (ok to intubate) Diet:  SLP reevaluation DVT Px:  Heparin GI Px:  Not indicated Skin Integrity:  Intact Social / Family:  Not available for rounds  Mcarthur Rossetti. Tyson Alias, MD, FACP Pgr: [phone] [facility]

## 2012-03-12 ENCOUNTER — Inpatient Hospital Stay (HOSPITAL_COMMUNITY): Payer: Medicare Other

## 2012-03-12 LAB — COMPREHENSIVE METABOLIC PANEL
Albumin: 2.7 g/dL — ABNORMAL LOW (ref 3.5–5.2)
Alkaline Phosphatase: 92 U/L (ref 39–117)
BUN: 22 mg/dL (ref 6–23)
Calcium: 9.7 mg/dL (ref 8.4–10.5)
Creatinine, Ser: 0.95 mg/dL (ref 0.50–1.10)
GFR calc Af Amer: 63 mL/min — ABNORMAL LOW (ref >90)
Glucose, Bld: 116 mg/dL — ABNORMAL HIGH (ref 70–99)
Potassium: 4.2 mEq/L (ref 3.5–5.1)
Total Protein: 6.2 g/dL (ref 6.0–8.3)

## 2012-03-12 MED ORDER — ADULT MULTIVITAMIN W/MINERALS CH: 1.0000 | ORAL_TABLET | Freq: Every day | ORAL | Status: DC

## 2012-03-12 MED ORDER — METOPROLOL TARTRATE 50 MG PO TABS: 50.0000 mg | ORAL_TABLET | Freq: Two times a day (BID) | ORAL | Status: DC

## 2012-03-12 MED ORDER — ALBUTEROL SULFATE (5 MG/ML) 0.5% IN NEBU: 2.5000 mg | INHALATION_SOLUTION | Freq: Four times a day (QID) | RESPIRATORY_TRACT | Status: DC

## 2012-03-12 MED ORDER — POTASSIUM CHLORIDE 20 MEQ/15ML (10%) PO LIQD
40.0000 meq | Freq: Once | ORAL | Status: AC
  Administered 2012-03-12: 40 meq via ORAL
  Filled 2012-03-12 (×2): qty 30

## 2012-03-12 MED ORDER — AMLODIPINE BESYLATE 10 MG PO TABS: 10.0000 mg | ORAL_TABLET | Freq: Every day | ORAL | Status: DC

## 2012-03-12 MED ORDER — FUROSEMIDE 10 MG/ML IJ SOLN
40.0000 mg | Freq: Once | INTRAMUSCULAR | Status: AC
  Administered 2012-03-12: 40 mg via INTRAVENOUS
  Filled 2012-03-12: qty 4

## 2012-03-12 MED ORDER — ASPIRIN 325 MG PO TABS: 325.0000 mg | ORAL_TABLET | Freq: Every day | ORAL | Status: AC

## 2012-03-12 MED ORDER — IPRATROPIUM BROMIDE 0.02 % IN SOLN: 0.5000 mg | Freq: Four times a day (QID) | RESPIRATORY_TRACT | Status: DC

## 2012-03-12 MED ORDER — ENSURE PUDDING PO PUDG: 1.0000 | Freq: Three times a day (TID) | ORAL | Status: DC

## 2012-03-12 NOTE — Progress Notes (Signed)
Mild subcutaneous emphysema in upper chest.

## 2012-03-12 NOTE — Progress Notes (Signed)
Name: [name] [name] MRN: [id number] DOB: 09-May-1929    LOS: 11  PULMONARY / CRITICAL CARE MEDICINE  Brief patient description:  78 yo admitted 7/20 with pneumonia and acute respiratory failure requiring intubation.  In the ED developed subcutaneous emphysema likely secondary to tracheal trauma during intubation.  ETT:  7/20 >>>7/24 Lines: PICC L 7/24 >>>  Cultures: 7/20  Blood  >>> Haemophilus (beta lactamase positive) 7/21  Blood >>> Haemophilus (beta lactamase positive) 7/21  Urine >>> Diphtheroids  Antibiotics: Zosyn  7/20 >>> 7/24 Vancomycin 7/20 >>> 7/24 Ceftriaxone 7/24 >>>  Events since admission: 7/20  Admitted for pneumonia / acute respiratory failure, intubate 7/21  Chest CT demonstrated subcutaneous emphysema and pneumomediastinum.  Sepsis protocol completed. 7/24  Extubated  Interval history:   Called to room. Pt w/ increased work of breathing compared with this am. Marked upper airway wheeze. + lower airway wheeze. + accessory muscle use.   Vital Signs: Temp:  [97.6 F (36.4 C)-98.7 F (37.1 C)] 97.9 F (36.6 C) (07/31 1313) Pulse Rate:  [64-94] 94  (07/31 1313) Resp:  [16-20] 20  (07/31 1313) BP: (126-155)/(55-82) 140/79 mmHg (07/31 1313) SpO2:  [91 %-96 %] 95 % (07/31 1313) Weight:  [59.5 kg (131 lb 2.8 oz)] 59.5 kg (131 lb 2.8 oz) (07/30 2127) 2 liters  Physical Examination: General: + accessory muscle use, mild distress Neuro:  Cooperative, alert no focal def HEENT:  PERRL Neck:  No JVD  + upper airway wheeze.  Cardiovascular:  RRR, no M/R/G Lungs: wheeze t/o w/ focal upper airway wheeze and rhonchi  Abdomen:  Soft, nontender, nondistended, bowel sounds present Musculoskeletal:  Moves all extremities, weak Skin:  Less SQ crepitus c/w SQ emphysema  ASSESSMENT AND PLAN   Acute respiratory failure due to Aspiration pneumonia with H.flu, c/b Pneumomediastinum / pneumopericardium, likely secondary to traumatic intubation. P:   Stat PCXR  ? Aspiration  Bronchodilators scheduled Incentive spirometry flutter valve Continue neg balance Lasix X 1 Hold transfer today   Sepsis, resolved.  Demand ischemia.  Dyslipidemia.  Hypertension, better control. P:  ASA  PO Metoprolol 50 PO q12h (home dose) Norvasc to 10 Lipitor   Hypernatremia, inproving.  Hypokalemia.  Lab 03/12/12 0620 03/11/12 0342 03/10/12 1200  K 4.2 3.2* 3.2*  NA 146* 143 143  P:   -push po fluids    Shocked liver, improving.  Low likelihood of esophageal perforation.   Lab 03/12/12 0620 03/06/12 0500  ALT 108* 677*  AST 31 97*  GGT -- --  ALKPHOS 92 115  BILITOT 0.4 0.3  P:   Repeat lft 1 week   A:  Thrombocytopenia, stable  Lab 03/08/12 0410 03/07/12 0930 03/06/12 0500  PLT 129* 113* 110*   P: dvt prevention  Aspiration pneumonia / CAP P:   Completed total 10 days planned with bacteremia after last pos BC bottle dated 7/21   Hyperglycemia.  Hypothyroidism.  Lab 03/09/12 0802 03/09/12 0317 03/08/12 2324 03/08/12 1926 03/08/12 1631  GLUCAP 80 73 120* 106* 116*   P:   Controlled on own Monitor as diet added  Acute encephalopathy secondary to resolved  Debility after prolonged critical illness P:   PT/OT  BEST PRACTICE / DISPOSITION Level of Care:SDU to floor Primary Service:  PCCM Consultants:  None Code Status:  DNR (ok to intubate) Diet:  SLP reevaluation DVT Px:  Heparin GI Px:  Not indicated Skin Integrity:  Intact Social / Family:  Not available for rounds  Patient seen and examined, agree  with above note.  I dictated the care and orders written for this patient under my direction.  Koren Bound, M.D. [phone]

## 2012-03-12 NOTE — Progress Notes (Signed)
Physical Therapy Treatment Patient Details Name: [name] [name] MRN: [id number] DOB: 1928-12-30 Today's Date: 03/12/2012 Time: 1191-4782 PT Time Calculation (min): 27 min  PT Assessment / Plan / Recommendation Comments on Treatment Session  Pt had been with labored breathing earlier today, so kept activity gentle while aiming at helping pt to the chair for better breathing in more upright position; o2 sats 100% at end of session in chair    Follow Up Recommendations  Skilled nursing facility    Barriers to Discharge        Equipment Recommendations  Defer to next venue    Recommendations for Other Services    Frequency Min 2X/week   Plan Discharge plan remains appropriate    Precautions / Restrictions Precautions Precautions: Fall Restrictions Other Position/Activity Restrictions: Watch O2 sats with activity   Pertinent Vitals/Pain Session conducted on 3 Liters O2    Mobility  Bed Mobility Bed Mobility: Supine to Sit;Sitting - Scoot to Edge of Bed Supine to Sit: 1: +2 Total assist Supine to Sit: Patient Percentage: 50% Sitting - Scoot to Edge of Bed: 1: +1 Total assist Sitting - Scoot to Edge of Bed: Patient Percentage: 40% Details for Bed Mobility Assistance: Required assistance moving LEs to EOB and also to lift trunk off of bed Transfers Transfers: Sit to Stand;Stand to Sit;Stand Pivot Transfers Sit to Stand: 1: +2 Total assist;With upper extremity assist;From bed Sit to Stand: Patient Percentage: 60% Stand to Sit: 1: +2 Total assist;With upper extremity assist;To chair/3-in-1;With armrests Stand to Sit: Patient Percentage: 60% Stand Pivot Transfers: 1: +1 Total assist Details for Transfer Assistance: Good initation with anterior weight shift towards feet; Required physical lift assist for sit to stand, and required knees blocked with pivot transfer Ambulation/Gait Ambulation/Gait Assistance: Not tested (comment) (Very fatigued today)    Exercises      PT Diagnosis:    PT Problem List:   PT Treatment Interventions:     PT Goals Acute Rehab PT Goals Time For Goal Achievement: 03/26/12 (Goal upfate completed today) Potential to Achieve Goals: Fair Pt will go Supine/Side to Sit: with mod assist PT Goal: Supine/Side to Sit - Progress: Progressing toward goal (above goal continues to be appropriate) Pt will Sit at [facility] of Bed: with supervision;3-5 min PT Goal: Sit at Edge Of Bed - Progress: Other (comment) (above goal continues to be appropriate) Pt will go Sit to Supine/Side: with mod assist PT Goal: Sit to Supine/Side - Progress: Other (comment) (above goal continues to be appropriate) Pt will go Sit to Stand: with mod assist PT Goal: Sit to Stand - Progress: Progressing toward goal (above goal continues to be appropriate) Pt will go Stand to Sit: with mod assist PT Goal: Stand to Sit - Progress: Progressing toward goal (above goal continues to be appropriate) Pt will Ambulate: 1 - 15 feet;with +2 total assist PT Goal: Ambulate - Progress: Other (comment) (above goal continues to be appropriate)  Visit Information  Last PT Received On: 03/12/12 Assistance Needed: +2    Subjective Data  Subjective: Minimal verbalizations; did nod "yes" when asked if she was comfortable in chair   Cognition  Arousal/Alertness: Awake/alert Orientation Level: Appears intact for tasks assessed Behavior During Session: [facility] for tasks performed Current Attention Level: Sustained Following Commands: Follows one step commands with increased time    Balance  Balance Balance Assessed: Yes Static Standing Balance Static Standing - Balance Support: Right upper extremity supported;Left upper extremity supported (Knees blocked) Static Standing - Level of Assistance:  2: Max assist;1: +1 Total assist Static Standing - Comment/# of Minutes: Static standing while RN assisted with hygeine as pt had been incontinent of stool in bed prior to getting up;  Requiring max assist for semi-stand progressing to total assist with blocking knees as pt fatigued  End of Session PT - End of Session Equipment Utilized During Treatment: Oxygen (bed pad) Activity Tolerance: Patient limited by fatigue Patient left: in chair;with call bell/phone within reach Nurse Communication: Mobility status   GP     [name] [name] Kerlan [facility] [place], [place] [phone]  03/12/2012, 3:49 PM

## 2012-03-12 NOTE — Progress Notes (Signed)
Found sitting upright in bed breathing short and slightly labored, rate 24-26 breaths /min, 02 sat @ 94 % on 2l/min [place]. Lung sounds diminished throughout. Pulmonary/ CC PA notified and made aware.

## 2012-03-12 NOTE — Progress Notes (Signed)
Speech Language Pathology Dysphagia Treatment Patient Details Name: [name] [name] MRN: [id number] DOB: 04-08-1929 Today's Date: 03/12/2012 Time: 4098-1191 SLP Time Calculation (min): 16 min  Assessment / Plan / Recommendation Clinical Impression  Pt continues to demonstrate coughing during intake - 20% of time - 2 of 10 boluses (after thin and after oatmeal)- possible aspiration.  Pt is on a conservative diet and acknowledges problems swallowing solids during this hospital stay.  Work of breathing noted to increase with pt using accessory muscle, pursed lip breathing and decreased oxygen saturation after only approx 1/4 of meal.  SLP asked pt if she needed a break to which pt obliged with delayed response.  Work of breathing prohibits pt from consuming adequate nutrition.  Advised her to consume several small meals/day.  Could pt receive snack during day to improve intake and decr work.  Also note pt dislikes [employer] - but states she likes [employer].  SLP educated pt to multiple risk factors for asp pna and SLP goal to mitigate risk but not elminiate it.  Pt verbalized understanding to information provided.   Note CXR indicated bibasilar airspace disease with collapse/consolidation of left lower lung, minimal residual left airspace dx, probable pleural effusion.   SlP placed spirometer on pt's bedside table to encourage use.     Diet Recommendation  Initiate / Change Diet: Dysphagia 1 (puree);Thin liquid    SLP Plan Continue with current plan of care   Pertinent Vitals/Pain Afebrile, rhonchi, congested.     Swallowing Goals  SLP Swallowing Goals Swallow Study Goal #2 - Progress: Progressing toward goal  General Temperature Spikes Noted: No Respiratory Status: Supplemental O2 delivered via (comment) Behavior/Cognition: Alert;Cooperative;Requires cueing;Other (comment) (delayed responses) Oral Cavity - Dentition: Edentulous Patient Positioning: Upright in bed  Oral Cavity -  Oral Hygiene   Pt was eating pureed eggs upon slp entrance to room.   Dysphagia Treatment Treatment focused on: Skilled observation of diet tolerance;Utilization of compensatory strategies Treatment Methods/Modalities: Skilled observation Patient observed directly with PO's: Yes Type of PO's observed: Dysphagia 1 (puree);Thin liquids Feeding: Able to feed self;Other (Comment) (with assistance for compensatory strategies) Liquids provided via: Straw Pharyngeal Phase Signs & Symptoms: Immediate cough;Delayed cough;Changes in respirations (cough twice during intake of approx 10 boluses) Type of cueing: Verbal Amount of cueing: Minimal   GO    Donavan Burnet, MS [facility] SLP [phone]

## 2012-03-12 NOTE — Progress Notes (Signed)
Name: Ligia Duguay MRN: [id number] DOB: Apr 23, 1929    LOS: 11  PULMONARY / CRITICAL CARE MEDICINE  Brief patient description:  78 yo admitted 7/20 with pneumonia and acute respiratory failure requiring intubation.  In the ED developed subcutaneous emphysema likely secondary to tracheal trauma during intubation.  ETT:  7/20 >>>7/24 Lines: PICC L 7/24 >>>  Cultures: 7/20  Blood  >>> Haemophilus (beta lactamase positive) 7/21  Blood >>> Haemophilus (beta lactamase positive) 7/21  Urine >>> Diphtheroids  Antibiotics: Zosyn  7/20 >>> 7/24 Vancomycin 7/20 >>> 7/24 Ceftriaxone 7/24 >>>  Events since admission: 7/20  Admitted for pneumonia / acute respiratory failure, intubate 7/21  Chest CT demonstrated subcutaneous emphysema and pneumomediastinum.  Sepsis protocol completed. 7/24  Extubated  Interval history:  More alert, coughing, neg 2.5 liters  Vital Signs: Temp:  [97.4 F (36.3 C)-98.7 F (37.1 C)] 98.1 F (36.7 C) (07/31 0900) Pulse Rate:  [64-91] 86  (07/31 0900) Resp:  [14-20] 20  (07/31 0900) BP: (126-155)/(55-82) 155/82 mmHg (07/31 0900) SpO2:  [91 %-99 %] 93 % (07/31 0900) Weight:  [59.5 kg (131 lb 2.8 oz)] 59.5 kg (131 lb 2.8 oz) (07/30 2127) 2 liters  Physical Examination: General:  No acute distress Neuro:  Cooperative, alert no focal def HEENT:  PERRL Neck:  No JVD   Cardiovascular:  RRR, no M/R/G Lungs: coarse improved, occ wheeze Abdomen:  Soft, nontender, nondistended, bowel sounds present Musculoskeletal:  Moves all extremities, weak Skin:  Less SQ crepitus c/w SQ emphysema  ASSESSMENT AND PLAN   Acute respiratory failure due to Aspiration pneumonia with H.flu, c/b Pneumomediastinum / pneumopericardium, likely secondary to traumatic intubation. P:   Bronchodilators Incentive spirometry flutter valve Continue neg balance  Sepsis, resolved.  Demand ischemia.  Dyslipidemia.  Hypertension, better control. P:  ASA  PO Metoprolol 50 PO  q12h (home dose) Norvasc to 10 Lipitor likely can move out of SDU  Hypernatremia, inproving.  Hypokalemia.  Lab 03/12/12 0620 03/11/12 0342 03/10/12 1200  K 4.2 3.2* 3.2*  NA 146* 143 143  P:   -push po fluids    Shocked liver, improving.  Low likelihood of esophageal perforation.   Lab 03/12/12 0620 03/06/12 0500  ALT 108* 677*  AST 31 97*  GGT -- --  ALKPHOS 92 115  BILITOT 0.4 0.3  P:   Repeat lft 1 week   A:  Thrombocytopenia, stable  Lab 03/08/12 0410 03/07/12 0930 03/06/12 0500  PLT 129* 113* 110*   P: dvt prevention  Aspiration pneumonia / CAP P:   Completed total 10 days planned with bacteremia after last pos BC bottle dated 7/21   Hyperglycemia.  Hypothyroidism.  Lab 03/09/12 0802 03/09/12 0317 03/08/12 2324 03/08/12 1926 03/08/12 1631  GLUCAP 80 73 120* 106* 116*   P:   Controlled on own Monitor as diet added  Acute encephalopathy secondary to resolved  Debility after prolonged critical illness P:   PT/OT  BEST PRACTICE / DISPOSITION Level of Care:SDU to floor Primary Service:  PCCM Consultants:  None Code Status:  DNR (ok to intubate) Diet:  SLP reevaluation DVT Px:  Heparin GI Px:  Not indicated Skin Integrity:  Intact Social / Family:  Not available for rounds  Patient seen and examined, agree with above note.  I dictated the care and orders written for this patient under my direction.  Koren Bound, M.D. [phone]

## 2012-03-12 NOTE — Discharge Summary (Signed)
Physician Discharge Summary     Patient ID: [name] [name] MRN: [id number] DOB/AGE: 09-23-1928 78 y.o.  Admit date: 03/01/2012 Discharge date: 03/12/2012  Admission Diagnoses: Acute respiratory failure, septic shock and pneumonia  Discharge Diagnoses:  Active Problems:  Acute respiratory failure with hypoxia  Pneumomediastinum  Encephalopathy acute  Pneumonia physical deconditioning  Dysphagia  HTN Demand cardiac ischemia Dyslipidemia  Resolved sepsis  Significant Hospital tests/ studies/ interventions and procedures  ETT: 7/20 >>>7/24  Lines: PICC L 7/24 >>> 7/31  Cultures:  7/20 Blood >>> Haemophilus (beta lactamase positive)  7/21 Blood >>> Haemophilus (beta lactamase positive)  7/21 Urine >>> Diphtheroids  Antibiotics:  Zosyn 7/20 >>> 7/24  Vancomycin 7/20 >>> 7/24  Ceftriaxone 7/24 >>>7/31   No past medical history on file.  Brief patient description: 78 yo admitted 7/20 with pneumonia and acute respiratory failure requiring intubation. In the ED developed subcutaneous emphysema likely secondary to tracheal trauma during intubation.  Hospital Course:  Acute respiratory failure due to Aspiration pneumonia with H.flu, c/b Pneumomediastinum / pneumopericardium, likely secondary to traumatic intubation. Admitted on 7/20 after being found by grandson cyanotic and minimally responsive. Was intubated by EMS and transported to ER. Dx eval was consistent w/ what appeared to be LLL felt to be CAP or aspiration, complicated by pneumomediastinum w/ pneumopericardium, felt to be due to traumatic intubation.Marland Kitchen She was placed on empiric antibiotics and transferred to the ICU for supportive care. She was maintained on the mechanical ventilator until 7/24 at which time she was successfully extubated. Post extubation focus was turned to pulmonary hygiene in the setting of her debilitated state. She is now on 2 liters N/c, but remains weak. Radiographically her CXR is improving.  She has completed antibiotics.  Recommendations:  Scheduled Bronchodilators  Incentive spirometry  flutter valve  Aspiration precautions Wean Oxygen as tolerated  Sepsis, resolved. Demand ischemia. Dyslipidemia. Hypertension, better control.  Initially presented w/ hypotension and shock due to severe sepsis. This resolved with volume resuscitation efforts. She had  nmild elevated cardiac enzymes consistent with demand ischemia. This was treated medically.  Recommendations ASA PO daily Metoprolol 50 PO q12h (home dose)  Norvasc to 10 daily  Lipitor   Hypernatremia. Hypokalemia. Hypernatremic after critical illness  Lab  03/12/12 0620  03/11/12 0342  03/10/12 1200   K  4.2  3.2*  3.2*   NA  146*  143  143   Recommendation  -push po fluids  -f/u bmp 3-4 days after d/c  Shocked liver, improving. Low likelihood of esophageal perforation.   Lab  03/12/12 0620  03/06/12 0500   ALT  108*  677*   AST  31  97*   GGT  --  --   ALKPHOS  92  115   BILITOT  0.4  0.3   Recommendation: Repeat lft 1 week   Thrombocytopenia, due to sepsis.  Lab  03/08/12 0410  03/07/12 0930  03/06/12 0500   PLT  129*  113*  110*   Recommendation  :f/u cbc 1 week  Aspiration pneumonia / CAP w/ GNR bacteremia: BC + Haemophilus (beta lactamase positive). She was treated with 10 d course of appropriate antibiotics.     Hypothyroidism.  Recommendation  Cont synthroid  Acute encephalopathy secondary to sedation and sepsis resolved   Dysphagia Recommendation Dysphagia 1 diet Needs SLP follow up in SNF.   Debility after prolonged critical illness  P:  PT/OT  Discharge Exam: BP 155/82  Pulse 86  Temp 98.1 F (36.7 C) (  Oral)  Resp 20  Ht 5\' 1"  (1.549 m)  Wt 59.5 kg (131 lb 2.8 oz)  BMI 24.79 kg/m2  SpO2 93% Physical Examination:  General: No acute distress  Neuro: Cooperative, alert no focal def  HEENT: PERRL  Neck: No JVD  Cardiovascular: RRR, no M/R/G  Lungs: coarse improved, occ  wheeze  Abdomen: Soft, nontender, nondistended, bowel sounds present  Musculoskeletal: Moves all extremities, weak  Skin:no SQ crepitus   Labs at discharge Lab Results  Component Value Date   CREATININE 0.95 03/12/2012   BUN 22 03/12/2012   NA 146* 03/12/2012   K 4.2 03/12/2012   CL 104 03/12/2012   CO2 35* 03/12/2012   Lab Results  Component Value Date   WBC 10.4 03/08/2012   HGB 11.5* 03/08/2012   HCT 34.8* 03/08/2012   MCV 93.3 03/08/2012   PLT 129* 03/08/2012   Lab Results  Component Value Date   ALT 108* 03/12/2012   AST 31 03/12/2012   ALKPHOS 92 03/12/2012   BILITOT 0.4 03/12/2012   Lab Results  Component Value Date   INR 1.40 03/01/2012    Current radiology studies Dg Chest Port 1 View  03/11/2012  *RADIOLOGY REPORT*  Clinical Data: Assess effusion.  PORTABLE CHEST - 1 VIEW  Comparison: 03/09/2012.  Findings: Trachea is midline.  Heart size stable.  Left PICC tip projects over the SVC.  There is bibasilar air space disease with collapse/consolidation in the left lower lobe. Left perihilar air space disease may have improved slightly in the interval.  Small left pleural effusion.  IMPRESSION:  1.  Bibasilar air space disease with collapse/consolidation in the left lower lobe. 2.  Mild residual left perihilar air space disease. 3.  Probable bilateral pleural effusions.  Original Report Authenticated By: Reyes Ivan, M.D.    Disposition:  Final discharge disposition not confirmed  Discharge Orders    Future Orders Please Complete By Expires   Diet - low sodium heart healthy      Scheduling Instructions:   Dysphagia 1 diet   Increase activity slowly        Medication List  As of 03/12/2012 11:22 AM   STOP taking these medications         traMADol 50 MG tablet         TAKE these medications         albuterol (5 MG/ML) 0.5% nebulizer solution   Commonly known as: PROVENTIL   Take 0.5 mLs (2.5 mg total) by nebulization every 6 (six) hours.      amLODipine 10 MG  tablet   Commonly known as: NORVASC   Take 1 tablet (10 mg total) by mouth daily.      aspirin 325 MG tablet   Take 1 tablet (325 mg total) by mouth daily.      atorvastatin 10 MG tablet   Commonly known as: LIPITOR   Take 10 mg by mouth at bedtime.      feeding supplement Pudg   Take 1 Container by mouth 3 (three) times daily between meals.      ipratropium 0.02 % nebulizer solution   Commonly known as: ATROVENT   Take 2.5 mLs (0.5 mg total) by nebulization every 6 (six) hours.      levothyroxine 175 MCG tablet   Commonly known as: SYNTHROID, LEVOTHROID   Take 175 mcg by mouth daily.      metoprolol 50 MG tablet   Commonly known as: LOPRESSOR   Take 1 tablet (  50 mg total) by mouth 2 (two) times daily.      multivitamin with minerals Tabs   Take 1 tablet by mouth daily.      Vitamin D (Ergocalciferol) 50000 UNITS Caps   Commonly known as: DRISDOL   Take 50,000 Units by mouth 2 (two) times a week.             Discharged Condition: fair, debilitated. Needs assist w/ many ADLs  Physician Statement:   The Patient was personally examined, the discharge assessment and plan has been personally reviewed and I agree with ACNP Babcock's assessment and plan. > 30 minutes of time have been dedicated to discharge assessment, planning and discharge instructions.   SignedAnders Simmonds 03/12/2012, 11:22 AM  Patient seen and examined, agree with above note.  I dictated the care and orders written for this patient under my direction.  Koren Bound, M.D. [phone]

## 2012-03-13 ENCOUNTER — Inpatient Hospital Stay (HOSPITAL_COMMUNITY): Payer: Medicare Other

## 2012-03-13 NOTE — Progress Notes (Addendum)
Pt seen and examined.  Was scheduled for d/c 7/31 but had some SOB after activities with PT and speech.  Pt comfortable this am.  No distress, denies any further episodes SOB.  She does have some mild SOB with meals and has been encouraged to eat smaller, frequent meals and would benefit from nutrition consult at SNF.  No sig change in CXR this am.    Will proceed with d/c to SNF.  No changes to previous d/c summary or d/c medications from 7/31.    Danford Bad, NP 03/13/2012  11:18 AM Pager: [phone] or [phone]  This note is a completion of the d/c summary from yesterday.  Patient seen and examined, agree with above note.  I dictated the care and orders written for this patient under my direction.  Koren Bound, M.D. [phone]

## 2012-03-13 NOTE — Progress Notes (Signed)
Speech Language Pathology Dysphagia Treatment Patient Details Name: [name] [name] MRN: [id number] DOB: [birth year] Today's Date: 03/13/2012 Time: 4098-1191 SLP Time Calculation (min): 15 min  Assessment / Plan / Recommendation Clinical Impression  Pt. seen for dysphagia treatment.  Immediate cough x 2 after 5 sips water with increased work of breathing.  Pt. given minimal cues to take smaller sips and explained how that would decrease her apneic period during swallows.  Risk for aspiration is chronic with respiratory difficulties.  Will continue to follow for possible diet texture upgrade.    Diet Recommendation  Continue with Current Diet: Thin liquid;Dysphagia 1 (puree)    SLP Plan Continue with current plan of care       Swallowing Goals  SLP Swallowing Goals Swallow Study Goal #2 - Progress: Progressing toward goal Swallow Study Goal #3 - Progress: Progressing toward goal  General Respiratory Status: Supplemental O2 delivered via (comment) Behavior/Cognition: Alert;Cooperative;Pleasant mood Patient Positioning: Upright in bed  Oral Cavity - Oral Hygiene     Dysphagia Treatment Treatment focused on: Skilled observation of diet tolerance Treatment Methods/Modalities: Skilled observation Patient observed directly with PO's: Yes Type of PO's observed: Thin liquids Feeding: Able to feed self Liquids provided via: Cup Pharyngeal Phase Signs & Symptoms: Changes in respirations;Immediate cough Type of cueing: Verbal Amount of cueing: Minimal   Royce Macadamia M.Ed [employer] [phone]  03/13/2012

## 2012-03-13 NOTE — Progress Notes (Signed)
[name], grandson, informed that pt has left the facility via EMS transport to go to [facility] at [place]. [place], Rosanna Randy

## 2012-03-13 NOTE — Progress Notes (Signed)
Report called to [place], LPN at [facility]. Pt remains stable. Tele Dc'd. IV team to DC PICC line. Foley DC'd. Nurse notified to watch for pt to void. Pt prepared for DC. EMS to transport pt out of facility. [place], Rosanna Randy

## 2012-03-13 NOTE — Clinical Social Work Note (Signed)
Patient discharged today to [facility] for short-term rehab. CSW facilitated transport via ambulance. Discharge information forwarded to facility. Son [name] [phone] contacted regarding discharge.  Genelle Bal, MSW, LCSW [phone]

## 2012-03-18 LAB — CULTURE, BLOOD (ROUTINE X 2)

## 2012-10-28 ENCOUNTER — Non-Acute Institutional Stay (SKILLED_NURSING_FACILITY): Payer: Medicare Other | Admitting: Adult Health

## 2012-10-28 DIAGNOSIS — E559 Vitamin D deficiency, unspecified: Secondary | ICD-10-CM

## 2012-10-28 DIAGNOSIS — J9601 Acute respiratory failure with hypoxia: Secondary | ICD-10-CM

## 2012-10-28 DIAGNOSIS — E785 Hyperlipidemia, unspecified: Secondary | ICD-10-CM

## 2012-10-28 DIAGNOSIS — I10 Essential (primary) hypertension: Secondary | ICD-10-CM

## 2012-10-28 DIAGNOSIS — J9600 Acute respiratory failure, unspecified whether with hypoxia or hypercapnia: Secondary | ICD-10-CM

## 2012-10-28 DIAGNOSIS — E039 Hypothyroidism, unspecified: Secondary | ICD-10-CM

## 2012-11-05 ENCOUNTER — Encounter: Payer: Self-pay | Admitting: Adult Health

## 2012-11-05 DIAGNOSIS — E559 Vitamin D deficiency, unspecified: Secondary | ICD-10-CM | POA: Insufficient documentation

## 2012-11-05 DIAGNOSIS — E039 Hypothyroidism, unspecified: Secondary | ICD-10-CM | POA: Insufficient documentation

## 2012-11-05 DIAGNOSIS — E785 Hyperlipidemia, unspecified: Secondary | ICD-10-CM | POA: Insufficient documentation

## 2012-11-05 DIAGNOSIS — I10 Essential (primary) hypertension: Secondary | ICD-10-CM | POA: Insufficient documentation

## 2012-11-05 NOTE — Assessment & Plan Note (Signed)
Is presently stable is on albuterol and atrovent neb treatments every 6 hours

## 2012-11-05 NOTE — Assessment & Plan Note (Signed)
Is taking vitamin d 50,000 units twice daily

## 2012-11-05 NOTE — Assessment & Plan Note (Signed)
Is stable is taking synthroid 150 mcg daily

## 2012-11-05 NOTE — Progress Notes (Signed)
Patient ID: [name] [name], female   DOB: 16-Apr-1929, 79 y.o.   MRN: [id number]  Chief Complaint  Patient presents with  . Medical Managment of Chronic Issues    HPI:  Dyslipidemia Is stable is taking lipitor 10 mg daily   Essential hypertension, benign Is stable is taking lopressor 50 mg twice daily takes norvasc 10 mg daily takes asa 325 mg daily  Acute respiratory failure with hypoxia Is presently stable is on albuterol and atrovent neb treatments every 6 hours   Unspecified hypothyroidism Is stable is taking synthroid 150 mcg daily   Unspecified vitamin D deficiency Is taking vitamin d 50,000 units twice daily    Past Medical History  Diagnosis Date  . Hypertension   . Hyperlipidemia   . Thyroid disease   . Hypokalemia   . Vitamin D deficiency    No past surgical history on file.   VITAL SIGNS BP 138/61  Pulse 84  Wt 120 lb (54.432 kg)  BMI 22.69 kg/m2   Patient's Medications  New Prescriptions   No medications on file  Previous Medications   ALBUTEROL (PROVENTIL) (5 MG/ML) 0.5% NEBULIZER SOLUTION    Take 0.5 mLs (2.5 mg total) by nebulization every 6 (six) hours.   AMLODIPINE (NORVASC) 10 MG TABLET    Take 1 tablet (10 mg total) by mouth daily.   ASPIRIN 325 MG TABLET    Take 1 tablet (325 mg total) by mouth daily.   ATORVASTATIN (LIPITOR) 10 MG TABLET    Take 10 mg by mouth at bedtime.   FEEDING SUPPLEMENT (ENSURE) PUDG    Take 1 Container by mouth 3 (three) times daily between meals.   IPRATROPIUM (ATROVENT) 0.02 % NEBULIZER SOLUTION    Take 2.5 mLs (0.5 mg total) by nebulization every 6 (six) hours.   LEVOTHYROXINE (SYNTHROID, LEVOTHROID) 175 MCG TABLET    Take 150 mcg by mouth daily.    METOPROLOL (LOPRESSOR) 50 MG TABLET    Take 1 tablet (50 mg total) by mouth 2 (two) times daily.   MULTIPLE VITAMIN (MULTIVITAMIN WITH MINERALS) TABS    Take 1 tablet by mouth daily.   VITAMIN D, ERGOCALCIFEROL, (DRISDOL) 50000 UNITS CAPS    Take 50,000 Units by  mouth 2 (two) times a week.  Modified Medications   No medications on file  Discontinued Medications   No medications on file    SIGNIFICANT DIAGNOSTIC EXAMS   None recent     Component Value Date/Time   ALBUMIN 2.7* 03/12/2012 0620   AST 31 03/12/2012 0620   ALT 108* 03/12/2012 0620   ALKPHOS 92 03/12/2012 0620   BILITOT 0.4 03/12/2012 0620       Component Value Date/Time   BUN 22 03/12/2012 0620   GLUCOSE 116* 03/12/2012 0620   CREATININE 0.95 03/12/2012 0620   K 4.2 03/12/2012 0620   NA 146* 03/12/2012 0620       Component Value Date/Time   WBC 10.4 03/08/2012 0410   RBC 3.73* 03/08/2012 0410   HGB 11.5* 03/08/2012 0410   HCT 34.8* 03/08/2012 0410   PLT 129* 03/08/2012 0410   MCV 93.3 03/08/2012 0410   10-08-12: tsh 0.109   Review of Systems  Constitutional: Negative for malaise/fatigue.  Respiratory: Negative for cough and shortness of breath.   Cardiovascular: Negative for chest pain, palpitations and leg swelling.  Gastrointestinal: Negative for heartburn, abdominal pain and constipation.  Musculoskeletal: Negative for myalgias, back pain and joint pain.  Skin: Negative.   Psychiatric/Behavioral: Negative for  depression. The patient does not have insomnia.    Physical Exam  Constitutional: She is oriented to person, place, and time. She appears well-developed and well-nourished.  Neck: Neck supple.  Cardiovascular: Normal rate, regular rhythm and intact distal pulses.   Respiratory: Effort normal and breath sounds normal.  GI: Soft. Bowel sounds are normal.  Musculoskeletal: Normal range of motion. She exhibits no edema.  Neurological: She is alert and oriented to person, place, and time.  Skin: Skin is warm and dry.  Psychiatric: She has a normal mood and affect.    ASSESSMENT/ PLAN: Patient's condition is stable; no changes in medications are recommended. We will continue to monitor and make changes as necessary.    FUTURE ORDERS: None at this times.

## 2012-11-05 NOTE — Assessment & Plan Note (Signed)
Is stable is taking lipitor 10 mg daily  

## 2012-11-05 NOTE — Assessment & Plan Note (Addendum)
Is stable is taking lopressor 50 mg twice daily takes norvasc 10 mg daily takes asa 325 mg daily

## 2012-12-23 ENCOUNTER — Non-Acute Institutional Stay (SKILLED_NURSING_FACILITY): Payer: Medicare Other | Admitting: Adult Health

## 2012-12-23 DIAGNOSIS — I10 Essential (primary) hypertension: Secondary | ICD-10-CM

## 2012-12-23 DIAGNOSIS — E876 Hypokalemia: Secondary | ICD-10-CM

## 2012-12-23 DIAGNOSIS — J9610 Chronic respiratory failure, unspecified whether with hypoxia or hypercapnia: Secondary | ICD-10-CM

## 2012-12-23 DIAGNOSIS — E785 Hyperlipidemia, unspecified: Secondary | ICD-10-CM

## 2012-12-23 DIAGNOSIS — E039 Hypothyroidism, unspecified: Secondary | ICD-10-CM

## 2012-12-23 DIAGNOSIS — E559 Vitamin D deficiency, unspecified: Secondary | ICD-10-CM

## 2013-01-02 ENCOUNTER — Non-Acute Institutional Stay (SKILLED_NURSING_FACILITY): Payer: Medicare Other | Admitting: Nurse Practitioner

## 2013-01-02 DIAGNOSIS — M25569 Pain in unspecified knee: Secondary | ICD-10-CM | POA: Insufficient documentation

## 2013-01-02 DIAGNOSIS — M25562 Pain in left knee: Secondary | ICD-10-CM

## 2013-01-02 NOTE — Assessment & Plan Note (Signed)
Will have staff apply ice TID and Biofreeze TID for 7 days then as needed for pain

## 2013-01-02 NOTE — Progress Notes (Signed)
Patient ID: [name] [name], female   DOB: 09/10/1928, 79 y.o.   MRN: [id number]  Nursing Home Location:  [facility]   Place of Service: SNF [phone])   Chief Complaint: AV: left knee pain  HPI:  79 year old female with PMH of hypothyroid, dyslipidemia, HTN, and vit D deficiency has been complaining of increased knee pain and swelling for the past 3-4 days. Xray was done which was negative for joint effusion or fracture. Pt reports pain has been on and off for years but it has been getting worse. No injury noted. Pt is still able to bear weight and has full ROM. No redness or heat to joint. No fevers or chills.   Review of Systems:  Review of Systems  Constitutional: Negative for fever, chills and malaise/fatigue.  Cardiovascular: Negative for chest pain and leg swelling.  Musculoskeletal: Positive for myalgias and joint pain (left knee). Negative for falls.  Neurological: Negative for weakness.     Medications: Patient's Medications  New Prescriptions   No medications on file  Previous Medications   ALBUTEROL (PROVENTIL) (5 MG/ML) 0.5% NEBULIZER SOLUTION    Take 0.5 mLs (2.5 mg total) by nebulization every 6 (six) hours.   AMLODIPINE (NORVASC) 10 MG TABLET    Take 1 tablet (10 mg total) by mouth daily.   ASPIRIN 325 MG TABLET    Take 1 tablet (325 mg total) by mouth daily.   ATORVASTATIN (LIPITOR) 10 MG TABLET    Take 10 mg by mouth at bedtime.   FEEDING SUPPLEMENT (ENSURE) PUDG    Take 1 Container by mouth 3 (three) times daily between meals.   IPRATROPIUM (ATROVENT) 0.02 % NEBULIZER SOLUTION    Take 2.5 mLs (0.5 mg total) by nebulization every 6 (six) hours.   LEVOTHYROXINE (SYNTHROID, LEVOTHROID) 175 MCG TABLET    Take 150 mcg by mouth daily.    METOPROLOL (LOPRESSOR) 50 MG TABLET    Take 1 tablet (50 mg total) by mouth 2 (two) times daily.   MULTIPLE VITAMIN (MULTIVITAMIN WITH MINERALS) TABS    Take 1 tablet by mouth daily.   VITAMIN D, ERGOCALCIFEROL,  (DRISDOL) 50000 UNITS CAPS    Take 50,000 Units by mouth 2 (two) times a week.  Modified Medications   No medications on file  Discontinued Medications   No medications on file     Physical Exam:  Filed Vitals:   01/02/13 1250  BP: 120/60  Pulse: 70  Temp: 98.4 F (36.9 C)  Resp: 20  SpO2: 98%    Physical Exam  Constitutional: She appears well-developed. No distress.  Cardiovascular: Normal rate and regular rhythm.   Pulmonary/Chest: Effort normal and breath sounds normal. No respiratory distress.  Musculoskeletal: Normal range of motion. She exhibits edema (mild edema to medial aspect of left knee) and tenderness.       Left knee: She exhibits no effusion, no ecchymosis, no deformity, no laceration and no erythema.  Skin: She is not diaphoretic.       Significant Diagnostic Results: Left knee: three views done 01/01/13 shows moderate diffuse osteopenia  No acute fracture No knee join effusion Degenerative calcification seen at the knee joint involving menisci    Assessment/Plan Knee pain Will have staff apply ice TID and Biofreeze TID for 7 days then as needed for pain

## 2013-06-18 NOTE — Progress Notes (Signed)
Patient ID: [name] [name], female   DOB: 11-01-1928, 79 y.o.   MRN: [id number]  [facility]  No Known Allergies  Chief Complaint  Patient presents with  . Medical Managment of Chronic Issues    HPI  She is being seen for the management of her chronic illnesses. Overall her status remains without recent change. The nursing is not voicing any concerns at this time. She is not any complaints of concerns and states that she is doing well.   Past Medical History  Diagnosis Date  . Hypertension   . Hyperlipidemia   . Thyroid disease   . Hypokalemia   . Vitamin D deficiency     No past surgical history on file.  Filed Vitals:   12/23/12 1315  BP: 125/66  Pulse: 70  Height: 5\' 1"  (1.549 m)  Weight: 123 lb (55.792 kg)    MEDICATIONS  Albuterol nebs every 6 hours norvasc 10 mg daily lipitor 10 mg daily Vit d 50,000 units 2 times weekly atrovent neb every 6 hours Lopressor 50 mg twice daily K+ 20 meq daily  Synthroid 100 mcg daily   LABS REVIEWED  10-08-12: tsh 0.109     Review of Systems  Constitutional: Negative for malaise/fatigue.  Respiratory: Negative for cough and shortness of breath.   Cardiovascular: Negative for chest pain, palpitations and leg swelling.  Gastrointestinal: Negative for heartburn, abdominal pain and constipation.  Musculoskeletal: Negative for myalgias, back pain and joint pain.  Skin: Negative.   Psychiatric/Behavioral: Negative for depression. The patient does not have insomnia.    Physical Exam  Constitutional: She is oriented to person, place, and time. She appears well-developed and well-nourished.  Neck: Neck supple.  Cardiovascular: Normal rate, regular rhythm and intact distal pulses.   Respiratory: Effort normal and breath sounds normal.  GI: Soft. Bowel sounds are normal.  Musculoskeletal: Normal range of motion. She exhibits no edema.  Neurological: She is alert and oriented to person, place, and time.  Skin: Skin is  warm and dry.  Psychiatric: She has a normal mood and affect.     ASSESSMENT/PLAN  1. Chronic respiratory failure: will continue albuterol/atrovent neb treatment every 6 hours and will monitor her status.   2. Hypertension: is stable will continue lopressor 50 mg twice daily and norvasc 10 mg daily  3. Hypothyroidism will continue synthroid 100 mcg daily and will check a tsh  4. Vit d deficiency; will continue vit d 50,000 units twice weekly and will check a vit d level  5. Dyslipidemia: will continue lipitor 10 mg daily  6. Hypokalemia: will continue k+ 20 meq daily

## 2013-08-07 ENCOUNTER — Encounter (HOSPITAL_COMMUNITY): Payer: Self-pay | Admitting: Emergency Medicine

## 2013-08-07 ENCOUNTER — Emergency Department (HOSPITAL_COMMUNITY)
Admission: EM | Admit: 2013-08-07 | Discharge: 2013-08-07 | Disposition: A | Discharge status: Home / Self Care | Payer: Medicare Other | Attending: Emergency Medicine | Admitting: Emergency Medicine

## 2013-08-07 ENCOUNTER — Emergency Department (HOSPITAL_COMMUNITY): Payer: Medicare Other

## 2013-08-07 DIAGNOSIS — F29 Unspecified psychosis not due to a substance or known physiological condition: Secondary | ICD-10-CM | POA: Insufficient documentation

## 2013-08-07 DIAGNOSIS — E079 Disorder of thyroid, unspecified: Secondary | ICD-10-CM | POA: Insufficient documentation

## 2013-08-07 DIAGNOSIS — R0609 Other forms of dyspnea: Secondary | ICD-10-CM | POA: Insufficient documentation

## 2013-08-07 DIAGNOSIS — F0390 Unspecified dementia without behavioral disturbance: Secondary | ICD-10-CM | POA: Insufficient documentation

## 2013-08-07 DIAGNOSIS — Z79899 Other long term (current) drug therapy: Secondary | ICD-10-CM | POA: Insufficient documentation

## 2013-08-07 DIAGNOSIS — R0989 Other specified symptoms and signs involving the circulatory and respiratory systems: Secondary | ICD-10-CM | POA: Insufficient documentation

## 2013-08-07 DIAGNOSIS — I10 Essential (primary) hypertension: Secondary | ICD-10-CM | POA: Insufficient documentation

## 2013-08-07 DIAGNOSIS — Z9981 Dependence on supplemental oxygen: Secondary | ICD-10-CM | POA: Insufficient documentation

## 2013-08-07 DIAGNOSIS — Z87891 Personal history of nicotine dependence: Secondary | ICD-10-CM | POA: Insufficient documentation

## 2013-08-07 DIAGNOSIS — E785 Hyperlipidemia, unspecified: Secondary | ICD-10-CM | POA: Insufficient documentation

## 2013-08-07 DIAGNOSIS — J90 Pleural effusion, not elsewhere classified: Secondary | ICD-10-CM | POA: Insufficient documentation

## 2013-08-07 DIAGNOSIS — R0600 Dyspnea, unspecified: Secondary | ICD-10-CM

## 2013-08-07 LAB — URINE MICROSCOPIC-ADD ON

## 2013-08-07 LAB — URINALYSIS, ROUTINE W REFLEX MICROSCOPIC
Glucose, UA: NEGATIVE mg/dL
Ketones, ur: 15 mg/dL — AB
Nitrite: NEGATIVE
Protein, ur: 30 mg/dL — AB
Specific Gravity, Urine: 1.024 (ref 1.005–1.030)

## 2013-08-07 LAB — BASIC METABOLIC PANEL
BUN: 28 mg/dL — ABNORMAL HIGH (ref 6–23)
CO2: 26 mEq/L (ref 19–32)
Chloride: 99 mEq/L (ref 96–112)
Creatinine, Ser: 1.11 mg/dL — ABNORMAL HIGH (ref 0.50–1.10)
GFR calc non Af Amer: 45 mL/min — ABNORMAL LOW (ref >90)
Glucose, Bld: 142 mg/dL — ABNORMAL HIGH (ref 70–99)
Potassium: 3.1 mEq/L — ABNORMAL LOW (ref 3.5–5.1)

## 2013-08-07 LAB — CBC
HCT: 46.1 % — ABNORMAL HIGH (ref 36.0–46.0)
Hemoglobin: 15 g/dL (ref 12.0–15.0)
MCH: 30.1 pg (ref 26.0–34.0)
MCHC: 32.5 g/dL (ref 30.0–36.0)
RDW: 14.8 % (ref 11.5–15.5)

## 2013-08-07 LAB — PRO B NATRIURETIC PEPTIDE: Pro B Natriuretic peptide (BNP): 1574 pg/mL — ABNORMAL HIGH (ref 0–450)

## 2013-08-07 LAB — POCT I-STAT TROPONIN I: Troponin i, poc: 0.14 ng/mL (ref 0.00–0.08)

## 2013-08-07 MED ORDER — POTASSIUM CHLORIDE CRYS ER 20 MEQ PO TBCR: 40.0000 meq | EXTENDED_RELEASE_TABLET | Freq: Once | ORAL | Status: DC

## 2013-08-07 MED ORDER — ALBUTEROL SULFATE (5 MG/ML) 0.5% IN NEBU
5.0000 mg | INHALATION_SOLUTION | Freq: Once | RESPIRATORY_TRACT | Status: AC
  Administered 2013-08-07: 5 mg via RESPIRATORY_TRACT
  Filled 2013-08-07: qty 1

## 2013-08-07 MED ORDER — IPRATROPIUM BROMIDE 0.02 % IN SOLN
0.5000 mg | Freq: Once | RESPIRATORY_TRACT | Status: DC
  Filled 2013-08-07: qty 2.5

## 2013-08-07 MED ORDER — FUROSEMIDE 20 MG PO TABS: 20.0000 mg | ORAL_TABLET | Freq: Once | ORAL | Status: DC

## 2013-08-07 MED ORDER — IPRATROPIUM BROMIDE 0.02 % IN SOLN
0.5000 mg | Freq: Once | RESPIRATORY_TRACT | Status: AC
  Administered 2013-08-07: 0.5 mg via RESPIRATORY_TRACT
  Filled 2013-08-07: qty 2.5

## 2013-08-07 MED ORDER — FUROSEMIDE 10 MG/ML IJ SOLN
INTRAMUSCULAR | Status: AC
  Filled 2013-08-07: qty 2

## 2013-08-07 MED ORDER — FUROSEMIDE 10 MG/ML IJ SOLN
20.0000 mg | Freq: Once | INTRAMUSCULAR | Status: AC
  Administered 2013-08-07: 20 mg via INTRAVENOUS

## 2013-08-07 NOTE — ED Notes (Signed)
MD at bedside. (Dr. Jacubowitz) 

## 2013-08-07 NOTE — ED Notes (Signed)
Respiratory at bedside.

## 2013-08-07 NOTE — ED Notes (Signed)
Dr. Ethelda Chick with older copy

## 2013-08-07 NOTE — ED Notes (Addendum)
Pt arrrives [facility] from [place] for respiratory distress. Pt with known Left Pleural Effusion. Productive cough.   2.5 mg Albuterol Nebulizer given by EMS PTA.

## 2013-08-07 NOTE — ED Notes (Signed)
istat troponin reported to [place], Charity fundraiser

## 2013-08-07 NOTE — ED Notes (Signed)
Patient transported to X-ray 

## 2013-08-07 NOTE — ED Provider Notes (Signed)
CSN: [id number]     Arrival date & time 08/07/13  6295 History   First MD Initiated Contact with Patient 08/07/13 [phone]     Chief Complaint  Patient presents with  . Respiratory Distress   (Consider location/radiation/quality/duration/timing/severity/associated sxs/prior Treatment) HPI Level V caveat dementia history is obtained from Ms. Briant Sites, nurse at the [facility] skilled nursing facility. Patient reportedly had pulse oximetry of 89% yesterday and complained of dyspnea. Pulse oximetry increased to 92% on oxygen 2 L nasal cannula. Temperature was 103.2 last night She had a chest x-ray performed at the skilled nursing facility yesterday which showed a pleural effusion. She was treated with Ceftin. She also reportedly fell on the 3-11 shift yesterday. Patient denies pain anywhere presently. Ms. Melvyn Neth reports patient more confused since yesterday though she does suffer from baseline dementia. Past Medical History  Diagnosis Date  . Hypertension   . Hyperlipidemia   . Thyroid disease   . Hypokalemia   . Vitamin D deficiency    History reviewed. No pertinent past surgical history. No family history on file. History  Substance Use Topics  . Smoking status: Former Smoker    Types: Cigarettes  . Smokeless tobacco: Not on file  . Alcohol Use: No   OB History   Grav Para Term Preterm Abortions TAB SAB Ect Mult Living                 Review of Systems  Unable to perform ROS: Dementia  Respiratory: Positive for shortness of breath.     Allergies  Review of patient's allergies indicates no known allergies.  Home Medications   Current Outpatient Rx  Name  Route  Sig  Dispense  Refill  . atorvastatin (LIPITOR) 10 MG tablet   Oral   Take 10 mg by mouth at bedtime.         . feeding supplement (ENSURE) PUDG   Oral   Take 1 Container by mouth 3 (three) times daily between meals.         Marland Kitchen levothyroxine (SYNTHROID, LEVOTHROID) 175 MCG tablet   Oral   Take 150 mcg by  mouth daily.          . metoprolol (LOPRESSOR) 50 MG tablet   Oral   Take 1 tablet (50 mg total) by mouth 2 (two) times daily.         . Multiple Vitamin (MULTIVITAMIN WITH MINERALS) TABS   Oral   Take 1 tablet by mouth daily.         . Vitamin D, Ergocalciferol, (DRISDOL) 50000 UNITS CAPS   Oral   Take 50,000 Units by mouth 2 (two) times a week.          BP 119/59  Pulse 126  Temp(Src) 99.5 F (37.5 C) (Rectal)  Resp 25  SpO2 95% Physical Exam  Nursing note and vitals reviewed. Constitutional: She appears well-developed.  Chronically ill-appearing mild respiratory distress  HENT:  Head: Normocephalic and atraumatic.  Eyes: Conjunctivae are normal. Pupils are equal, round, and reactive to light.  Neck: Neck supple. No tracheal deviation present. No thyromegaly present.  Cardiovascular: Normal rate and regular rhythm.   No murmur heard. Pulmonary/Chest:  Diffuse Rales. Mild retractions.  Abdominal: Soft. Bowel sounds are normal. She exhibits no distension. There is no tenderness.  Musculoskeletal: Normal range of motion. She exhibits no edema and no tenderness.  Paraspinal nontender. All 4 extremities without redness swelling or tenderness neurovascular intact to  Neurological: She is alert. Coordination normal.  Skin: Skin is warm and dry. No rash noted.  Psychiatric: She has a normal mood and affect.    ED Course  Procedures (including critical care time) Labs Review Labs Reviewed  BASIC METABOLIC PANEL - Abnormal; Notable for the following:    Potassium 3.1 (*)    Glucose, Bld 142 (*)    BUN 28 (*)    Creatinine, Ser 1.11 (*)    GFR calc non Af Amer 45 (*)    GFR calc Af Amer 52 (*)    All other components within normal limits  CBC - Abnormal; Notable for the following:    HCT 46.1 (*)    Platelets 146 (*)    All other components within normal limits  POCT I-STAT TROPONIN I - Abnormal; Notable for the following:    Troponin i, poc 0.14 (*)    All  other components within normal limits   Imaging Review Dg Chest 2 View (if Patient Has Fever And/or Copd)  08/07/2013   CLINICAL DATA:  Respiratory distress  EXAM: CHEST  2 VIEW  COMPARISON:  March 13, 2012  FINDINGS: There is underlying emphysema. There is no edema or consolidation. Heart is mildly enlarged. There is prominence of the main pulmonary arteries, particularly on the left, with rapid peripheral tapering consistent with pulmonary arterial hypertension.  There is calcification in the right coronary artery.  There appears to be more prominence in the left hilum compared to the previous study. This finding could indicate progression of pulmonary arterial hypertension but also could potentially indicate superimposed left hilar adenopathy.  There are no bone lesions.  IMPRESSION: Emphysema with cardiomegaly and apparent pulmonary arterial hypertension. There has been an increase in soft tissue fullness left hilum compared to the prior study. This finding could be due to pulmonary arterial hypertension but also could indicate adenopathy in the left hilar region. Given this circumstance, correlation with chest CT, ideally with intravenous contrast, would be advised to further assess.  No edema or consolidation. There is right coronary artery calcification.   Electronically Signed   By: Bretta Bang M.D.   On: 08/07/2013 09:43    EKG Interpretation    Date/Time:  Friday August 07 2013 09:01:35 EST Ventricular Rate:  115 PR Interval:  156 QRS Duration: 143 QT Interval:  335 QTC Calculation: 463 R Axis:   66 Text Interpretation:  Sinus tachycardia Right bundle branch block Confirmed by Ethelda Chick  MD, Neasia Fleeman ([id number]) on 08/07/2013 10:51:11 AM           Results for orders placed during the hospital encounter of 08/07/13  BASIC METABOLIC PANEL      Result Value Range   Sodium 141  135 - 145 mEq/L   Potassium 3.1 (*) 3.5 - 5.1 mEq/L   Chloride 99  96 - 112 mEq/L   CO2 26  19 - 32  mEq/L   Glucose, Bld 142 (*) 70 - 99 mg/dL   BUN 28 (*) 6 - 23 mg/dL   Creatinine, Ser 0.98 (*) 0.50 - 1.10 mg/dL   Calcium 8.7  8.4 - 11.9 mg/dL   GFR calc non Af Amer 45 (*) >90 mL/min   GFR calc Af Amer 52 (*) >90 mL/min  CBC      Result Value Range   WBC 9.1  4.0 - 10.5 K/uL   RBC 4.99  3.87 - 5.11 MIL/uL   Hemoglobin 15.0  12.0 - 15.0 g/dL   HCT 14.7 (*) 82.9 - 56.2 %  MCV 92.4  78.0 - 100.0 fL   MCH 30.1  26.0 - 34.0 pg   MCHC 32.5  30.0 - 36.0 g/dL   RDW 40.9  81.1 - 91.4 %   Platelets 146 (*) 150 - 400 K/uL  URINALYSIS, ROUTINE W REFLEX MICROSCOPIC      Result Value Range   Color, Urine YELLOW  YELLOW   APPearance CLOUDY (*) CLEAR   Specific Gravity, Urine 1.024  1.005 - 1.030   pH 5.0  5.0 - 8.0   Glucose, UA NEGATIVE  NEGATIVE mg/dL   Hgb urine dipstick SMALL (*) NEGATIVE   Bilirubin Urine SMALL (*) NEGATIVE   Ketones, ur 15 (*) NEGATIVE mg/dL   Protein, ur 30 (*) NEGATIVE mg/dL   Urobilinogen, UA 1.0  0.0 - 1.0 mg/dL   Nitrite NEGATIVE  NEGATIVE   Leukocytes, UA NEGATIVE  NEGATIVE  PRO B NATRIURETIC PEPTIDE      Result Value Range   Pro B Natriuretic peptide (BNP) 1574.0 (*) 0 - 450 pg/mL  URINE MICROSCOPIC-ADD ON      Result Value Range   RBC / HPF 0-2  <3 RBC/hpf   Bacteria, UA RARE  RARE   Casts GRANULAR CAST (*) NEGATIVE  POCT I-STAT TROPONIN I      Result Value Range   Troponin i, poc 0.14 (*) 0.00 - 0.08 ng/mL   Comment NOTIFIED PHYSICIAN     Comment 3            Dg Chest 2 View (if Patient Has Fever And/or Copd)  08/07/2013   CLINICAL DATA:  Respiratory distress  EXAM: CHEST  2 VIEW  COMPARISON:  March 13, 2012  FINDINGS: There is underlying emphysema. There is no edema or consolidation. Heart is mildly enlarged. There is prominence of the main pulmonary arteries, particularly on the left, with rapid peripheral tapering consistent with pulmonary arterial hypertension.  There is calcification in the right coronary artery.  There appears to be more  prominence in the left hilum compared to the previous study. This finding could indicate progression of pulmonary arterial hypertension but also could potentially indicate superimposed left hilar adenopathy.  There are no bone lesions.  IMPRESSION: Emphysema with cardiomegaly and apparent pulmonary arterial hypertension. There has been an increase in soft tissue fullness left hilum compared to the prior study. This finding could be due to pulmonary arterial hypertension but also could indicate adenopathy in the left hilar region. Given this circumstance, correlation with chest CT, ideally with intravenous contrast, would be advised to further assess.  No edema or consolidation. There is right coronary artery calcification.   Electronically Signed   By: Bretta Bang M.D.   On: 08/07/2013 09:43    10:45 AM patient states breathing is "slow" after treatment with nebulizer Results for orders placed during the hospital encounter of 08/07/13  BASIC METABOLIC PANEL      Result Value Range   Sodium 141  135 - 145 mEq/L   Potassium 3.1 (*) 3.5 - 5.1 mEq/L   Chloride 99  96 - 112 mEq/L   CO2 26  19 - 32 mEq/L   Glucose, Bld 142 (*) 70 - 99 mg/dL   BUN 28 (*) 6 - 23 mg/dL   Creatinine, Ser 7.82 (*) 0.50 - 1.10 mg/dL   Calcium 8.7  8.4 - 95.6 mg/dL   GFR calc non Af Amer 45 (*) >90 mL/min   GFR calc Af Amer 52 (*) >90 mL/min  CBC  Result Value Range   WBC 9.1  4.0 - 10.5 K/uL   RBC 4.99  3.87 - 5.11 MIL/uL   Hemoglobin 15.0  12.0 - 15.0 g/dL   HCT 16.1 (*) 09.6 - 04.5 %   MCV 92.4  78.0 - 100.0 fL   MCH 30.1  26.0 - 34.0 pg   MCHC 32.5  30.0 - 36.0 g/dL   RDW 40.9  81.1 - 91.4 %   Platelets 146 (*) 150 - 400 K/uL  URINALYSIS, ROUTINE W REFLEX MICROSCOPIC      Result Value Range   Color, Urine YELLOW  YELLOW   APPearance CLOUDY (*) CLEAR   Specific Gravity, Urine 1.024  1.005 - 1.030   pH 5.0  5.0 - 8.0   Glucose, UA NEGATIVE  NEGATIVE mg/dL   Hgb urine dipstick SMALL (*) NEGATIVE    Bilirubin Urine SMALL (*) NEGATIVE   Ketones, ur 15 (*) NEGATIVE mg/dL   Protein, ur 30 (*) NEGATIVE mg/dL   Urobilinogen, UA 1.0  0.0 - 1.0 mg/dL   Nitrite NEGATIVE  NEGATIVE   Leukocytes, UA NEGATIVE  NEGATIVE  PRO B NATRIURETIC PEPTIDE      Result Value Range   Pro B Natriuretic peptide (BNP) 1574.0 (*) 0 - 450 pg/mL  URINE MICROSCOPIC-ADD ON      Result Value Range   RBC / HPF 0-2  <3 RBC/hpf   Bacteria, UA RARE  RARE   Casts GRANULAR CAST (*) NEGATIVE  POCT I-STAT TROPONIN I      Result Value Range   Troponin i, poc 0.14 (*) 0.00 - 0.08 ng/mL   Comment NOTIFIED PHYSICIAN     Comment 3            Dg Chest 2 View (if Patient Has Fever And/or Copd)  08/07/2013   CLINICAL DATA:  Respiratory distress  EXAM: CHEST  2 VIEW  COMPARISON:  March 13, 2012  FINDINGS: There is underlying emphysema. There is no edema or consolidation. Heart is mildly enlarged. There is prominence of the main pulmonary arteries, particularly on the left, with rapid peripheral tapering consistent with pulmonary arterial hypertension.  There is calcification in the right coronary artery.  There appears to be more prominence in the left hilum compared to the previous study. This finding could indicate progression of pulmonary arterial hypertension but also could potentially indicate superimposed left hilar adenopathy.  There are no bone lesions.  IMPRESSION: Emphysema with cardiomegaly and apparent pulmonary arterial hypertension. There has been an increase in soft tissue fullness left hilum compared to the prior study. This finding could be due to pulmonary arterial hypertension but also could indicate adenopathy in the left hilar region. Given this circumstance, correlation with chest CT, ideally with intravenous contrast, would be advised to further assess.  No edema or consolidation. There is right coronary artery calcification.   Electronically Signed   By: Bretta Bang M.D.   On: 08/07/2013 09:43    MDM  No  diagnosis found.  In light of patient's elevated BNP, congestive heart failure is likely contributing to patient's dyspnea. Spoke with Dr. Waynard Edwards. Start start Lasix. Oxygen, continue Ceftin. I've also spoken with patient's power of attorney Francina Ames. I've explained to Mr. Cornelius Moras that patient's short-term prognosis is poor Plan is to send patient back to nursing home where she is more familiar with her surroundings, has lived there for years.  Diagnosis #1 dyspnea #2 hypokalemia #3 hyperglycemia   Doug Sou, MD 08/07/13 1346

## 2014-01-28 ENCOUNTER — Encounter (HOSPITAL_COMMUNITY): Payer: Self-pay | Admitting: Emergency Medicine

## 2014-01-28 ENCOUNTER — Emergency Department (HOSPITAL_COMMUNITY)
Admission: EM | Admit: 2014-01-28 | Discharge: 2014-01-28 | Disposition: A | Discharge status: Home / Self Care | Payer: Medicare Other | Attending: Emergency Medicine | Admitting: Emergency Medicine

## 2014-01-28 ENCOUNTER — Emergency Department (HOSPITAL_COMMUNITY): Payer: Medicare Other

## 2014-01-28 DIAGNOSIS — E785 Hyperlipidemia, unspecified: Secondary | ICD-10-CM | POA: Insufficient documentation

## 2014-01-28 DIAGNOSIS — Z87891 Personal history of nicotine dependence: Secondary | ICD-10-CM | POA: Diagnosis not present

## 2014-01-28 DIAGNOSIS — Z79899 Other long term (current) drug therapy: Secondary | ICD-10-CM | POA: Insufficient documentation

## 2014-01-28 DIAGNOSIS — R0602 Shortness of breath: Secondary | ICD-10-CM | POA: Diagnosis present

## 2014-01-28 DIAGNOSIS — E559 Vitamin D deficiency, unspecified: Secondary | ICD-10-CM | POA: Diagnosis not present

## 2014-01-28 DIAGNOSIS — J441 Chronic obstructive pulmonary disease with (acute) exacerbation: Secondary | ICD-10-CM | POA: Insufficient documentation

## 2014-01-28 DIAGNOSIS — I10 Essential (primary) hypertension: Secondary | ICD-10-CM | POA: Insufficient documentation

## 2014-01-28 HISTORY — DX: Chronic obstructive pulmonary disease, unspecified: J44.9

## 2014-01-28 LAB — CBC WITH DIFFERENTIAL/PLATELET
BASOS PCT: 1 % (ref 0–1)
Basophils Absolute: 0.1 10*3/uL (ref 0.0–0.1)
Eosinophils Absolute: 0.2 10*3/uL (ref 0.0–0.7)
Eosinophils Relative: 2 % (ref 0–5)
HCT: 40.9 % (ref 36.0–46.0)
HEMOGLOBIN: 13.4 g/dL (ref 12.0–15.0)
Lymphocytes Relative: 14 % (ref 12–46)
Lymphs Abs: 1.5 10*3/uL (ref 0.7–4.0)
MCH: 30 pg (ref 26.0–34.0)
MCHC: 32.8 g/dL (ref 30.0–36.0)
MCV: 91.7 fL (ref 78.0–100.0)
MONOS PCT: 15 % — AB (ref 3–12)
Monocytes Absolute: 1.6 10*3/uL — ABNORMAL HIGH (ref 0.1–1.0)
Neutro Abs: 7.6 10*3/uL (ref 1.7–7.7)
Neutrophils Relative %: 70 % (ref 43–77)
Platelets: 207 10*3/uL (ref 150–400)
RBC: 4.46 MIL/uL (ref 3.87–5.11)
RDW: 14.8 % (ref 11.5–15.5)
WBC: 10.9 10*3/uL — ABNORMAL HIGH (ref 4.0–10.5)

## 2014-01-28 LAB — URINALYSIS, ROUTINE W REFLEX MICROSCOPIC
Glucose, UA: 100 mg/dL — AB
HGB URINE DIPSTICK: NEGATIVE
Ketones, ur: 15 mg/dL — AB
Leukocytes, UA: NEGATIVE
Nitrite: NEGATIVE
Protein, ur: 100 mg/dL — AB
SPECIFIC GRAVITY, URINE: 1.029 (ref 1.005–1.030)
UROBILINOGEN UA: 1 mg/dL (ref 0.0–1.0)
pH: 5.5 (ref 5.0–8.0)

## 2014-01-28 LAB — COMPREHENSIVE METABOLIC PANEL
ALT: 8 U/L (ref 0–35)
AST: 12 U/L (ref 0–37)
Albumin: 2.6 g/dL — ABNORMAL LOW (ref 3.5–5.2)
Alkaline Phosphatase: 87 U/L (ref 39–117)
BILIRUBIN TOTAL: 0.3 mg/dL (ref 0.3–1.2)
BUN: 25 mg/dL — AB (ref 6–23)
CHLORIDE: 104 meq/L (ref 96–112)
CO2: 28 mEq/L (ref 19–32)
Calcium: 9.5 mg/dL (ref 8.4–10.5)
Creatinine, Ser: 0.66 mg/dL (ref 0.50–1.10)
GFR calc non Af Amer: 79 mL/min — ABNORMAL LOW (ref >90)
GLUCOSE: 202 mg/dL — AB (ref 70–99)
Potassium: 3.9 mEq/L (ref 3.7–5.3)
Sodium: 143 mEq/L (ref 137–147)
TOTAL PROTEIN: 6.3 g/dL (ref 6.0–8.3)

## 2014-01-28 LAB — TROPONIN I

## 2014-01-28 LAB — URINE MICROSCOPIC-ADD ON

## 2014-01-28 LAB — PRO B NATRIURETIC PEPTIDE: PRO B NATRI PEPTIDE: 785.1 pg/mL — AB (ref 0–450)

## 2014-01-28 MED ORDER — SODIUM CHLORIDE 0.9 % IV BOLUS (SEPSIS)
500.0000 mL | Freq: Once | INTRAVENOUS | Status: AC
  Administered 2014-01-28: 500 mL via INTRAVENOUS

## 2014-01-28 MED ORDER — ALBUTEROL (5 MG/ML) CONTINUOUS INHALATION SOLN
10.0000 mg/h | INHALATION_SOLUTION | Freq: Once | RESPIRATORY_TRACT | Status: AC
  Administered 2014-01-28: 10 mg/h via RESPIRATORY_TRACT
  Filled 2014-01-28: qty 20

## 2014-01-28 MED ORDER — ALBUTEROL (5 MG/ML) CONTINUOUS INHALATION SOLN
10.0000 mg/h | INHALATION_SOLUTION | Freq: Once | RESPIRATORY_TRACT | Status: AC
  Administered 2014-01-28: 10 mg/h via RESPIRATORY_TRACT

## 2014-01-28 MED ORDER — DEXTROSE 5 % IV SOLN: 2.0000 g | Freq: Two times a day (BID) | INTRAVENOUS | Status: DC

## 2014-01-28 MED ORDER — SODIUM CHLORIDE 0.9 % IV BOLUS (SEPSIS): 250.0000 mL | Freq: Once | INTRAVENOUS | Status: DC

## 2014-01-28 MED ORDER — LEVOFLOXACIN 750 MG PO TABS
750.0000 mg | ORAL_TABLET | ORAL | Status: AC
  Administered 2014-01-28: 750 mg via ORAL
  Filled 2014-01-28: qty 1

## 2014-01-28 MED ORDER — VANCOMYCIN HCL IN DEXTROSE 1-5 GM/200ML-% IV SOLN: 1000.0000 mg | Freq: Once | INTRAVENOUS | Status: DC

## 2014-01-28 MED ORDER — PREDNISONE 50 MG PO TABS: ORAL_TABLET | ORAL | Status: DC

## 2014-01-28 MED ORDER — LEVOFLOXACIN 750 MG PO TABS: 750.0000 mg | ORAL_TABLET | Freq: Every day | ORAL | Status: DC

## 2014-01-28 NOTE — ED Notes (Signed)
MD asked to hold her antibiotics and cultures for 45 minutes pending breathing tx [facility]

## 2014-01-28 NOTE — ED Provider Notes (Signed)
CSN: [id number]     Arrival date & time 01/28/14  1109 History   First MD Initiated Contact with Patient 01/28/14 1116     Chief Complaint  Patient presents with  . Shortness of Breath     (Consider location/radiation/quality/duration/timing/severity/associated sxs/prior Treatment) Patient is a 80 y.o. female presenting with shortness of breath. The history is provided by the patient and the EMS personnel.  Shortness of Breath Severity:  Moderate Onset quality:  Gradual Timing:  Constant Progression:  Worsening Chronicity:  New Context: URI   Relieved by:  Nothing Worsened by:  Nothing tried Ineffective treatments:  None tried Associated symptoms: chest pain and cough   Associated symptoms: no abdominal pain, no fever, no headaches, no neck pain and no vomiting     Past Medical History  Diagnosis Date  . Hypertension   . Hyperlipidemia   . Thyroid disease   . Hypokalemia   . Vitamin D deficiency   . COPD (chronic obstructive pulmonary disease)    History reviewed. No pertinent past surgical history. No family history on file. History  Substance Use Topics  . Smoking status: Former Smoker    Types: Cigarettes  . Smokeless tobacco: Not on file  . Alcohol Use: No   OB History   Grav Para Term Preterm Abortions TAB SAB Ect Mult Living                 Review of Systems  Constitutional: Negative for fever and fatigue.  HENT: Negative for congestion and drooling.   Eyes: Negative for pain.  Respiratory: Positive for cough and shortness of breath.   Cardiovascular: Positive for chest pain.  Gastrointestinal: Negative for nausea, vomiting, abdominal pain and diarrhea.  Genitourinary: Negative for dysuria and hematuria.  Musculoskeletal: Negative for back pain, gait problem and neck pain.  Skin: Negative for color change.  Neurological: Negative for dizziness and headaches.  Hematological: Negative for adenopathy.  Psychiatric/Behavioral: Negative for behavioral  problems.  All other systems reviewed and are negative.     Allergies  Review of patient's allergies indicates no known allergies.  Home Medications   Prior to Admission medications   Medication Sig Start Date End Date Taking? Authorizing Provider  atorvastatin (LIPITOR) 10 MG tablet Take 10 mg by mouth at bedtime.    Historical Provider, MD  feeding supplement (ENSURE) PUDG Take 1 Container by mouth 3 (three) times daily between meals. 03/12/12   Simonne MartinetPeter E Babcock, NP  metoprolol (LOPRESSOR) 50 MG tablet Take 1 tablet (50 mg total) by mouth 2 (two) times daily. 03/12/12 08/07/13  Simonne MartinetPeter E Babcock, NP  Multiple Vitamin (MULTIVITAMIN WITH MINERALS) TABS Take 1 tablet by mouth daily. 03/12/12   Simonne MartinetPeter E Babcock, NP  Vitamin D, Ergocalciferol, (DRISDOL) 50000 UNITS CAPS Take 50,000 Units by mouth 2 (two) times a week.    Historical Provider, MD   BP 131/76  Pulse 90  Temp(Src) 98.4 F (36.9 C) (Oral)  Resp 30  Ht 5\' 4"  (1.626 m)  SpO2 95% Physical Exam  Nursing note and vitals reviewed. Constitutional: She is oriented to person, place, and time. She appears well-developed and well-nourished.  HENT:  Head: Normocephalic.  Mouth/Throat: Oropharynx is clear and moist. No oropharyngeal exudate.  Eyes: Conjunctivae and EOM are normal. Pupils are equal, round, and reactive to light.  Neck: Normal range of motion. Neck supple.  Cardiovascular: Normal rate, regular rhythm, normal heart sounds and intact distal pulses.  Exam reveals no gallop and no friction rub.  No murmur heard. Pulmonary/Chest: She is in respiratory distress. She has wheezes.  Diminished breath sounds bilaterally. Diffusely rhonchorous. Mild expiratory wheeze diffusely.  Abdominal: Soft. Bowel sounds are normal. There is no tenderness. There is no rebound and no guarding.  Musculoskeletal: Normal range of motion. She exhibits no edema and no tenderness.  Neurological: She is alert and oriented to person, place, and time.   Skin: Skin is warm and dry.  Psychiatric: She has a normal mood and affect. Her behavior is normal.    ED Course  Procedures (including critical care time) Labs Review Labs Reviewed  CBC WITH DIFFERENTIAL - Abnormal; Notable for the following:    WBC 10.9 (*)    Monocytes Relative 15 (*)    Monocytes Absolute 1.6 (*)    All other components within normal limits  COMPREHENSIVE METABOLIC PANEL - Abnormal; Notable for the following:    Glucose, Bld 202 (*)    BUN 25 (*)    Albumin 2.6 (*)    GFR calc non Af Amer 79 (*)    All other components within normal limits  PRO B NATRIURETIC PEPTIDE - Abnormal; Notable for the following:    Pro B Natriuretic peptide (BNP) 785.1 (*)    All other components within normal limits  URINALYSIS, ROUTINE W REFLEX MICROSCOPIC - Abnormal; Notable for the following:    Color, Urine AMBER (*)    Glucose, UA 100 (*)    Bilirubin Urine MODERATE (*)    Ketones, ur 15 (*)    Protein, ur 100 (*)    All other components within normal limits  URINE MICROSCOPIC-ADD ON - Abnormal; Notable for the following:    Bacteria, UA FEW (*)    Crystals CA OXALATE CRYSTALS (*)    All other components within normal limits  TROPONIN I    Imaging Review Dg Chest Port 1 View  01/28/2014   CLINICAL DATA:  Chest pain.  Cough.  EXAM: PORTABLE CHEST - 1 VIEW  COMPARISON:  08/07/2013.  FINDINGS: Mediastinum unremarkable. Stable mild cardiomegaly. Stable prominent pulmonary arteries bilaterally. A component of pulmonary hypertension cannot be excluded. Chronic interstitial lung disease again noted. Pleural parenchymal thickening along left lung base consistent with scarring. No pleural effusion or pneumothorax. No acute bony abnormality.  IMPRESSION: 1. Chronic interstitial lung disease with pleural parenchymal scarring left lung base. No acute pulmonary disease. 2. Stable mild cardiomegaly with prominent central pulmonary arteries, component of pulmonary hypertension may be  present.   Electronically Signed   By: Maisie Fushomas  Register   On: 01/28/2014 11:45     EKG Interpretation   Date/Time:  Thursday January 28 2014 11:17:19 EDT Ventricular Rate:  89 PR Interval:  163 QRS Duration: 141 QT Interval:  392 QTC Calculation: 477 R Axis:   75 Text Interpretation:  Sinus rhythm Right bundle branch block non-spec ST T  changes likely related to RBBB Confirmed by HARRISON  MD, FORREST ([id number])  on 01/28/2014 11:30:26 AM      MDM   Final diagnoses:  COPD exacerbation    11:22 AM 80 y.o. female w hx of HTN, HLP, COPD who presents with worsening cough and shortness of breath. EMS reports that the patient is supposed to be on 2 L nasal cannula but is noncompliant. She was found to have an oxygen saturation in the 70s at her facility this morning. He was also noted that her cough and shortness of breath were worse. She is afebrile and tachypneic here. She has a Most  form with her which I reviewed. It states that she should not be intubated or mechanically ventilated. Antibiotics are okay. She has diminished breath sounds with rhonchi bilaterally. Mild expiratory wheeze. Will give breathing treatment. Patient got steroids in route. Will get lab work and imaging. Pt a/o x3.   4:23 PM Discussed case with Dr. Evlyn Kanner. As patient has improved he believes she is appropriate for discharge and to follow up closely with him tomorrow. He states that he will see her at her facility. He is aware of RR and inc in O2 from 2L to 4L. Pt has improved significantly in the ED. RR has decreased, she remains alert and oriented, is tolerating po. Will give a dose of levaquin and Rx's for home.   4:24 PM: I discussed case w/ grandson and pt who are happy w/ plan.  I have discussed the diagnosis/risks/treatment options with the patient and family and believe the pt to be eligible for discharge home to follow-up with pcp tomorrow. We also discussed returning to the ED immediately if new or worsening sx  occur. We discussed the sx which are most concerning (e.g., worsening sob, fever, inability to tolerate abx) that necessitate immediate return. Medications administered to the patient during their visit and any new prescriptions provided to the patient are listed below.  Medications given during this visit Medications  vancomycin (VANCOCIN) IVPB 1000 mg/200 mL premix (not administered)  ceFEPIme (MAXIPIME) 2 g in dextrose 5 % 50 mL IVPB (not administered)  levofloxacin (LEVAQUIN) tablet 750 mg (not administered)  albuterol (PROVENTIL,VENTOLIN) solution continuous neb (10 mg/hr Nebulization Given 01/28/14 1132)  sodium chloride 0.9 % bolus 500 mL (0 mLs Intravenous Stopped 01/28/14 1605)  albuterol (PROVENTIL,VENTOLIN) solution continuous neb (10 mg/hr Nebulization Given 01/28/14 1451)    New Prescriptions   LEVOFLOXACIN (LEVAQUIN) 750 MG TABLET    Take 1 tablet (750 mg total) by mouth daily. X 4 days   PREDNISONE (DELTASONE) 50 MG TABLET    Take 1 tablet by mouth daily for 4 days.     Junius Argyle, MD 01/28/14 2105

## 2014-01-28 NOTE — ED Notes (Signed)
Pt brought from [facility] via [facility] for sob.  Initially 72% per staff.  Staff gave pt 2.5 mg albuterol tx.  When ems arrived pt with sats of 92%.  Pt given 125 mg solumedrol IV, and 1 brthng tx of 5/0.5 alb/atro.

## 2014-01-28 NOTE — ED Notes (Signed)
IV removed prior to discharge, catheter intact

## 2014-01-28 NOTE — ED Notes (Signed)
Pt reassessed by MD [facility] repaged.  Pt updated

## 2014-05-15 ENCOUNTER — Encounter (HOSPITAL_COMMUNITY): Payer: Self-pay | Admitting: Emergency Medicine

## 2014-05-15 ENCOUNTER — Emergency Department (HOSPITAL_COMMUNITY): Payer: Medicare Other

## 2014-05-15 ENCOUNTER — Inpatient Hospital Stay (HOSPITAL_COMMUNITY)
Admission: EM | Admit: 2014-05-15 | Discharge: 2014-05-19 | DRG: 190 | Disposition: A | Discharge status: Skilled Nursing Facility | Payer: Medicare Other | Attending: Internal Medicine | Admitting: Internal Medicine

## 2014-05-15 DIAGNOSIS — J440 Chronic obstructive pulmonary disease with acute lower respiratory infection: Secondary | ICD-10-CM | POA: Diagnosis present

## 2014-05-15 DIAGNOSIS — J441 Chronic obstructive pulmonary disease with (acute) exacerbation: Secondary | ICD-10-CM | POA: Diagnosis not present

## 2014-05-15 DIAGNOSIS — Z79899 Other long term (current) drug therapy: Secondary | ICD-10-CM

## 2014-05-15 DIAGNOSIS — Z87891 Personal history of nicotine dependence: Secondary | ICD-10-CM

## 2014-05-15 DIAGNOSIS — E876 Hypokalemia: Secondary | ICD-10-CM | POA: Diagnosis present

## 2014-05-15 DIAGNOSIS — J9620 Acute and chronic respiratory failure, unspecified whether with hypoxia or hypercapnia: Secondary | ICD-10-CM | POA: Diagnosis present

## 2014-05-15 DIAGNOSIS — Z66 Do not resuscitate: Secondary | ICD-10-CM | POA: Diagnosis present

## 2014-05-15 DIAGNOSIS — K219 Gastro-esophageal reflux disease without esophagitis: Secondary | ICD-10-CM | POA: Diagnosis present

## 2014-05-15 DIAGNOSIS — I4510 Unspecified right bundle-branch block: Secondary | ICD-10-CM | POA: Diagnosis present

## 2014-05-15 DIAGNOSIS — E039 Hypothyroidism, unspecified: Secondary | ICD-10-CM | POA: Diagnosis present

## 2014-05-15 DIAGNOSIS — I10 Essential (primary) hypertension: Secondary | ICD-10-CM | POA: Diagnosis present

## 2014-05-15 DIAGNOSIS — G9340 Encephalopathy, unspecified: Secondary | ICD-10-CM

## 2014-05-15 DIAGNOSIS — J9601 Acute respiratory failure with hypoxia: Secondary | ICD-10-CM

## 2014-05-15 DIAGNOSIS — E785 Hyperlipidemia, unspecified: Secondary | ICD-10-CM | POA: Diagnosis present

## 2014-05-15 DIAGNOSIS — Z7982 Long term (current) use of aspirin: Secondary | ICD-10-CM

## 2014-05-15 DIAGNOSIS — R0902 Hypoxemia: Secondary | ICD-10-CM

## 2014-05-15 DIAGNOSIS — J209 Acute bronchitis, unspecified: Secondary | ICD-10-CM | POA: Diagnosis present

## 2014-05-15 DIAGNOSIS — E038 Other specified hypothyroidism: Secondary | ICD-10-CM

## 2014-05-15 DIAGNOSIS — E559 Vitamin D deficiency, unspecified: Secondary | ICD-10-CM | POA: Diagnosis present

## 2014-05-15 DIAGNOSIS — I5032 Chronic diastolic (congestive) heart failure: Secondary | ICD-10-CM | POA: Diagnosis present

## 2014-05-15 LAB — PRO B NATRIURETIC PEPTIDE: Pro B Natriuretic peptide (BNP): 892.3 pg/mL — ABNORMAL HIGH (ref 0–450)

## 2014-05-15 LAB — CBC WITH DIFFERENTIAL/PLATELET
Basophils Absolute: 0 10*3/uL (ref 0.0–0.1)
Basophils Relative: 0 % (ref 0–1)
Eosinophils Absolute: 0.2 10*3/uL (ref 0.0–0.7)
Eosinophils Relative: 2 % (ref 0–5)
HCT: 46.9 % — ABNORMAL HIGH (ref 36.0–46.0)
Hemoglobin: 15.5 g/dL — ABNORMAL HIGH (ref 12.0–15.0)
Lymphocytes Relative: 19 % (ref 12–46)
Lymphs Abs: 1.4 10*3/uL (ref 0.7–4.0)
MCH: 29.8 pg (ref 26.0–34.0)
MCHC: 33 g/dL (ref 30.0–36.0)
MCV: 90 fL (ref 78.0–100.0)
Monocytes Absolute: 0.7 10*3/uL (ref 0.1–1.0)
Monocytes Relative: 9 % (ref 3–12)
Neutro Abs: 5.3 10*3/uL (ref 1.7–7.7)
Neutrophils Relative %: 70 % (ref 43–77)
Platelets: 180 10*3/uL (ref 150–400)
RBC: 5.21 MIL/uL — ABNORMAL HIGH (ref 3.87–5.11)
RDW: 14 % (ref 11.5–15.5)
WBC: 7.6 10*3/uL (ref 4.0–10.5)

## 2014-05-15 LAB — COMPREHENSIVE METABOLIC PANEL
ALBUMIN: 3.3 g/dL — AB (ref 3.5–5.2)
ALT: 9 U/L (ref 0–35)
AST: 15 U/L (ref 0–37)
Alkaline Phosphatase: 85 U/L (ref 39–117)
Anion gap: 11 (ref 5–15)
BUN: 18 mg/dL (ref 6–23)
CALCIUM: 9.7 mg/dL (ref 8.4–10.5)
CHLORIDE: 100 meq/L (ref 96–112)
CO2: 31 mEq/L (ref 19–32)
Creatinine, Ser: 0.66 mg/dL (ref 0.50–1.10)
GFR calc Af Amer: >90 mL/min (ref >90)
GFR calc non Af Amer: 79 mL/min — ABNORMAL LOW (ref >90)
Glucose, Bld: 172 mg/dL — ABNORMAL HIGH (ref 70–99)
Potassium: 3.6 mEq/L — ABNORMAL LOW (ref 3.7–5.3)
Sodium: 142 mEq/L (ref 137–147)
Total Protein: 6.8 g/dL (ref 6.0–8.3)

## 2014-05-15 LAB — TROPONIN I

## 2014-05-15 MED ORDER — SODIUM CHLORIDE 0.9 % IV BOLUS (SEPSIS)
500.0000 mL | Freq: Once | INTRAVENOUS | Status: AC
  Administered 2014-05-16: 500 mL via INTRAVENOUS

## 2014-05-15 MED ORDER — ALBUTEROL SULFATE (2.5 MG/3ML) 0.083% IN NEBU: 5.0000 mg | INHALATION_SOLUTION | Freq: Once | RESPIRATORY_TRACT | Status: DC

## 2014-05-15 MED ORDER — ALBUTEROL SULFATE (2.5 MG/3ML) 0.083% IN NEBU
5.0000 mg | INHALATION_SOLUTION | Freq: Once | RESPIRATORY_TRACT | Status: AC
  Administered 2014-05-16: 5 mg via RESPIRATORY_TRACT
  Filled 2014-05-15: qty 6

## 2014-05-15 NOTE — ED Notes (Signed)
Pt is from [name]. Pt has had a productive cough x 1 week causing SOB.Pt also reports bilateral chest wall pain. Pt received a 5mg  albuterol treatment en route. Pt satting 92% on RA, placed on 3L of O2, and is satting 98%.

## 2014-05-15 NOTE — ED Notes (Signed)
Patient placed on O2 @ 2lpm. sats are now to 93% 2LPM O2.

## 2014-05-15 NOTE — H&P (Signed)
[facility]                                                                                    Patient Demographics  [name] [name], is a 80 y.o. female  CSN: [id number]  MRN: [id number]  DOB - 10/26/1928  Admit Date - 05/15/2014  Outpatient Primary MD for the patient is No primary provider on file.   With History of -  Past Medical History  Diagnosis Date  . Hypertension   . Hyperlipidemia   . Thyroid disease   . Hypokalemia   . Vitamin D deficiency   . COPD (chronic obstructive pulmonary disease)       History reviewed. No pertinent past surgical history.  in for   Chief Complaint  Patient presents with  . Shortness of Breath  . Cough     HPI  [name] [place] C[place]  is a 80 y.o. female, nursing home resident with past medical history significant for COPD presenting with one-week history of , whitish productive cough and pleuritic chest pain. The patient is not a good historian. She denies abdominal pain nausea or vomiting. No reports of fever chills. Patient was hypoxemic in the emergency room upon exertion    Review of Systems    In addition to the HPI above, No Fever-chills, No Headache, No changes with Vision or hearing, No problems swallowing food or Liquids, No Abdominal pain, No Nausea or Vommitting, Bowel movements are regular, No Blood in stool or Urine, No dysuria, No new skin rashes or bruises, No new joints pains-aches,  No new weakness, tingling, numbness in any extremity, No recent weight gain or loss, No polyuria, polydypsia or polyphagia, No significant Mental Stressors.  A full 10 point Review of Systems was done, except as stated above, all other Review of Systems were negative.   Social History History  Substance Use Topics  . Smoking status: Former Smoker    Types: Cigarettes  . Smokeless tobacco: Not on file  . Alcohol Use: No     Family History Unable to obtain due to patient's condition  Prior  to Admission medications   Medication Sig Start Date End Date Taking? Authorizing Provider  albuterol (PROVENTIL) (2.5 MG/3ML) 0.083% nebulizer solution Take 2.5 mg by nebulization 4 (four) times daily.   Yes Historical Provider, MD  amLODipine (NORVASC) 10 MG tablet Take 10 mg by mouth daily.   Yes Historical Provider, MD  aspirin 325 MG tablet Take 325 mg by mouth every 6 (six) hours as needed for moderate pain.    Yes Historical Provider, MD  dextromethorphan 7.5 MG/5ML SYRP Take 7.5 mg by mouth every 6 (six) hours as needed.   Yes Historical Provider, MD  ipratropium (ATROVENT) 0.02 % nebulizer solution Take 0.5 mg by nebulization 4 (four) times daily.   Yes Historical Provider, MD  levothyroxine (SYNTHROID, LEVOTHROID) 125 MCG tablet Take 125 mcg by mouth daily before breakfast.   Yes Historical Provider, MD  metoprolol (LOPRESSOR) 50 MG tablet Take 50 mg by mouth 2 (two) times daily.   Yes Historical Provider, MD  omeprazole (PRILOSEC) 20 MG capsule Take 20 mg by mouth daily.  Yes Historical Provider, MD  potassium chloride SA (K-DUR,KLOR-CON) 20 MEQ tablet Take 20 mEq by mouth daily.   Yes Historical Provider, MD  pravastatin (PRAVACHOL) 40 MG tablet Take 40 mg by mouth at bedtime.   Yes Historical Provider, MD  Vitamin D, Ergocalciferol, (DRISDOL) 50000 UNITS CAPS Take 50,000 Units by mouth every Sunday. Take on Sundays   Yes Historical Provider, MD    No Known Allergies  Physical Exam  Vitals  Blood pressure 136/87, pulse 102, temperature 97.9 F (36.6 C), temperature source Oral, resp. rate 30, height 5\' 4"  (1.626 m), weight 55.792 kg (123 lb), SpO2 99.00%.   1. General elderly female, very pleasant in no respiratory distress  2. Normal affect and insight, Not Suicidal or Homicidal, confused.  3. No F.N deficits, ALL C.Nerves Intact,   4. Ears and Eyes appear Normal, Conjunctivae clear, PERRLA. Moist Oral Mucosa.  5. Supple Neck, No JVD, No cervical lymphadenopathy  appriciated, No Carotid Bruits.  6. Symmetrical Chest wall movement, scattered coarse rhonchi.  7. RRR, tachycardic, No Parasternal Heave.  8. Positive Bowel Sounds, Abdomen Soft, Non tender, No organomegaly appriciated,No rebound -guarding or rigidity.  9.  No Cyanosis, Normal Skin Turgor, No Skin Rash or Bruise.  10. Good muscle tone,  joints appear normal , no effusions, Normal ROM.  11. No Palpable Lymph Nodes in Neck or Axillae    Data Review  CBC  Recent Labs Lab 05/15/14 2119  WBC 7.6  HGB 15.5*  HCT 46.9*  PLT 180  MCV 90.0  MCH 29.8  MCHC 33.0  RDW 14.0  LYMPHSABS 1.4  MONOABS 0.7  EOSABS 0.2  BASOSABS 0.0   ------------------------------------------------------------------------------------------------------------------  Chemistries   Recent Labs Lab 05/15/14 2119  NA 142  K 3.6*  CL 100  CO2 31  GLUCOSE 172*  BUN 18  CREATININE 0.66  CALCIUM 9.7  AST 15  ALT 9  ALKPHOS 85  BILITOT <0.2*   ------------------------------------------------------------------------------------------------------------------ estimated creatinine clearance is 46 ml/min (by C-G formula based on Cr of 0.66). ------------------------------------------------------------------------------------------------------------------ No results found for this basename: TSH, T4TOTAL, FREET3, T3FREE, THYROIDAB,  in the last 72 hours   Coagulation profile No results found for this basename: INR, PROTIME,  in the last 168 hours ------------------------------------------------------------------------------------------------------------------- No results found for this basename: DDIMER,  in the last 72 hours -------------------------------------------------------------------------------------------------------------------  Cardiac Enzymes  Recent Labs Lab 05/15/14 2119  TROPONINI <0.30    ------------------------------------------------------------------------------------------------------------------ No components found with this basename: POCBNP,    ---------------------------------------------------------------------------------------------------------------  Urinalysis    Component Value Date/Time   COLORURINE AMBER* 01/28/2014 1205   APPEARANCEUR CLEAR 01/28/2014 1205   LABSPEC 1.029 01/28/2014 1205   PHURINE 5.5 01/28/2014 1205   GLUCOSEU 100* 01/28/2014 1205   HGBUR NEGATIVE 01/28/2014 1205   BILIRUBINUR MODERATE* 01/28/2014 1205   KETONESUR 15* 01/28/2014 1205   PROTEINUR 100* 01/28/2014 1205   UROBILINOGEN 1.0 01/28/2014 1205   NITRITE NEGATIVE 01/28/2014 1205   LEUKOCYTESUR NEGATIVE 01/28/2014 1205    ----------------------------------------------------------------------------------------------------------------   Imaging results:   Dg Chest 2 View  05/15/2014   CLINICAL DATA:  Shortness of breath.  EXAM: CHEST  2 VIEW  COMPARISON:  01/28/2014  FINDINGS: Emphysematous changes in the lungs. The heart size and mediastinal contours are within normal limits. Both lungs are clear. The visualized skeletal structures are unremarkable.  IMPRESSION: No active cardiopulmonary disease.   Electronically Signed   By: Burman Nieves M.D.   On: 05/15/2014 22:34    My personal review of EKG: Sinus tachycardia  with right bundle branch block at a rate of 104    Assessment & Plan   1. COPD exacerbation/bronchitis 2. History of hypertension hyperlipidemia thyroid disease  Plan  Admit to MedSurg Nebulizer treatments Solu-Medrol IV Levaquin May need chest PT to mobilize her secretions  DVT Prophylaxis the  AM Labs Ordered, also please review Full Orders  Code Status full  Disposition Plan: Back to nursing home  Time spent in minutes : 34 minutes  Condition GUARDED   @SIGNATURE @

## 2014-05-15 NOTE — ED Notes (Signed)
Patient ambulating with o2 sat started at 94% RA at bedside. Wile walking Sats dropped to 84% RA. Patient returned to bed. Sat are now

## 2014-05-15 NOTE — ED Provider Notes (Signed)
Medical screening examination/treatment/procedure(s) were conducted as a shared visit with non-physician practitioner(s) and myself.  I personally evaluated the patient during the encounter.   EKG Interpretation   Date/Time:  Saturday May 15 2014 21:24:13 EDT Ventricular Rate:  104 PR Interval:  168 QRS Duration: 143 QT Interval:  380 QTC Calculation: 500 R Axis:   50 Text Interpretation:  Sinus tachycardia Right bundle branch block Artifact  in lead(s) I II aVR aVL No significant change since last tracing Confirmed  by Avis Tirone  MD, Lujuana Kapler [phone]([id number]) on 05/15/2014 11:47:34 PM       Results for orders placed during the hospital encounter of 05/15/14  CBC WITH DIFFERENTIAL      Result Value Ref Range   WBC 7.6  4.0 - 10.5 K/uL   RBC 5.21 (*) 3.87 - 5.11 MIL/uL   Hemoglobin 15.5 (*) 12.0 - 15.0 g/dL   HCT 60.446.9 (*) 54.036.0 - 98.146.0 %   MCV 90.0  78.0 - 100.0 fL   MCH 29.8  26.0 - 34.0 pg   MCHC 33.0  30.0 - 36.0 g/dL   RDW 19.114.0  47.811.5 - 29.515.5 %   Platelets 180  150 - 400 K/uL   Neutrophils Relative % 70  43 - 77 %   Neutro Abs 5.3  1.7 - 7.7 K/uL   Lymphocytes Relative 19  12 - 46 %   Lymphs Abs 1.4  0.7 - 4.0 K/uL   Monocytes Relative 9  3 - 12 %   Monocytes Absolute 0.7  0.1 - 1.0 K/uL   Eosinophils Relative 2  0 - 5 %   Eosinophils Absolute 0.2  0.0 - 0.7 K/uL   Basophils Relative 0  0 - 1 %   Basophils Absolute 0.0  0.0 - 0.1 K/uL  COMPREHENSIVE METABOLIC PANEL      Result Value Ref Range   Sodium 142  137 - 147 mEq/L   Potassium 3.6 (*) 3.7 - 5.3 mEq/L   Chloride 100  96 - 112 mEq/L   CO2 31  19 - 32 mEq/L   Glucose, Bld 172 (*) 70 - 99 mg/dL   BUN 18  6 - 23 mg/dL   Creatinine, Ser 6.210.66  0.50 - 1.10 mg/dL   Calcium 9.7  8.4 - 30.810.5 mg/dL   Total Protein 6.8  6.0 - 8.3 g/dL   Albumin 3.3 (*) 3.5 - 5.2 g/dL   AST 15  0 - 37 U/L   ALT 9  0 - 35 U/L   Alkaline Phosphatase 85  39 - 117 U/L   Total Bilirubin <0.2 (*) 0.3 - 1.2 mg/dL   GFR calc non Af Amer 79 (*) >90  mL/min   GFR calc Af Amer >90  >90 mL/min   Anion gap 11  5 - 15  TROPONIN I      Result Value Ref Range   Troponin I <0.30  <0.30 ng/mL  PRO B NATRIURETIC PEPTIDE      Result Value Ref Range   Pro B Natriuretic peptide (BNP) 892.3 (*) 0 - 450 pg/mL   Dg Chest 2 View  05/15/2014   CLINICAL DATA:  Shortness of breath.  EXAM: CHEST  2 VIEW  COMPARISON:  01/28/2014  FINDINGS: Emphysematous changes in the lungs. The heart size and mediastinal contours are within normal limits. Both lungs are clear. The visualized skeletal structures are unremarkable.  IMPRESSION: No active cardiopulmonary disease.   Electronically Signed   By: Marisa CyphersWilliam  Stevens M.D.  On: 05/15/2014 22:34    Patient seen by me. Patient from whom falls nursing facility. Patient with productive cough x1 week and shortness of breath all associated with bilateral chest wall pain. Patient has a history of COPD. Clinically we thought perhaps we would find a pneumonia. But it seems to be an exacerbation of her COPD. Troponin was negative. White count no leukocytosis. Chest x-ray was negative for pneumonia. Patient did drop her saturations here below 90%. Patient will require admission for exacerbation of COPD. Patient says originally on arrival here was 92% room air. She was placed on 3 L of oxygen and was satting 98% on that. EKG without any acute changes compared to previous.  Vanetta Mulders, MD 05/15/14 2348

## 2014-05-15 NOTE — ED Provider Notes (Signed)
CSN: [id number]     Arrival date & time 05/15/14  2059 History   First MD Initiated Contact with Patient 05/15/14 2104     Chief Complaint  Patient presents with  . Shortness of Breath  . Cough     (Consider location/radiation/quality/duration/timing/severity/associated sxs/prior Treatment) Patient is a 80 y.o. female presenting with shortness of breath and cough. The history is provided by the patient. No language interpreter was used.  Shortness of Breath Associated symptoms: chest pain and cough   Associated symptoms: no abdominal pain, no fever, no headaches and no vomiting   Cough Associated symptoms: chest pain and shortness of breath   Associated symptoms: no chills, no fever and no headaches   [name] [name] is an 80 y/o F with PMHx of HTN, HLD, hypokalemia, COPD on oxygen therapy as needed 2L/min, vitamin D deficiency presenting to the ED with shortness of breath that has been ongoing for the past 2 days - worse today. Stated that she has been having an ongoing cough for the past several weeks that is productive. Stated that when she coughs a whitish, clear sputum comes up. Reported that she has been having chest pain that started today - all over worse with coughing. Stated that she currently lives in a nursing facility. Denied fever, chills, neck pain, neck stiffness, nausea, vomiting, diarrhea, headache, dizziness, leg swelling. PCP Dr. Hedy Jacob   Past Medical History  Diagnosis Date  . Hypertension   . Hyperlipidemia   . Thyroid disease   . Hypokalemia   . Vitamin D deficiency   . COPD (chronic obstructive pulmonary disease)    History reviewed. No pertinent past surgical history. History reviewed. No pertinent family history. History  Substance Use Topics  . Smoking status: Former Smoker    Types: Cigarettes  . Smokeless tobacco: Not on file  . Alcohol Use: No   OB History   Grav Para Term Preterm Abortions TAB SAB Ect Mult Living                  Review of Systems  Constitutional: Negative for fever and chills.  Respiratory: Positive for cough and shortness of breath. Negative for chest tightness.   Cardiovascular: Positive for chest pain.  Gastrointestinal: Negative for nausea, vomiting, abdominal pain and diarrhea.  Neurological: Negative for weakness and headaches.      Allergies  Review of patient's allergies indicates no known allergies.  Home Medications   Prior to Admission medications   Medication Sig Start Date End Date Taking? Authorizing Provider  albuterol (PROVENTIL) (2.5 MG/3ML) 0.083% nebulizer solution Take 2.5 mg by nebulization 4 (four) times daily.   Yes Historical Provider, MD  amLODipine (NORVASC) 10 MG tablet Take 10 mg by mouth daily.   Yes Historical Provider, MD  aspirin 325 MG tablet Take 325 mg by mouth every 6 (six) hours as needed for moderate pain.    Yes Historical Provider, MD  dextromethorphan 7.5 MG/5ML SYRP Take 7.5 mg by mouth every 6 (six) hours as needed.   Yes Historical Provider, MD  ipratropium (ATROVENT) 0.02 % nebulizer solution Take 0.5 mg by nebulization 4 (four) times daily.   Yes Historical Provider, MD  levothyroxine (SYNTHROID, LEVOTHROID) 125 MCG tablet Take 125 mcg by mouth daily before breakfast.   Yes Historical Provider, MD  metoprolol (LOPRESSOR) 50 MG tablet Take 50 mg by mouth 2 (two) times daily.   Yes Historical Provider, MD  omeprazole (PRILOSEC) 20 MG capsule Take 20 mg by mouth daily.  Yes Historical Provider, MD  potassium chloride SA (K-DUR,KLOR-CON) 20 MEQ tablet Take 20 mEq by mouth daily.   Yes Historical Provider, MD  pravastatin (PRAVACHOL) 40 MG tablet Take 40 mg by mouth at bedtime.   Yes Historical Provider, MD  Vitamin D, Ergocalciferol, (DRISDOL) 50000 UNITS CAPS Take 50,000 Units by mouth every Sunday. Take on Sundays   Yes Historical Provider, MD   BP 136/87  Pulse 102  Temp(Src) 97.9 F (36.6 C) (Oral)  Resp 30  Ht 5\' 4"  (1.626 m)  Wt 123 lb  (55.792 kg)  BMI 21.10 kg/m2  SpO2 99% Physical Exam  Nursing note and vitals reviewed. Constitutional: She is oriented to person, place, and time. She appears well-developed and well-nourished. No distress.  HENT:  Head: Normocephalic and atraumatic.  Mouth/Throat: Oropharynx is clear and moist. No oropharyngeal exudate.  Eyes: Conjunctivae and EOM are normal. Pupils are equal, round, and reactive to light. Right eye exhibits no discharge. Left eye exhibits no discharge.  Neck: Normal range of motion. Neck supple. No tracheal deviation present.  Cardiovascular: Normal rate, regular rhythm and normal heart sounds.  Exam reveals no friction rub.   No murmur heard. Pulmonary/Chest: Effort normal. No respiratory distress. She has no wheezes. She has rales.  Patient is able to speak in full sentences, but does have to take a deep breath in after each sentence Rales and coarse crackles heard on bilateral sides to lower lobes bilaterally  Negative stridor  Patient currently placed on oxygen therapy via nasal canula  Musculoskeletal: Normal range of motion.  Full ROM to upper and lower extremities without difficulty noted, negative ataxia noted.  Lymphadenopathy:    She has no cervical adenopathy.  Neurological: She is alert and oriented to person, place, and time. No cranial nerve deficit. She exhibits normal muscle tone. Coordination normal.  Cranial nerves III-XII grossly intact Strength 5+/5+ to upper and lower extremities bilaterally with resistance applied, equal distribution noted Equal grip strength bilaterally  Negative facial drooping Negative slurred speech  Negative aphasia Patient follows commands well  Patient responds to questions appropriately  Negative arm drift  Skin: Skin is warm and dry. No rash noted. She is not diaphoretic. No erythema.  Psychiatric: She has a normal mood and affect. Her behavior is normal. Thought content normal.    ED Course  Procedures  (including critical care time)  11:22 PM Patient ambulated with pulse ox dropping to 87-86% on room air.   Results for orders placed during the hospital encounter of 05/15/14  CBC WITH DIFFERENTIAL      Result Value Ref Range   WBC 7.6  4.0 - 10.5 K/uL   RBC 5.21 (*) 3.87 - 5.11 MIL/uL   Hemoglobin 15.5 (*) 12.0 - 15.0 g/dL   HCT 16.1 (*) 09.6 - 04.5 %   MCV 90.0  78.0 - 100.0 fL   MCH 29.8  26.0 - 34.0 pg   MCHC 33.0  30.0 - 36.0 g/dL   RDW 40.9  81.1 - 91.4 %   Platelets 180  150 - 400 K/uL   Neutrophils Relative % 70  43 - 77 %   Neutro Abs 5.3  1.7 - 7.7 K/uL   Lymphocytes Relative 19  12 - 46 %   Lymphs Abs 1.4  0.7 - 4.0 K/uL   Monocytes Relative 9  3 - 12 %   Monocytes Absolute 0.7  0.1 - 1.0 K/uL   Eosinophils Relative 2  0 - 5 %  Eosinophils Absolute 0.2  0.0 - 0.7 K/uL   Basophils Relative 0  0 - 1 %   Basophils Absolute 0.0  0.0 - 0.1 K/uL  COMPREHENSIVE METABOLIC PANEL      Result Value Ref Range   Sodium 142  137 - 147 mEq/L   Potassium 3.6 (*) 3.7 - 5.3 mEq/L   Chloride 100  96 - 112 mEq/L   CO2 31  19 - 32 mEq/L   Glucose, Bld 172 (*) 70 - 99 mg/dL   BUN 18  6 - 23 mg/dL   Creatinine, Ser 1.610.66  0.50 - 1.10 mg/dL   Calcium 9.7  8.4 - 09.610.5 mg/dL   Total Protein 6.8  6.0 - 8.3 g/dL   Albumin 3.3 (*) 3.5 - 5.2 g/dL   AST 15  0 - 37 U/L   ALT 9  0 - 35 U/L   Alkaline Phosphatase 85  39 - 117 U/L   Total Bilirubin <0.2 (*) 0.3 - 1.2 mg/dL   GFR calc non Af Amer 79 (*) >90 mL/min   GFR calc Af Amer >90  >90 mL/min   Anion gap 11  5 - 15  TROPONIN I      Result Value Ref Range   Troponin I <0.30  <0.30 ng/mL  PRO B NATRIURETIC PEPTIDE      Result Value Ref Range   Pro B Natriuretic peptide (BNP) 892.3 (*) 0 - 450 pg/mL    Labs Review Labs Reviewed  CBC WITH DIFFERENTIAL - Abnormal; Notable for the following:    RBC 5.21 (*)    Hemoglobin 15.5 (*)    HCT 46.9 (*)    All other components within normal limits  COMPREHENSIVE METABOLIC PANEL - Abnormal;  Notable for the following:    Potassium 3.6 (*)    Glucose, Bld 172 (*)    Albumin 3.3 (*)    Total Bilirubin <0.2 (*)    GFR calc non Af Amer 79 (*)    All other components within normal limits  PRO B NATRIURETIC PEPTIDE - Abnormal; Notable for the following:    Pro B Natriuretic peptide (BNP) 892.3 (*)    All other components within normal limits  TROPONIN I    Imaging Review Dg Chest 2 View  05/15/2014   CLINICAL DATA:  Shortness of breath.  EXAM: CHEST  2 VIEW  COMPARISON:  01/28/2014  FINDINGS: Emphysematous changes in the lungs. The heart size and mediastinal contours are within normal limits. Both lungs are clear. The visualized skeletal structures are unremarkable.  IMPRESSION: No active cardiopulmonary disease.   Electronically Signed   By: Burman NievesWilliam  Stevens M.D.   On: 05/15/2014 22:34     EKG Interpretation   Date/Time:  Saturday May 15 2014 21:24:13 EDT Ventricular Rate:  104 PR Interval:  168 QRS Duration: 143 QT Interval:  380 QTC Calculation: 500 R Axis:   50 Text Interpretation:  Sinus tachycardia Right bundle branch block Artifact  in lead(s) I II aVR aVL No significant change since last tracing Confirmed  by [email or web address]  MD, SCOTT [phone] on 05/15/2014 11:47:34 PM      MDM   Final diagnoses:  COPD exacerbation  Hypoxia    Medications  sodium chloride 0.9 % bolus 500 mL (not administered)  albuterol (PROVENTIL) (2.5 MG/3ML) 0.083% nebulizer solution 5 mg (not administered)    Filed Vitals:   05/15/14 2110 05/15/14 2130 05/15/14 2145 05/15/14 2158  BP: 132/65 138/72 136/87 136/87  Pulse:  94  103 102  Temp: 97.9 F (36.6 C)     TempSrc: Oral     Resp: 29 28 30 30   Height: 5\' 4"  (1.626 m)     Weight: 123 lb (55.792 kg)     SpO2: 98% 96% 99% 99%   EKG sinus tachycardia with a heart rate of 104 beats per minute-right bundle branch block noted- no significant change since last tracing. Troponin negative elevation. BNP mildly elevated at 892.3. CBC  negative elevated white blood cell count. Hemoglobin mildly elevated at 15.5. CMP unremarkable. Chest x-ray negative for acute cardiac pulmonary disease. Patient placed on 23L of oxygen via nasal cannula secondary to drop in pulse ox on room air. Patient ambulated with drop in pulse ox 87% to 86% on room air. Patient presenting to the ED with COPD exacerbation and hypoxia. Patient admitted to the hospital. Patient not to MedSurg under the care of [facility]. Patient appears comfortable. Discussed plan for admission with patient who agrees to plan of care. Patient stable for transfer.  Raymon Mutton, PA-C 05/15/14 2356  Raymon Mutton, PA-C 05/15/14 2356

## 2014-05-16 ENCOUNTER — Encounter (HOSPITAL_COMMUNITY): Payer: Self-pay | Admitting: *Deleted

## 2014-05-16 DIAGNOSIS — J209 Acute bronchitis, unspecified: Secondary | ICD-10-CM | POA: Diagnosis present

## 2014-05-16 DIAGNOSIS — I10 Essential (primary) hypertension: Secondary | ICD-10-CM | POA: Diagnosis present

## 2014-05-16 DIAGNOSIS — Z66 Do not resuscitate: Secondary | ICD-10-CM | POA: Diagnosis present

## 2014-05-16 DIAGNOSIS — E785 Hyperlipidemia, unspecified: Secondary | ICD-10-CM | POA: Diagnosis present

## 2014-05-16 DIAGNOSIS — J9601 Acute respiratory failure with hypoxia: Secondary | ICD-10-CM

## 2014-05-16 DIAGNOSIS — J441 Chronic obstructive pulmonary disease with (acute) exacerbation: Secondary | ICD-10-CM | POA: Diagnosis present

## 2014-05-16 DIAGNOSIS — J440 Chronic obstructive pulmonary disease with acute lower respiratory infection: Secondary | ICD-10-CM | POA: Diagnosis present

## 2014-05-16 DIAGNOSIS — I4510 Unspecified right bundle-branch block: Secondary | ICD-10-CM | POA: Diagnosis present

## 2014-05-16 DIAGNOSIS — I517 Cardiomegaly: Secondary | ICD-10-CM

## 2014-05-16 DIAGNOSIS — Z7982 Long term (current) use of aspirin: Secondary | ICD-10-CM | POA: Diagnosis not present

## 2014-05-16 DIAGNOSIS — I5032 Chronic diastolic (congestive) heart failure: Secondary | ICD-10-CM | POA: Diagnosis present

## 2014-05-16 DIAGNOSIS — Z87891 Personal history of nicotine dependence: Secondary | ICD-10-CM | POA: Diagnosis not present

## 2014-05-16 DIAGNOSIS — E039 Hypothyroidism, unspecified: Secondary | ICD-10-CM | POA: Diagnosis present

## 2014-05-16 DIAGNOSIS — E559 Vitamin D deficiency, unspecified: Secondary | ICD-10-CM | POA: Diagnosis present

## 2014-05-16 DIAGNOSIS — E038 Other specified hypothyroidism: Secondary | ICD-10-CM

## 2014-05-16 DIAGNOSIS — Z79899 Other long term (current) drug therapy: Secondary | ICD-10-CM | POA: Diagnosis not present

## 2014-05-16 DIAGNOSIS — E876 Hypokalemia: Secondary | ICD-10-CM | POA: Diagnosis present

## 2014-05-16 DIAGNOSIS — J9620 Acute and chronic respiratory failure, unspecified whether with hypoxia or hypercapnia: Secondary | ICD-10-CM | POA: Diagnosis present

## 2014-05-16 DIAGNOSIS — K219 Gastro-esophageal reflux disease without esophagitis: Secondary | ICD-10-CM | POA: Diagnosis present

## 2014-05-16 LAB — MRSA PCR SCREENING: MRSA BY PCR: NEGATIVE

## 2014-05-16 MED ORDER — CETYLPYRIDINIUM CHLORIDE 0.05 % MT LIQD
7.0000 mL | Freq: Two times a day (BID) | OROMUCOSAL | Status: DC
  Administered 2014-05-16 – 2014-05-19 (×6): 7 mL via OROMUCOSAL

## 2014-05-16 MED ORDER — METHYLPREDNISOLONE SODIUM SUCC 125 MG IJ SOLR
60.0000 mg | Freq: Four times a day (QID) | INTRAMUSCULAR | Status: DC
  Administered 2014-05-16 (×2): 60 mg via INTRAVENOUS
  Filled 2014-05-16 (×6): qty 0.96

## 2014-05-16 MED ORDER — IPRATROPIUM BROMIDE 0.02 % IN SOLN: 0.5000 mg | Freq: Four times a day (QID) | RESPIRATORY_TRACT | Status: DC

## 2014-05-16 MED ORDER — ALBUTEROL SULFATE (2.5 MG/3ML) 0.083% IN NEBU: 2.5000 mg | INHALATION_SOLUTION | Freq: Four times a day (QID) | RESPIRATORY_TRACT | Status: DC

## 2014-05-16 MED ORDER — SIMVASTATIN 5 MG PO TABS
5.0000 mg | ORAL_TABLET | Freq: Every day | ORAL | Status: DC
  Administered 2014-05-16 – 2014-05-18 (×3): 5 mg via ORAL
  Filled 2014-05-16 (×4): qty 1

## 2014-05-16 MED ORDER — ONDANSETRON HCL 4 MG PO TABS: 4.0000 mg | ORAL_TABLET | Freq: Four times a day (QID) | ORAL | Status: DC | PRN

## 2014-05-16 MED ORDER — METHYLPREDNISOLONE SODIUM SUCC 125 MG IJ SOLR
60.0000 mg | Freq: Three times a day (TID) | INTRAMUSCULAR | Status: DC
  Administered 2014-05-16 – 2014-05-17 (×3): 60 mg via INTRAVENOUS
  Filled 2014-05-16 (×6): qty 0.96

## 2014-05-16 MED ORDER — LEVOFLOXACIN IN D5W 500 MG/100ML IV SOLN
500.0000 mg | INTRAVENOUS | Status: DC
  Administered 2014-05-16: 500 mg via INTRAVENOUS
  Filled 2014-05-16 (×2): qty 100

## 2014-05-16 MED ORDER — LEVOFLOXACIN IN D5W 750 MG/150ML IV SOLN: 750.0000 mg | INTRAVENOUS | Status: DC

## 2014-05-16 MED ORDER — ENOXAPARIN SODIUM 30 MG/0.3ML ~~LOC~~ SOLN
30.0000 mg | SUBCUTANEOUS | Status: DC
  Administered 2014-05-16 – 2014-05-19 (×4): 30 mg via SUBCUTANEOUS
  Filled 2014-05-16 (×5): qty 0.3

## 2014-05-16 MED ORDER — METOPROLOL TARTRATE 50 MG PO TABS
50.0000 mg | ORAL_TABLET | Freq: Two times a day (BID) | ORAL | Status: DC
  Administered 2014-05-16 – 2014-05-19 (×8): 50 mg via ORAL
  Filled 2014-05-16 (×9): qty 1

## 2014-05-16 MED ORDER — ACETAMINOPHEN 325 MG PO TABS: 650.0000 mg | ORAL_TABLET | Freq: Four times a day (QID) | ORAL | Status: DC | PRN

## 2014-05-16 MED ORDER — PANTOPRAZOLE SODIUM 40 MG PO TBEC
40.0000 mg | DELAYED_RELEASE_TABLET | Freq: Every day | ORAL | Status: DC
  Administered 2014-05-16 – 2014-05-19 (×4): 40 mg via ORAL
  Filled 2014-05-16 (×4): qty 1

## 2014-05-16 MED ORDER — SODIUM CHLORIDE 0.9 % IJ SOLN
3.0000 mL | Freq: Two times a day (BID) | INTRAMUSCULAR | Status: DC
  Administered 2014-05-16 – 2014-05-19 (×8): 3 mL via INTRAVENOUS

## 2014-05-16 MED ORDER — HYDROCODONE-ACETAMINOPHEN 5-325 MG PO TABS
1.0000 | ORAL_TABLET | ORAL | Status: DC | PRN
  Administered 2014-05-18: 1 via ORAL
  Filled 2014-05-16: qty 1

## 2014-05-16 MED ORDER — GUAIFENESIN ER 600 MG PO TB12
1200.0000 mg | ORAL_TABLET | Freq: Two times a day (BID) | ORAL | Status: DC
  Administered 2014-05-16 – 2014-05-17 (×5): 1200 mg via ORAL
  Filled 2014-05-16 (×8): qty 2

## 2014-05-16 MED ORDER — ZOLPIDEM TARTRATE 5 MG PO TABS: 5.0000 mg | ORAL_TABLET | Freq: Every evening | ORAL | Status: DC | PRN

## 2014-05-16 MED ORDER — IPRATROPIUM-ALBUTEROL 0.5-2.5 (3) MG/3ML IN SOLN
3.0000 mL | Freq: Four times a day (QID) | RESPIRATORY_TRACT | Status: DC
  Administered 2014-05-16 – 2014-05-18 (×10): 3 mL via RESPIRATORY_TRACT
  Filled 2014-05-16 (×10): qty 3

## 2014-05-16 MED ORDER — AMLODIPINE BESYLATE 10 MG PO TABS
10.0000 mg | ORAL_TABLET | Freq: Every day | ORAL | Status: DC
  Administered 2014-05-16 – 2014-05-19 (×4): 10 mg via ORAL
  Filled 2014-05-16 (×4): qty 1

## 2014-05-16 MED ORDER — VITAMIN D (ERGOCALCIFEROL) 1.25 MG (50000 UNIT) PO CAPS
50000.0000 [IU] | ORAL_CAPSULE | ORAL | Status: DC
  Administered 2014-05-16: 50000 [IU] via ORAL
  Filled 2014-05-16: qty 1

## 2014-05-16 MED ORDER — ASPIRIN 325 MG PO TABS
325.0000 mg | ORAL_TABLET | Freq: Four times a day (QID) | ORAL | Status: DC | PRN
  Filled 2014-05-16: qty 1

## 2014-05-16 MED ORDER — ENOXAPARIN SODIUM 40 MG/0.4ML ~~LOC~~ SOLN
40.0000 mg | SUBCUTANEOUS | Status: DC
  Filled 2014-05-16: qty 0.4

## 2014-05-16 MED ORDER — ACETAMINOPHEN 650 MG RE SUPP: 650.0000 mg | Freq: Four times a day (QID) | RECTAL | Status: DC | PRN

## 2014-05-16 MED ORDER — LEVOTHYROXINE SODIUM 125 MCG PO TABS
125.0000 ug | ORAL_TABLET | Freq: Every day | ORAL | Status: DC
  Administered 2014-05-16 – 2014-05-19 (×4): 125 ug via ORAL
  Filled 2014-05-16 (×5): qty 1

## 2014-05-16 MED ORDER — ONDANSETRON HCL 4 MG/2ML IJ SOLN: 4.0000 mg | Freq: Four times a day (QID) | INTRAMUSCULAR | Status: DC | PRN

## 2014-05-16 MED ORDER — POTASSIUM CHLORIDE CRYS ER 20 MEQ PO TBCR
20.0000 meq | EXTENDED_RELEASE_TABLET | Freq: Every day | ORAL | Status: DC
  Administered 2014-05-16 – 2014-05-19 (×4): 20 meq via ORAL
  Filled 2014-05-16 (×4): qty 1

## 2014-05-16 MED ORDER — ALBUTEROL SULFATE (2.5 MG/3ML) 0.083% IN NEBU
2.5000 mg | INHALATION_SOLUTION | RESPIRATORY_TRACT | Status: DC | PRN
  Administered 2014-05-19: 2.5 mg via RESPIRATORY_TRACT
  Filled 2014-05-16 (×2): qty 3

## 2014-05-16 MED ORDER — SODIUM CHLORIDE 0.9 % IJ SOLN: 3.0000 mL | INTRAMUSCULAR | Status: DC | PRN

## 2014-05-16 MED ORDER — SODIUM CHLORIDE 0.9 % IV SOLN: 250.0000 mL | INTRAVENOUS | Status: DC | PRN

## 2014-05-16 NOTE — Progress Notes (Signed)
Utilization Review Completed.   Ingris Pasquarella, RN, BSN Nurse Case Manager  

## 2014-05-16 NOTE — Progress Notes (Signed)
ANTIBIOTIC CONSULT NOTE - INITIAL  Pharmacy Consult for Levofloxacin Indication: Possible PNA  No Known Allergies  Patient Measurements: Height: 5\' 4"  (162.6 cm) Weight: 126 lb 12.8 oz (57.516 kg) IBW/kg (Calculated) : 54.7  Vital Signs: Temp: 98.5 F (36.9 C) (10/04 1406) Temp Source: Oral (10/04 1406) BP: 125/54 mmHg (10/04 1406) Pulse Rate: 82 (10/04 1406) Intake/Output from previous day:   Intake/Output from this shift:    Labs:  Recent Labs  05/15/14 2119  WBC 7.6  HGB 15.5*  PLT 180  CREATININE 0.66   Estimated Creatinine Clearance: 46 ml/min (by C-G formula based on Cr of 0.66). No results found for this basename: VANCOTROUGH, Leodis BinetVANCOPEAK, VANCORANDOM, GENTTROUGH, GENTPEAK, GENTRANDOM, TOBRATROUGH, TOBRAPEAK, TOBRARND, AMIKACINPEAK, AMIKACINTROU, AMIKACIN,  in the last 72 hours   Microbiology: Recent Results (from the past 720 hour(s))  MRSA PCR SCREENING     Status: None   Collection Time    05/16/14  1:27 AM      Result Value Ref Range Status   MRSA by PCR NEGATIVE  NEGATIVE Final   Comment:            The GeneXpert MRSA Assay (FDA     approved for NASAL specimens     only), is one component of a     comprehensive MRSA colonization     surveillance program. It is not     intended to diagnose MRSA     infection nor to guide or     monitor treatment for     MRSA infections.    Medical History: Past Medical History  Diagnosis Date  . Hypertension   . Hyperlipidemia   . Thyroid disease   . Hypokalemia   . Vitamin D deficiency   . COPD (chronic obstructive pulmonary disease)    Assessment: 80 yo F presents on 10/3 with SOB and cough. H/o one week of whitish productive cough and pleuritic chest pain. On PE, had rhonchi and rales and started on IV Levaquin and steroids. CXR is unremarkable. Possible PNA vs. COPD exacerbation. Pt has been afebrile and WBC wnl. SCr 0.66, CrCl ~7240ml/min  Goal of Therapy:  Eradication of infection Improvement in  symptoms  Plan:  Levaquin 750mg  IV Q48H Monitor clinical picture, renal function, vital signs, CBC  Shareef Eddinger J 05/16/2014,2:43 PM

## 2014-05-16 NOTE — Progress Notes (Addendum)
PATIENT DETAILS Name: [name] [name] Age: 80 y.o. Sex: female Date of Birth: 15-Apr-1929 Admit Date: 05/15/2014 Admitting Physician Carron Curie, MD PCP:No primary provider on file.  Subjective: Feels better. Admitted on 10/3 with cough, and shortness of breath. Resident of SNF ([name])  Assessment/Plan: Active Problems:   COPD exacerbation - Admitted with cough and shortness of breath, reviewed H&P and ED notes, had rhonchi and rales on admission. Admitted and started on IV Solu-Medrol, IV Levaquin and scheduled nebulized bronchodilators. Claims to have improved this morning, moving AIr, only a few scattered rhonchi. Will decrease Solu-Medrol to 60 mg 3 times a day, continue with Levaquin, nebulized bronchodilators. Add incentive spirometry, flutter valve, PT evaluation.  Acute on chronic hypoxic respiratory failure - Claims to be on oxygen 2 L/minute as outpatient, slowly titrate oxygen back to usual. Has some mild lower extremity edema, will check 2-D echocardiogram, however does not appear to have overtly excess volume.   Hypertension - Controlled- Continue with metoprolol and amlodipine. Since bronchospasm significantly improved, will continue with metoprolol, if becomes an issue in the future-may need to be switched over to bisoprolol  Dyslipidemia - Continue with statin  Hypothyroidism - Continue with levothyroxine  Disposition: Remain inpatient  DVT Prophylaxis: Prophylactic Lovenox   Code Status:  DNR-confirmed patient-and also with grandson [name]  Family Communication [name]-Grandson-[phone]-spoke over the phone  Procedures:  None  CONSULTS:  None  Time spent 40 minutes-which includes 50% of the time with face-to-face with patient/ family and coordinating care related to the above assessment and plan.  MEDICATIONS: Scheduled Meds: . amLODipine  10 mg Oral Daily  . antiseptic oral rinse  7 mL Mouth Rinse BID  .  enoxaparin (LOVENOX) injection  30 mg Subcutaneous Q24H  . guaiFENesin  1,200 mg Oral BID  . ipratropium-albuterol  3 mL Nebulization Q6H  . levofloxacin (LEVAQUIN) IV  500 mg Intravenous Q24H  . levothyroxine  125 mcg Oral QAC breakfast  . methylPREDNISolone (SOLU-MEDROL) injection  60 mg Intravenous Q6H  . metoprolol  50 mg Oral BID  . pantoprazole  40 mg Oral Daily  . potassium chloride SA  20 mEq Oral Daily  . simvastatin  5 mg Oral q1800  . sodium chloride  3 mL Intravenous Q12H  . Vitamin D (Ergocalciferol)  50,000 Units Oral Q Sun   Continuous Infusions:  PRN Meds:.sodium chloride, acetaminophen, acetaminophen, albuterol, aspirin, HYDROcodone-acetaminophen, ondansetron (ZOFRAN) IV, ondansetron, sodium chloride, zolpidem  Antibiotics: Anti-infectives   Start     Dose/Rate Route Frequency Ordered Stop   05/16/14 0145  levofloxacin (LEVAQUIN) IVPB 500 mg    Comments:  Bronchitis   500 mg 100 mL/hr over 60 Minutes Intravenous Every 24 hours 05/16/14 0127         PHYSICAL EXAM: Vital signs in last 24 hours: Filed Vitals:   05/16/14 0030 05/16/14 0120 05/16/14 0214 05/16/14 0537  BP: 150/68 158/73  119/60  Pulse: 89 96  67  Temp:  98.4 F (36.9 C)  98.4 F (36.9 C)  TempSrc:  Oral  Oral  Resp: 24 18  18   Height:  5\' 4"  (1.626 m)    Weight:  57.516 kg (126 lb 12.8 oz)    SpO2: 100% 95% 96% 99%    Weight change:  Filed Weights   05/15/14 2110 05/16/14 0120  Weight: 55.792 kg (123 lb) 57.516 kg (126 lb 12.8 oz)   Body mass index is 21.75 kg/(m^2).   Gen Exam:  Awake and alert with clear speech. Not in distress Neck: Supple, No JVD.   Chest: Good air entry bilaterally, scattered rhonchi CVS: S1 S2 Regular, no murmurs.  Abdomen: soft, BS +, non tender, non distended.  Extremities: no edema, lower extremities warm to touch. Neurologic: Non Focal.   Skin: No Rash.   Wounds: N/A.    Intake/Output from previous day: No intake or output data in the 24 hours ending  05/16/14 0941   LAB RESULTS: CBC  Recent Labs Lab 05/15/14 2119  WBC 7.6  HGB 15.5*  HCT 46.9*  PLT 180  MCV 90.0  MCH 29.8  MCHC 33.0  RDW 14.0  LYMPHSABS 1.4  MONOABS 0.7  EOSABS 0.2  BASOSABS 0.0    Chemistries   Recent Labs Lab 05/15/14 2119  NA 142  K 3.6*  CL 100  CO2 31  GLUCOSE 172*  BUN 18  CREATININE 0.66  CALCIUM 9.7    CBG: No results found for this basename: GLUCAP,  in the last 168 hours  GFR Estimated Creatinine Clearance: 46 ml/min (by C-G formula based on Cr of 0.66).  Coagulation profile No results found for this basename: INR, PROTIME,  in the last 168 hours  Cardiac Enzymes  Recent Labs Lab 05/15/14 2119  TROPONINI <0.30    No components found with this basename: POCBNP,  No results found for this basename: DDIMER,  in the last 72 hours No results found for this basename: HGBA1C,  in the last 72 hours No results found for this basename: CHOL, HDL, LDLCALC, TRIG, CHOLHDL, LDLDIRECT,  in the last 72 hours No results found for this basename: TSH, T4TOTAL, FREET3, T3FREE, THYROIDAB,  in the last 72 hours No results found for this basename: VITAMINB12, FOLATE, FERRITIN, TIBC, IRON, RETICCTPCT,  in the last 72 hours No results found for this basename: LIPASE, AMYLASE,  in the last 72 hours  Urine Studies No results found for this basename: UACOL, UAPR, USPG, UPH, UTP, UGL, UKET, UBIL, UHGB, UNIT, UROB, ULEU, UEPI, UWBC, URBC, UBAC, CAST, CRYS, UCOM, BILUA,  in the last 72 hours  MICROBIOLOGY: Recent Results (from the past 240 hour(s))  MRSA PCR SCREENING     Status: None   Collection Time    05/16/14  1:27 AM      Result Value Ref Range Status   MRSA by PCR NEGATIVE  NEGATIVE Final   Comment:            The GeneXpert MRSA Assay (FDA     approved for NASAL specimens     only), is one component of a     comprehensive MRSA colonization     surveillance program. It is not     intended to diagnose MRSA     infection nor to guide  or     monitor treatment for     MRSA infections.    RADIOLOGY STUDIES/RESULTS: Dg Chest 2 View  05/15/2014   CLINICAL DATA:  Shortness of breath.  EXAM: CHEST  2 VIEW  COMPARISON:  01/28/2014  FINDINGS: Emphysematous changes in the lungs. The heart size and mediastinal contours are within normal limits. Both lungs are clear. The visualized skeletal structures are unremarkable.  IMPRESSION: No active cardiopulmonary disease.   Electronically Signed   By: Burman NievesWilliam  Stevens M.D.   On: 05/15/2014 22:34    Jeoffrey MassedGHIMIRE,SHANKER, MD  [facility] Pager:[phone]  If 7PM-7AM, please contact night-coverage [email or web address] Password [email or web address] 05/16/2014, 9:41 AM   LOS: 1 day   **  Disclaimer: This note may have been dictated with voice recognition software. Similar sounding words can inadvertently be transcribed and this note may contain transcription errors which may not have been corrected upon publication of note.**

## 2014-05-16 NOTE — Progress Notes (Signed)
NURSING PROGRESS NOTE  [name] [name] [id number] Admission Data: 05/16/2014 2:55 AM Attending Provider: Carron CurieAli Hijazi, MD PCP:No primary provider on file. Code Status: Full  [name] [name] is a 80 y.o. female patient admitted from ED:  -No acute distress noted.   -No complaints of chest pain.   Cardiac Monitoring: Box # 6  Placed  Blood pressure 158/73, pulse 96, temperature 98.4 F (36.9 C), temperature source Oral, resp. rate 18, height 5\' 4"  (1.626 m), weight 57.516 kg (126 lb 12.8 oz), SpO2 96.00%.   IV Fluids:  IV in place, occlusive dsg intact without redness, IV cath  Rt AC Allergies:  Review of patient's allergies indicates no known allergies.  Past Medical History:   has a past medical history of Hypertension; Hyperlipidemia; Thyroid disease; Hypokalemia; Vitamin D deficiency; and COPD (chronic obstructive pulmonary disease).  Past Surgical History:   has no past surgical history on file.  Social History:   reports that she has quit smoking. Her smoking use included Cigarettes. She smoked 0.00 packs per day. She does not have any smokeless tobacco history on file. She reports that she does not drink alcohol or use illicit drugs.  Skin: Intact  Patient/Family orientated to room. Information packet given to patient/family. Admission inpatient armband information verified with patient/family to include name and date of birth and placed on patient arm. Side rails up x 2, fall assessment and education completed with patient/family. Patient/family able to verbalize understanding of risk associated with falls and verbalized understanding to call for assistance before getting out of bed. Call light within reach. Patient/family able to voice and demonstrate understanding of unit orientation instructions.

## 2014-05-16 NOTE — Progress Notes (Signed)
Called to ER to get report on pt to be admitted on room 33. Nurse said she is currently busy, number given to call us back.

## 2014-05-16 NOTE — Evaluation (Signed)
Physical Therapy Evaluation Patient Details Name: [name] AreaMurl [name] MRN: [id number] DOB: 03/09/1929 Today's Date: 05/16/2014   History of Present Illness  [name] [name]  is a 80 y.o. female, nursing home resident with past medical history significant for COPD presenting with one-week history of , whitish productive cough and pleuritic chest pain with hypoxia on admission  Clinical Impression  Pt pleasant with sats dropping to 88% on RA with maintained at 93% on 2L . Pt reports she normally walks on her own but per prior admission walk with RW and no family present to clarify. Pt will benefit from acute therapy to maximize mobility, function, balance and gait to decrease burden of care.     Follow Up Recommendations SNF;Supervision/Assistance - 24 hour    Equipment Recommendations  None recommended by PT    Recommendations for Other Services       Precautions / Restrictions Precautions Precautions: Fall Precaution Comments: watch sats      Mobility  Bed Mobility Overal bed mobility: Needs Assistance Bed Mobility: Supine to Sit     Supine to sit: Min assist     General bed mobility comments: cues for sequence with assist of rail to elevate trunk and need for additional hand held assist. Use of pad and cues for reciprocal scooting to EOB  Transfers Overall transfer level: Needs assistance   Transfers: Sit to/from Stand Sit to Stand: Min assist         General transfer comment: cues for hand placement with assist for anterior translation  Ambulation/Gait Ambulation/Gait assistance: Min assist Ambulation Distance (Feet): 15 Feet (x 2 trials with seated rest between) Assistive device: 1 person hand held assist Gait Pattern/deviations: Shuffle;Trunk flexed;Narrow base of support   Gait velocity interpretation: Below normal speed for age/gender General Gait Details: slow shuffling gait with assist for balance and direction of transitions  Stairs             Wheelchair Mobility    Modified Rankin (Stroke Patients Only)       Balance Overall balance assessment: Needs assistance   Sitting balance-Leahy Scale: Fair       Standing balance-Leahy Scale: Poor                               Pertinent Vitals/Pain Pain Assessment: No/denies pain    Home Living Family/patient expects to be discharged to:: Skilled nursing facility                 Additional Comments: Pt from [name]     Prior Function Level of Independence: Independent with assistive device(s)         Comments: Pt states she uses a RW sometimes and has assist for ADLs      Hand Dominance        Extremity/Trunk Assessment   Upper Extremity Assessment: Generalized weakness           Lower Extremity Assessment: Generalized weakness      Cervical / Trunk Assessment: Kyphotic  Communication   Communication: HOH  Cognition Arousal/Alertness: Awake/alert Behavior During Therapy: Flat affect Overall Cognitive Status: Impaired/Different from baseline [name] of Impairment: Orientation;Memory;Safety/judgement Orientation Level: Disoriented to;Time   Memory: Decreased short-term memory   Safety/Judgement: Decreased awareness of deficits     General Comments: Pt with word finding difficulty at times stating "I need to go to the microwave, I have to use the bathroom".    General Comments  Exercises        Assessment/Plan    PT Assessment Patient needs continued PT services  PT Diagnosis Difficulty walking;Generalized weakness;Altered mental status   PT Problem List Decreased strength;Decreased cognition;Decreased activity tolerance;Decreased balance;Decreased mobility;Decreased knowledge of use of DME;Decreased safety awareness  PT Treatment Interventions DME instruction;Gait training;Functional mobility training;Therapeutic activities;Therapeutic exercise;Patient/family education;Balance training   PT Goals  (Current goals can be found in the Care Plan section) Acute Rehab PT Goals Patient Stated Goal: walk ok PT Goal Formulation: With patient Time For Goal Achievement: 05/30/14 Potential to Achieve Goals: Fair    Frequency Min 2X/week   Barriers to discharge        Co-evaluation               End of Session   Activity Tolerance: Patient tolerated treatment well Patient left: in chair;with call bell/phone within reach;with chair alarm set;with nursing/sitter in room Nurse Communication: Mobility status;Precautions         Time: 1206-1221 PT Time Calculation (min): 15 min   Charges:   PT Evaluation $Initial PT Evaluation Tier I: 1 Procedure PT Treatments $Gait Training: 8-22 mins   PT G CodesDelorse [name] 05/16/2014, 1:38 PM [name] [name], PT [phone]

## 2014-05-16 NOTE — Plan of Care (Signed)
Problem: Phase I Progression Outcomes Goal: O2 sats > or equal 90% or at baseline Outcome: Not Progressing 02 sats 89-90% at 2L; pt takes off 02 at times ; reeducated by RN

## 2014-05-17 DIAGNOSIS — I5032 Chronic diastolic (congestive) heart failure: Secondary | ICD-10-CM | POA: Diagnosis present

## 2014-05-17 LAB — PRO B NATRIURETIC PEPTIDE: Pro B Natriuretic peptide (BNP): 690.8 pg/mL — ABNORMAL HIGH (ref 0–450)

## 2014-05-17 MED ORDER — LEVOFLOXACIN 750 MG PO TABS
750.0000 mg | ORAL_TABLET | ORAL | Status: DC
  Administered 2014-05-17: 750 mg via ORAL
  Filled 2014-05-17 (×2): qty 1

## 2014-05-17 MED ORDER — LISINOPRIL 2.5 MG PO TABS
2.5000 mg | ORAL_TABLET | Freq: Every day | ORAL | Status: DC
  Administered 2014-05-17 – 2014-05-19 (×3): 2.5 mg via ORAL
  Filled 2014-05-17 (×3): qty 1

## 2014-05-17 MED ORDER — WHITE PETROLATUM GEL
Status: AC
  Administered 2014-05-17: 0.2
  Filled 2014-05-17: qty 5

## 2014-05-17 MED ORDER — FUROSEMIDE 10 MG/ML IJ SOLN
20.0000 mg | Freq: Once | INTRAMUSCULAR | Status: AC
  Administered 2014-05-17: 20 mg via INTRAVENOUS
  Filled 2014-05-17: qty 2

## 2014-05-17 MED ORDER — METHYLPREDNISOLONE SODIUM SUCC 40 MG IJ SOLR
40.0000 mg | Freq: Two times a day (BID) | INTRAMUSCULAR | Status: DC
  Administered 2014-05-17 – 2014-05-18 (×2): 40 mg via INTRAVENOUS
  Filled 2014-05-17 (×4): qty 1

## 2014-05-17 NOTE — Clinical Social Work Psychosocial (Signed)
Clinical Social Work Department BRIEF PSYCHOSOCIAL ASSESSMENT 05/17/2014  Patient:  [name] [name],[name] [name]     Account Number:  [id number]     Admit date:  05/15/2014  Clinical Social Worker:  Lavell LusterAMPBELL,Taite Baldassari BRYANT, LCSWA  Date/Time:  05/17/2014 04:00 PM  Referred by:  Physician  Date Referred:  05/17/2014 Referred for  SNF Placement   Other Referral:   NA   Interview type:  Family Other interview type:   Patient's grandson interviewed to complete assessment.    PSYCHOSOCIAL DATA Living Status:  FACILITY [facility] Level of care:  Skilled Nursing Facility Primary support name:  [name] CoombsDon Primary support relationship to patient:  FAMILY Degree of support available:   Patient has good support.    CURRENT CONCERNS Current Concerns  Post-Acute Placement   Other Concerns:   NA    SOCIAL WORK ASSESSMENT / PLAN CSW spoke with patient's grandson [name] [name] by phone to complete assessment as no family currently at bedside and patient not fully oriented at this time. [name] CoombsDon states that the patient has been in long term SNF for a few years. [name]. [name] CoombsDon states that he is much happier with the current facility and plans for the patient to return at discharge. [name] CoombsDon is clearly supportive of the patient and wants what is best for her. [name] CoombsDon was calm and engaged in assessment. CSW will assist.   Assessment/plan status:  Psychosocial Support/Ongoing Assessment of Needs Other assessment/ plan:   Complete Fl2, Fax, PASRR   Information/referral to community resources:   CSW contact information given.    PATIENT'S/FAMILY'S RESPONSE TO PLAN OF CARE: Patient's grandson plans for the patient to discharge back to [name] once medically stable. CSW will assist with DC.       [name] [name] MSW, [place], [place], [phone]

## 2014-05-17 NOTE — Progress Notes (Signed)
PATIENT DETAILS Name: [name] [name] Age: 80 y.o. Sex: female Date of Birth: 05/13/1929 Admit Date: 05/15/2014 Admitting Physician Carron Curie, MD PCP:No primary provider on file.  Subjective: Patient continues to feel better. Still some cough, occasionally productive of whitish sputum. Fatigued. Ambulating somewhat with a walker  Assessment/Plan: Active Problems:   COPD exacerbation - Admitted with cough and shortness of breath, reviewed H&P and ED notes, had rhonchi and rales on admission. Admitted and started on IV Solu-Medrol, IV Levaquin and scheduled nebulized bronchodilators. Good airway sounds, no wheezing, mostly clear. Further decrease Solu-Medrol, continue with Levaquin, nebulized bronchodilators. Add incentive spirometry, flutter valve,   Acute on chronic hypoxic respiratory failure - Claims to be on oxygen 2 L/minute as outpatient, slowly titrate oxygen back to usual. Has some mild lower extremity edema, will check 2-D echocardiogram, however does not appear to have overtly excess volume.  Chronic diastolic heart failure: 2-D echo done noted grade 1 diastolic heart failure. Check BNP. Patient does not look severely volume overloaded. Added low-dose lisinopril. We'll wait till COPD better stabilized to consider beta blocker.   Hypertension - Controlled- Continue with metoprolol and amlodipine. Since bronchospasm significantly improved, will continue with metoprolol, if becomes an issue in the future-may need to be switched over to bisoprolol  Dyslipidemia - Continue with statin  Hypothyroidism - Continue with levothyroxine  Disposition: Back to skilled nursing tomorrow  DVT Prophylaxis: Prophylactic Lovenox   Code Status:  DNR-confirmed patient-and also with grandson [name] [name]  Family Communication [name] [name]-[phone]-updated by phone 10/5  Procedures:  2-D echo done 10/4: Grade 1 diastolic dysfunction  CONSULTS:  None  Time  spent 25 minutes  MEDICATIONS: Scheduled Meds: . amLODipine  10 mg Oral Daily  . antiseptic oral rinse  7 mL Mouth Rinse BID  . enoxaparin (LOVENOX) injection  30 mg Subcutaneous Q24H  . guaiFENesin  1,200 mg Oral BID  . ipratropium-albuterol  3 mL Nebulization Q6H  . levofloxacin  750 mg Oral Q48H  . levothyroxine  125 mcg Oral QAC breakfast  . methylPREDNISolone (SOLU-MEDROL) injection  40 mg Intravenous Q12H  . metoprolol  50 mg Oral BID  . pantoprazole  40 mg Oral Daily  . potassium chloride SA  20 mEq Oral Daily  . simvastatin  5 mg Oral q1800  . sodium chloride  3 mL Intravenous Q12H  . Vitamin D (Ergocalciferol)  50,000 Units Oral Q Sun   Continuous Infusions:  PRN Meds:.sodium chloride, acetaminophen, acetaminophen, albuterol, aspirin, HYDROcodone-acetaminophen, ondansetron (ZOFRAN) IV, ondansetron, sodium chloride, zolpidem  Antibiotics: Anti-infectives   Start     Dose/Rate Route Frequency Ordered Stop   05/18/14 0206  levofloxacin (LEVAQUIN) IVPB 750 mg  Status:  Discontinued    Comments:  Bronchitis   750 mg 100 mL/hr over 90 Minutes Intravenous Every 48 hours 05/16/14 1434 05/17/14 1036   05/17/14 1600  levofloxacin (LEVAQUIN) tablet 750 mg     750 mg Oral Every 48 hours 05/17/14 1036     05/16/14 0145  levofloxacin (LEVAQUIN) IVPB 500 mg  Status:  Discontinued    Comments:  Bronchitis   500 mg 100 mL/hr over 60 Minutes Intravenous Every 24 hours 05/16/14 0127 05/16/14 1434       PHYSICAL EXAM: Vital signs in last 24 hours: Filed Vitals:   05/17/14 0522 05/17/14 0750 05/17/14 0907 05/17/14 1341  BP: 125/60   125/71  Pulse: 72   104  Temp: 97.9 F (36.6 C)   98.2  F (36.8 C)  TempSrc: Oral   Oral  Resp: 16   20  Height:      Weight:   60.419 kg (133 lb 3.2 oz)   SpO2: 98% 97%  95%    Weight change:  Filed Weights   05/15/14 2110 05/16/14 0120 05/17/14 0907  Weight: 55.792 kg (123 lb) 57.516 kg (126 lb 12.8 oz) 60.419 kg (133 lb 3.2 oz)   Body  mass index is 22.85 kg/(m^2).   Gen Exam: Fatigued, alert and oriented x3 Chest: Decreased breath sounds throughout, no wheezes, good airway exchange CVS: S1 S2 Regular, no murmurs.  Abdomen: soft, BS +, non tender, non distended.  Extremities: no edema, no clubbing or cyanosis  Intake/Output from previous day:  Intake/Output Summary (Last 24 hours) at 05/17/14 1415 Last data filed at 05/17/14 0900  Gross per 24 hour  Intake    320 ml  Output      0 ml  Net    320 ml     LAB RESULTS: CBC  Recent Labs Lab 05/15/14 2119  WBC 7.6  HGB 15.5*  HCT 46.9*  PLT 180  MCV 90.0  MCH 29.8  MCHC 33.0  RDW 14.0  LYMPHSABS 1.4  MONOABS 0.7  EOSABS 0.2  BASOSABS 0.0    Chemistries   Recent Labs Lab 05/15/14 2119  NA 142  K 3.6*  CL 100  CO2 31  GLUCOSE 172*  BUN 18  CREATININE 0.66  CALCIUM 9.7     Cardiac Enzymes  Recent Labs Lab 05/15/14 2119  TROPONINI <0.30    MICROBIOLOGY: Recent Results (from the past 240 hour(s))  MRSA PCR SCREENING     Status: None   Collection Time    05/16/14  1:27 AM      Result Value Ref Range Status   MRSA by PCR NEGATIVE  NEGATIVE Final   Comment:            The GeneXpert MRSA Assay (FDA     approved for NASAL specimens     only), is one component of a     comprehensive MRSA colonization     surveillance program. It is not     intended to diagnose MRSA     infection nor to guide or     monitor treatment for     MRSA infections.    RADIOLOGY STUDIES/RESULTS: Dg Chest 2 View  05/15/2014   CLINICAL DATA:  Shortness of breath.  EXAM: CHEST  2 VIEW  COMPARISON:  01/28/2014  FINDINGS: Emphysematous changes in the lungs. The heart size and mediastinal contours are within normal limits. Both lungs are clear. The visualized skeletal structures are unremarkable.  IMPRESSION: No active cardiopulmonary disease.   Electronically Signed   By: Burman NievesWilliam  Stevens M.D.   On: 05/15/2014 22:34    Hollice EspyKRISHNAN,Gricelda Foland K, MD  [facility] Pager:[phone]  If 7PM-7AM, please contact night-coverage [email or web address] Password [email or web address] 05/17/2014, 2:15 PM   LOS: 2 days

## 2014-05-18 DIAGNOSIS — K219 Gastro-esophageal reflux disease without esophagitis: Secondary | ICD-10-CM | POA: Diagnosis present

## 2014-05-18 MED ORDER — LEVOFLOXACIN 750 MG PO TABS: ORAL_TABLET | ORAL | Status: DC

## 2014-05-18 MED ORDER — PREDNISONE 20 MG PO TABS
20.0000 mg | ORAL_TABLET | Freq: Every day | ORAL | Status: AC
  Administered 2014-05-19: 20 mg via ORAL
  Filled 2014-05-18: qty 1

## 2014-05-18 MED ORDER — LISINOPRIL 2.5 MG PO TABS: 2.5000 mg | ORAL_TABLET | Freq: Every day | ORAL | Status: DC

## 2014-05-18 MED ORDER — PREDNISONE 20 MG PO TABS
40.0000 mg | ORAL_TABLET | Freq: Once | ORAL | Status: AC
  Administered 2014-05-18: 40 mg via ORAL
  Filled 2014-05-18: qty 2

## 2014-05-18 MED ORDER — GUAIFENESIN 100 MG/5ML PO SYRP
600.0000 mg | ORAL_SOLUTION | Freq: Four times a day (QID) | ORAL | Status: DC
  Administered 2014-05-18 – 2014-05-19 (×6): 600 mg via ORAL
  Filled 2014-05-18 (×9): qty 30

## 2014-05-18 MED ORDER — IPRATROPIUM-ALBUTEROL 0.5-2.5 (3) MG/3ML IN SOLN
3.0000 mL | Freq: Four times a day (QID) | RESPIRATORY_TRACT | Status: DC
  Administered 2014-05-18 – 2014-05-19 (×3): 3 mL via RESPIRATORY_TRACT
  Filled 2014-05-18 (×4): qty 3

## 2014-05-18 NOTE — Progress Notes (Signed)
O2 saturation  92% in chair on O2 96% standing with O2 91% standing without O2 89% ambulating without O2

## 2014-05-18 NOTE — Discharge Summary (Addendum)
Discharge Summary  [facility] JYN:[id number] DOB: 1928/10/09  PCP: Sharee Holster., NP  Admit date: 05/15/2014 Anticipated Discharge date: 05/19/2014  Time spent: 25 minutes  Recommendations for Outpatient Follow-up:  1. New medication: Levaquin 750 mg one dose on 10/7 in the evening and one dose on 10/9   Discharge Diagnoses:  Active Hospital Problems   Diagnosis Date Noted  . GERD (gastroesophageal reflux disease) 05/18/2014  . Chronic diastolic heart failure 05/17/2014  . COPD exacerbation 05/15/2014  . Hypothyroidism 11/05/2012  . Essential hypertension, benign 11/05/2012  . Dyslipidemia 11/05/2012  . Acute respiratory failure with hypoxia 03/03/2012    Resolved Hospital Problems   Diagnosis Date Noted Date Resolved  No resolved problems to display.    Discharge Condition: improved, being discharged back to skilled nursing facility  Diet recommendation: low sodium  Filed Weights   05/17/14 0907 05/18/14 0528 05/19/14 0532  Weight: 60.419 kg (133 lb 3.2 oz) 61 kg (134 lb 7.7 oz) 61 kg (134 lb 7.7 oz)    History of present illness:  80 year old white female past medical history of chronic COPD and diastolic heart failure on chronic oxygen presented to the emergency room on 10/3 from her skilled nursing facility with complaints of several days of shortness of breath and productive cough.her chest x-ray was unrevealing and she was admitted to the hospital service for COPD exacerbation  Hospital Course:  Active Problems:   Acute respiratory failure with hypoxia: Secondary COPD exacerbation. At baseline by discharge.    Dyslipidemia: Continue on statin.    Essential hypertension, benign: Continue home meds.    Hypothyroidism: Stable continue on Synthroid    COPD exacerbation: Patient put on steroids, nebs, oxygen and antibiotics. After several days, patient's oxygenation became closer to baseline. Steroids were weaned down andwhen ambulating, she kept her  saturations above 90% on 2 L-baseline O2. She was felt to be stable for discharge back to skilled nursing facility on 10/7.    Chronic diastolic heart failure: BNP checked and found to be unremarkable.echocardiogram done 10/4 noted grade 1 diastolic dysfunction, already on beta blocker. Low-dose lisinopril added    GERD (gastroesophageal reflux disease): Continued on PPI   Procedures:  2-D echo done 10/4: Grade 1 diastolic dysfunction  Consultations:  none  Discharge Exam: BP 131/51  Pulse 66  Temp(Src) 97.6 F (36.4 C) (Oral)  Resp 20  Ht 5\' 4"  (1.626 m)  Wt 61 kg (134 lb 7.7 oz)  BMI 23.07 kg/m2  SpO2 94%  General: elderly, frail, alert and oriented x3, no acute distress on 4L of oxygen Cardiovascular: regular rate and rhythm, S1-S2 Respiratory: clear auscultation bilaterally-decreased breath sounds throughout Abdomen:  Soft, nt, nd, +bs, no masses.  Extremities:  Minimal lower extremity edema.  Able to move all 4 ext.  Discharge Instructions      Discharge Instructions   Diet - low sodium heart healthy    Complete by:  As directed      Increase activity slowly    Complete by:  As directed             Medication List         albuterol (2.5 MG/3ML) 0.083% nebulizer solution  Commonly known as:  PROVENTIL  Take 2.5 mg by nebulization 4 (four) times daily.     amLODipine 10 MG tablet  Commonly known as:  NORVASC  Take 10 mg by mouth daily.     aspirin 325 MG tablet  Take 325 mg by mouth every 6 (  six) hours as needed for moderate pain.     dextromethorphan 7.5 MG/5ML Syrp  Take 7.5 mg by mouth every 6 (six) hours as needed.     ipratropium 0.02 % nebulizer solution  Commonly known as:  ATROVENT  Take 0.5 mg by nebulization 4 (four) times daily.     levofloxacin 750 MG tablet  Commonly known as:  LEVAQUIN  One dose on 10/7 & 1 dose on 10/9     levothyroxine 125 MCG tablet  Commonly known as:  SYNTHROID, LEVOTHROID  Take 125 mcg by mouth daily  before breakfast.     lisinopril 2.5 MG tablet  Commonly known as:  PRINIVIL,ZESTRIL  Take 1 tablet (2.5 mg total) by mouth daily.     metoprolol 50 MG tablet  Commonly known as:  LOPRESSOR  Take 50 mg by mouth 2 (two) times daily.     omeprazole 20 MG capsule  Commonly known as:  PRILOSEC  Take 20 mg by mouth daily.     potassium chloride SA 20 MEQ tablet  Commonly known as:  K-DUR,KLOR-CON  Take 20 mEq by mouth daily.     pravastatin 40 MG tablet  Commonly known as:  PRAVACHOL  Take 40 mg by mouth at bedtime.     Vitamin D (Ergocalciferol) 50000 UNITS Caps capsule  Commonly known as:  DRISDOL  Take 50,000 Units by mouth every Sunday. Take on Sundays       No Known Allergies    The results of significant diagnostics from this hospitalization (including imaging, microbiology, ancillary and laboratory) are listed below for reference.    Significant Diagnostic Studies: Dg Chest 2 View  05/15/2014   CLINICAL DATA:  Shortness of breath.  EXAM: CHEST  2 VIEW  COMPARISON:  01/28/2014  FINDINGS: Emphysematous changes in the lungs. The heart size and mediastinal contours are within normal limits. Both lungs are clear. The visualized skeletal structures are unremarkable.  IMPRESSION: No active cardiopulmonary disease.   Electronically Signed   By: Burman Nieves M.D.   On: 05/15/2014 22:34    Microbiology: Recent Results (from the past 240 hour(s))  MRSA PCR SCREENING     Status: None   Collection Time    05/16/14  1:27 AM      Result Value Ref Range Status   MRSA by PCR NEGATIVE  NEGATIVE Final   Comment:            The GeneXpert MRSA Assay (FDA     approved for NASAL specimens     only), is one component of a     comprehensive MRSA colonization     surveillance program. It is not     intended to diagnose MRSA     infection nor to guide or     monitor treatment for     MRSA infections.     Labs: Basic Metabolic Panel:  Recent Labs Lab 05/15/14 2119  NA 142   K 3.6*  CL 100  CO2 31  GLUCOSE 172*  BUN 18  CREATININE 0.66  CALCIUM 9.7   Liver Function Tests:  Recent Labs Lab 05/15/14 2119  AST 15  ALT 9  ALKPHOS 85  BILITOT <0.2*  PROT 6.8  ALBUMIN 3.3*   CBC:  Recent Labs Lab 05/15/14 2119  WBC 7.6  NEUTROABS 5.3  HGB 15.5*  HCT 46.9*  MCV 90.0  PLT 180   Cardiac Enzymes:  Recent Labs Lab 05/15/14 2119  TROPONINI <0.30   BNP: BNP (last  3 results)  Recent Labs  01/28/14 1132 05/15/14 2119 05/17/14 1446  PROBNP 785.1* 892.3* 690.8*       Signed:  Algis DownsMarianne York, PA-C [facility] 05/19/2014, 10:50 AM

## 2014-05-18 NOTE — Progress Notes (Addendum)
CARE MANAGEMENT NOTE 05/18/2014  Patient:  [name] [name],[name] [name]   Account Number:  [id number]  Date Initiated:  05/18/2014  Documentation initiated by:  [name] [name]  Subjective/Objective Assessment:   COPD exacerbation     Action/Plan:   SNF   Anticipated DC Date:  05/18/2014   Anticipated DC Plan:  SKILLED NURSING FACILITY  In-house referral  Clinical Social Worker      DC Planning Services  CM consult      Choice offered to / List presented to:             Status of service:  Completed, signed off Medicare Important Message given?  YES (If response is "NO", the following Medicare IM given date fields will be blank) Date Medicare IM given:  05/18/2014 Medicare IM given by:  [facility]Davion [name] Date Additional Medicare IM given:   Additional Medicare IM given by:    Discharge Disposition:  SKILLED NURSING FACILITY  Per UR Regulation:  Reviewed for med. necessity/level of care/duration of stay  If discussed at Long Length of Stay Meetings, dates discussed:    Comments:  05/18/2014 1025 NCM spoke to pt and states her son, [name] [name] [phone]  her POA. [facility]   [name] DonningAlesia Ameia Morency RN CCM Case Mgmt phone [phone]

## 2014-05-19 NOTE — Care Management Note (Signed)
    Page 1 of 1   05/19/2014     11:11:41 AM CARE MANAGEMENT NOTE 05/19/2014  Patient:  [name] [name],[name] [name]   Account Number:  [id number]  Date Initiated:  05/18/2014  Documentation initiated by:  [facility]  Subjective/Objective Assessment:   COPD exacerbation     Action/Plan:   SNF   Anticipated DC Date:  05/19/2014   Anticipated DC Plan:  SKILLED NURSING FACILITY  In-house referral  Clinical Social Worker      DC Planning Services  CM consult      Choice offered to / List presented to:             Status of service:  Completed, signed off Medicare Important Message given?  YES (If response is "NO", the following Medicare IM given date fields will be blank) Date Medicare IM given:  05/18/2014 Medicare IM given by:  [facility] Date Additional Medicare IM given:   Additional Medicare IM given by:    Discharge Disposition:  SKILLED NURSING FACILITY  Per UR Regulation:  Reviewed for med. necessity/level of care/duration of stay  If discussed at Long Length of Stay Meetings, dates discussed:    Comments:  05/18/2014 1025 NCM spoke to pt and states her son, [name] [name] [phone]  her POA. [facility] [name] DonningAlesia Shavis RN CCM Case Mgmt phone [phone]

## 2014-05-19 NOTE — Progress Notes (Signed)
Patient is discharged from room [id number] at this time. Alert and in stable condition. On continuous O2 @ 3L/min nasal cannula. IV site d/c'd. [facility]Sheila [facility] Transported via stretcher by [facility] with belongings at side.

## 2014-05-19 NOTE — Clinical Social Work Note (Signed)
Per MD patient ready to DC back to [name]. RN, patient/family Barbette Reichmann(Don Owens), and facility notified of patient's DC. RN given number for report. DC packet on patient's chart. Ambulance transport requested for patient. CSW signing off at this time.   Roddie McBryant Velvet Moomaw MSW, [place]WA, [place], [phone]

## 2014-05-19 NOTE — Discharge Summary (Signed)
Patient was seen, examined,treatment plan was discussed with the  Advance Practice Provider.  I have directly reviewed the clinical findings, lab, imaging studies and management of this patient in detail. I have made the necessary changes to the above noted documentation, and agree with the documentation, as recorded by the Advance Practice Provider.   [age] feels female admitted with COPD exacerbation, acute on chronic respiratory failure-significantly improved. Stable for discharge.  Windell NorfolkS Ghimire MD [facility].

## 2014-05-27 ENCOUNTER — Encounter: Payer: Self-pay | Admitting: Adult Health

## 2014-09-20 ENCOUNTER — Encounter (HOSPITAL_COMMUNITY): Payer: Self-pay | Admitting: Emergency Medicine

## 2014-09-20 ENCOUNTER — Inpatient Hospital Stay (HOSPITAL_COMMUNITY)
Admission: EM | Admit: 2014-09-20 | Discharge: 2014-09-25 | DRG: 291 | Disposition: A | Discharge status: Skilled Nursing Facility | Payer: Medicare Other | Attending: Internal Medicine | Admitting: Internal Medicine

## 2014-09-20 ENCOUNTER — Emergency Department (HOSPITAL_COMMUNITY): Payer: Medicare Other

## 2014-09-20 ENCOUNTER — Inpatient Hospital Stay (HOSPITAL_COMMUNITY): Payer: Medicare Other

## 2014-09-20 DIAGNOSIS — Z87891 Personal history of nicotine dependence: Secondary | ICD-10-CM

## 2014-09-20 DIAGNOSIS — D696 Thrombocytopenia, unspecified: Secondary | ICD-10-CM | POA: Diagnosis present

## 2014-09-20 DIAGNOSIS — I5032 Chronic diastolic (congestive) heart failure: Secondary | ICD-10-CM | POA: Diagnosis present

## 2014-09-20 DIAGNOSIS — J9621 Acute and chronic respiratory failure with hypoxia: Secondary | ICD-10-CM | POA: Diagnosis present

## 2014-09-20 DIAGNOSIS — I5033 Acute on chronic diastolic (congestive) heart failure: Secondary | ICD-10-CM | POA: Diagnosis present

## 2014-09-20 DIAGNOSIS — I10 Essential (primary) hypertension: Secondary | ICD-10-CM | POA: Diagnosis present

## 2014-09-20 DIAGNOSIS — Y92129 Unspecified place in nursing home as the place of occurrence of the external cause: Secondary | ICD-10-CM | POA: Diagnosis not present

## 2014-09-20 DIAGNOSIS — I4510 Unspecified right bundle-branch block: Secondary | ICD-10-CM | POA: Diagnosis present

## 2014-09-20 DIAGNOSIS — J441 Chronic obstructive pulmonary disease with (acute) exacerbation: Secondary | ICD-10-CM | POA: Diagnosis present

## 2014-09-20 DIAGNOSIS — Z9981 Dependence on supplemental oxygen: Secondary | ICD-10-CM

## 2014-09-20 DIAGNOSIS — E785 Hyperlipidemia, unspecified: Secondary | ICD-10-CM | POA: Diagnosis present

## 2014-09-20 DIAGNOSIS — Z7982 Long term (current) use of aspirin: Secondary | ICD-10-CM | POA: Diagnosis not present

## 2014-09-20 DIAGNOSIS — N182 Chronic kidney disease, stage 2 (mild): Secondary | ICD-10-CM | POA: Diagnosis present

## 2014-09-20 DIAGNOSIS — S22000A Wedge compression fracture of unspecified thoracic vertebra, initial encounter for closed fracture: Secondary | ICD-10-CM | POA: Diagnosis present

## 2014-09-20 DIAGNOSIS — M1990 Unspecified osteoarthritis, unspecified site: Secondary | ICD-10-CM | POA: Diagnosis present

## 2014-09-20 DIAGNOSIS — R778 Other specified abnormalities of plasma proteins: Secondary | ICD-10-CM | POA: Diagnosis present

## 2014-09-20 DIAGNOSIS — R0602 Shortness of breath: Secondary | ICD-10-CM

## 2014-09-20 DIAGNOSIS — Z66 Do not resuscitate: Secondary | ICD-10-CM | POA: Diagnosis present

## 2014-09-20 DIAGNOSIS — K219 Gastro-esophageal reflux disease without esophagitis: Secondary | ICD-10-CM | POA: Diagnosis present

## 2014-09-20 DIAGNOSIS — E039 Hypothyroidism, unspecified: Secondary | ICD-10-CM | POA: Diagnosis present

## 2014-09-20 DIAGNOSIS — W19XXXA Unspecified fall, initial encounter: Secondary | ICD-10-CM | POA: Diagnosis present

## 2014-09-20 DIAGNOSIS — I129 Hypertensive chronic kidney disease with stage 1 through stage 4 chronic kidney disease, or unspecified chronic kidney disease: Secondary | ICD-10-CM | POA: Diagnosis present

## 2014-09-20 DIAGNOSIS — M549 Dorsalgia, unspecified: Secondary | ICD-10-CM

## 2014-09-20 DIAGNOSIS — J449 Chronic obstructive pulmonary disease, unspecified: Secondary | ICD-10-CM

## 2014-09-20 DIAGNOSIS — I509 Heart failure, unspecified: Secondary | ICD-10-CM

## 2014-09-20 DIAGNOSIS — R7989 Other specified abnormal findings of blood chemistry: Secondary | ICD-10-CM | POA: Diagnosis present

## 2014-09-20 HISTORY — DX: Heart failure, unspecified: I50.9

## 2014-09-20 LAB — URINALYSIS, ROUTINE W REFLEX MICROSCOPIC
Bilirubin Urine: NEGATIVE
Glucose, UA: NEGATIVE mg/dL
HGB URINE DIPSTICK: NEGATIVE
Ketones, ur: NEGATIVE mg/dL
Leukocytes, UA: NEGATIVE
Nitrite: NEGATIVE
Protein, ur: NEGATIVE mg/dL
Specific Gravity, Urine: 1.015 (ref 1.005–1.030)
Urobilinogen, UA: 0.2 mg/dL (ref 0.0–1.0)
pH: 5.5 (ref 5.0–8.0)

## 2014-09-20 LAB — CBC WITH DIFFERENTIAL/PLATELET
Basophils Absolute: 0 10*3/uL (ref 0.0–0.1)
Basophils Relative: 1 % (ref 0–1)
EOS PCT: 4 % (ref 0–5)
Eosinophils Absolute: 0.3 10*3/uL (ref 0.0–0.7)
HCT: 41.2 % (ref 36.0–46.0)
HEMOGLOBIN: 12.8 g/dL (ref 12.0–15.0)
LYMPHS PCT: 9 % — AB (ref 12–46)
Lymphs Abs: 0.7 10*3/uL (ref 0.7–4.0)
MCH: 29.5 pg (ref 26.0–34.0)
MCHC: 31.1 g/dL (ref 30.0–36.0)
MCV: 94.9 fL (ref 78.0–100.0)
MONO ABS: 0.7 10*3/uL (ref 0.1–1.0)
Monocytes Relative: 9 % (ref 3–12)
Neutro Abs: 6.2 10*3/uL (ref 1.7–7.7)
Neutrophils Relative %: 77 % (ref 43–77)
PLATELETS: 145 10*3/uL — AB (ref 150–400)
RBC: 4.34 MIL/uL (ref 3.87–5.11)
RDW: 14.6 % (ref 11.5–15.5)
WBC: 7.9 10*3/uL (ref 4.0–10.5)

## 2014-09-20 LAB — COMPREHENSIVE METABOLIC PANEL
ALBUMIN: 2.7 g/dL — AB (ref 3.5–5.2)
ALT: 12 U/L (ref 0–35)
ANION GAP: 7 (ref 5–15)
AST: 17 U/L (ref 0–37)
Alkaline Phosphatase: 77 U/L (ref 39–117)
BUN: 30 mg/dL — ABNORMAL HIGH (ref 6–23)
CALCIUM: 8.8 mg/dL (ref 8.4–10.5)
CO2: 29 mmol/L (ref 19–32)
CREATININE: 0.86 mg/dL (ref 0.50–1.10)
Chloride: 105 mmol/L (ref 96–112)
GFR calc non Af Amer: 61 mL/min — ABNORMAL LOW (ref >90)
GFR, EST AFRICAN AMERICAN: 70 mL/min — AB (ref >90)
GLUCOSE: 142 mg/dL — AB (ref 70–99)
Potassium: 4 mmol/L (ref 3.5–5.1)
Sodium: 141 mmol/L (ref 135–145)
TOTAL PROTEIN: 5.6 g/dL — AB (ref 6.0–8.3)
Total Bilirubin: 0.5 mg/dL (ref 0.3–1.2)

## 2014-09-20 LAB — TROPONIN I
TROPONIN I: 0.46 ng/mL — AB (ref <0.031)
TROPONIN I: 0.62 ng/mL — AB (ref <0.031)

## 2014-09-20 LAB — BRAIN NATRIURETIC PEPTIDE: B NATRIURETIC PEPTIDE 5: 113.7 pg/mL — AB (ref 0.0–100.0)

## 2014-09-20 LAB — MRSA PCR SCREENING: MRSA by PCR: NEGATIVE

## 2014-09-20 MED ORDER — ONDANSETRON HCL 4 MG/2ML IJ SOLN: 4.0000 mg | Freq: Four times a day (QID) | INTRAMUSCULAR | Status: DC | PRN

## 2014-09-20 MED ORDER — PANTOPRAZOLE SODIUM 40 MG IV SOLR
40.0000 mg | INTRAVENOUS | Status: DC
  Administered 2014-09-20: 40 mg via INTRAVENOUS
  Filled 2014-09-20 (×2): qty 40

## 2014-09-20 MED ORDER — ALBUTEROL SULFATE (2.5 MG/3ML) 0.083% IN NEBU: 2.5000 mg | INHALATION_SOLUTION | RESPIRATORY_TRACT | Status: DC | PRN

## 2014-09-20 MED ORDER — SODIUM CHLORIDE 0.9 % IV SOLN: 250.0000 mL | INTRAVENOUS | Status: DC | PRN

## 2014-09-20 MED ORDER — SODIUM CHLORIDE 0.9 % IJ SOLN
3.0000 mL | Freq: Two times a day (BID) | INTRAMUSCULAR | Status: DC
  Administered 2014-09-20 – 2014-09-25 (×7): 3 mL via INTRAVENOUS

## 2014-09-20 MED ORDER — LEVOTHYROXINE SODIUM 100 MCG IV SOLR
62.5000 ug | Freq: Every day | INTRAVENOUS | Status: DC
  Administered 2014-09-21: 62.5 ug via INTRAVENOUS
  Filled 2014-09-20 (×3): qty 5

## 2014-09-20 MED ORDER — ONDANSETRON HCL 4 MG PO TABS: 4.0000 mg | ORAL_TABLET | Freq: Four times a day (QID) | ORAL | Status: DC | PRN

## 2014-09-20 MED ORDER — FUROSEMIDE 10 MG/ML IJ SOLN: 40.0000 mg | Freq: Two times a day (BID) | INTRAMUSCULAR | Status: DC

## 2014-09-20 MED ORDER — MORPHINE SULFATE 2 MG/ML IJ SOLN
1.0000 mg | INTRAMUSCULAR | Status: DC | PRN
  Administered 2014-09-22 (×2): 2 mg via INTRAVENOUS
  Administered 2014-09-23 (×2): 1 mg via INTRAVENOUS
  Administered 2014-09-23 – 2014-09-24 (×2): 2 mg via INTRAVENOUS
  Filled 2014-09-20 (×6): qty 1

## 2014-09-20 MED ORDER — ASPIRIN 325 MG PO TABS
325.0000 mg | ORAL_TABLET | Freq: Four times a day (QID) | ORAL | Status: DC | PRN
  Filled 2014-09-20: qty 1

## 2014-09-20 MED ORDER — SODIUM CHLORIDE 0.9 % IJ SOLN
3.0000 mL | Freq: Two times a day (BID) | INTRAMUSCULAR | Status: DC
  Administered 2014-09-20 – 2014-09-23 (×6): 3 mL via INTRAVENOUS

## 2014-09-20 MED ORDER — IPRATROPIUM BROMIDE 0.02 % IN SOLN: 0.5000 mg | Freq: Four times a day (QID) | RESPIRATORY_TRACT | Status: DC

## 2014-09-20 MED ORDER — SODIUM CHLORIDE 0.9 % IJ SOLN: 3.0000 mL | INTRAMUSCULAR | Status: DC | PRN

## 2014-09-20 MED ORDER — ALBUTEROL SULFATE (2.5 MG/3ML) 0.083% IN NEBU: 2.5000 mg | INHALATION_SOLUTION | Freq: Four times a day (QID) | RESPIRATORY_TRACT | Status: DC

## 2014-09-20 MED ORDER — POTASSIUM CHLORIDE CRYS ER 20 MEQ PO TBCR
20.0000 meq | EXTENDED_RELEASE_TABLET | Freq: Every day | ORAL | Status: DC
  Administered 2014-09-20 – 2014-09-23 (×4): 20 meq via ORAL
  Filled 2014-09-20 (×7): qty 1

## 2014-09-20 MED ORDER — LORAZEPAM 2 MG/ML IJ SOLN
0.5000 mg | Freq: Once | INTRAMUSCULAR | Status: AC
  Administered 2014-09-20: 0.5 mg via INTRAVENOUS
  Filled 2014-09-20: qty 1

## 2014-09-20 MED ORDER — HEPARIN SODIUM (PORCINE) 5000 UNIT/ML IJ SOLN
5000.0000 [IU] | Freq: Three times a day (TID) | INTRAMUSCULAR | Status: DC
  Administered 2014-09-20 – 2014-09-25 (×14): 5000 [IU] via SUBCUTANEOUS
  Filled 2014-09-20 (×15): qty 1

## 2014-09-20 MED ORDER — AMLODIPINE BESYLATE 10 MG PO TABS
10.0000 mg | ORAL_TABLET | Freq: Every day | ORAL | Status: DC
  Administered 2014-09-21 – 2014-09-22 (×2): 10 mg via ORAL
  Filled 2014-09-20 (×2): qty 1

## 2014-09-20 MED ORDER — PRAVASTATIN SODIUM 40 MG PO TABS
40.0000 mg | ORAL_TABLET | Freq: Every day | ORAL | Status: DC
Start: 2014-09-20 — End: 2014-09-25
  Administered 2014-09-20 – 2014-09-24 (×5): 40 mg via ORAL
  Filled 2014-09-20 (×6): qty 1

## 2014-09-20 MED ORDER — IPRATROPIUM-ALBUTEROL 0.5-2.5 (3) MG/3ML IN SOLN
3.0000 mL | Freq: Four times a day (QID) | RESPIRATORY_TRACT | Status: DC
  Administered 2014-09-20 – 2014-09-24 (×16): 3 mL via RESPIRATORY_TRACT
  Filled 2014-09-20 (×16): qty 3

## 2014-09-20 MED ORDER — FUROSEMIDE 10 MG/ML IJ SOLN
40.0000 mg | Freq: Every day | INTRAMUSCULAR | Status: DC
  Administered 2014-09-20 – 2014-09-22 (×3): 40 mg via INTRAVENOUS
  Filled 2014-09-20 (×5): qty 4

## 2014-09-20 MED ORDER — FUROSEMIDE 10 MG/ML IJ SOLN
40.0000 mg | Freq: Once | INTRAMUSCULAR | Status: AC
  Administered 2014-09-20: 40 mg via INTRAVENOUS
  Filled 2014-09-20: qty 4

## 2014-09-20 NOTE — ED Notes (Signed)
Assisted Ed, RN with placement of foley cath with success; readjusted pt on stretcher; pt resting now

## 2014-09-20 NOTE — ED Provider Notes (Signed)
CSN: [id number]     Arrival date & time 09/20/14  0930 History   First MD Initiated Contact with Patient 09/20/14 [phone]     Chief Complaint  Patient presents with  . Shortness of Breath    A level V caveat due to shortness of breath. (Consider location/radiation/quality/duration/timing/severity/associated sxs/prior Treatment) Patient is a 81 y.o. female presenting with shortness of breath.  Shortness of Breath  reported shortness of breath from nursing home. Has had an x-ray that was negative over the last few days. Room air sats on 70-80%. She is on a baseline oxygen 2 L. Presents on BiPAP. Patient is a DO NOT RESUSCITATE.  Past Medical History  Diagnosis Date  . Hypertension   . Hyperlipidemia   . Thyroid disease   . Hypokalemia   . Vitamin D deficiency   . COPD (chronic obstructive pulmonary disease)    History reviewed. No pertinent past surgical history. No family history on file. History  Substance Use Topics  . Smoking status: Former Smoker    Types: Cigarettes  . Smokeless tobacco: Not on file  . Alcohol Use: No   OB History    No data available     Review of Systems  Unable to perform ROS Respiratory: Positive for shortness of breath.       Allergies  Review of patient's allergies indicates no known allergies.  Home Medications   Prior to Admission medications   Medication Sig Start Date End Date Taking? Authorizing Provider  albuterol (PROVENTIL) (2.5 MG/3ML) 0.083% nebulizer solution Take 2.5 mg by nebulization 4 (four) times daily.   Yes Historical Provider, MD  amLODipine (NORVASC) 10 MG tablet Take 10 mg by mouth daily.   Yes Historical Provider, MD  aspirin 325 MG tablet Take 325 mg by mouth every 6 (six) hours as needed for moderate pain.    Yes Historical Provider, MD  cefTRIAXone (ROCEPHIN) 1 G injection Inject 1 g into the muscle daily.   Yes Historical Provider, MD  furosemide (LASIX) 10 MG/ML solution Take 60 mg by mouth once.   Yes Historical  Provider, MD  HYDROcodone-acetaminophen (NORCO/VICODIN) 5-325 MG per tablet Take 1 tablet by mouth every 6 (six) hours as needed for moderate pain.   Yes Historical Provider, MD  ipratropium (ATROVENT) 0.02 % nebulizer solution Take 0.5 mg by nebulization 4 (four) times daily.   Yes Historical Provider, MD  levothyroxine (SYNTHROID, LEVOTHROID) 125 MCG tablet Take 125 mcg by mouth daily before breakfast.   Yes Historical Provider, MD  lisinopril (PRINIVIL,ZESTRIL) 2.5 MG tablet Take 1 tablet (2.5 mg total) by mouth daily. 05/18/14  Yes Hollice EspySendil K Krishnan, MD  methylPREDNISolone sodium succinate (SOLU-MEDROL) 125 mg/2 mL injection Inject 125 mg into the vein once.   Yes Historical Provider, MD  metoprolol (LOPRESSOR) 50 MG tablet Take 50 mg by mouth 2 (two) times daily.   Yes Historical Provider, MD  omeprazole (PRILOSEC) 20 MG capsule Take 20 mg by mouth daily.   Yes Historical Provider, MD  potassium chloride SA (K-DUR,KLOR-CON) 20 MEQ tablet Take 20 mEq by mouth daily.   Yes Historical Provider, MD  pravastatin (PRAVACHOL) 40 MG tablet Take 40 mg by mouth at bedtime.   Yes Historical Provider, MD  Vitamin D, Cholecalciferol, 1000 UNITS TABS Take 1,000 Units by mouth daily.   Yes Historical Provider, MD  levofloxacin (LEVAQUIN) 750 MG tablet One dose on 10/7 & 1 dose on 10/9 Patient not taking: Reported on 09/20/2014 05/18/14   Hollice EspySendil K Krishnan, MD  BP 129/66 mmHg  Pulse 100  Temp(Src) 98.7 F (37.1 C) (Oral)  Resp 19  SpO2 100% Physical Exam  Constitutional: She appears well-developed.  Somewhat dry mucous membranes.  HENT:  Head: Normocephalic.  Neck: Neck supple. JVD present.  Pulmonary/Chest:  Mild tachypnea.  Abdominal: Soft. There is no tenderness.  Neurological: She is alert.  Skin: Skin is warm.    ED Course  Procedures (including critical care time) Labs Review Labs Reviewed  COMPREHENSIVE METABOLIC PANEL - Abnormal; Notable for the following:    Glucose, Bld 142 (*)     BUN 30 (*)    Total Protein 5.6 (*)    Albumin 2.7 (*)    GFR calc non Af Amer 61 (*)    GFR calc Af Amer 70 (*)    All other components within normal limits  CBC WITH DIFFERENTIAL/PLATELET - Abnormal; Notable for the following:    Platelets 145 (*)    Lymphocytes Relative 9 (*)    All other components within normal limits  BRAIN NATRIURETIC PEPTIDE - Abnormal; Notable for the following:    B Natriuretic Peptide 113.7 (*)    All other components within normal limits  TROPONIN I - Abnormal; Notable for the following:    Troponin I 0.46 (*)    All other components within normal limits  URINE CULTURE  URINALYSIS, ROUTINE W REFLEX MICROSCOPIC    Imaging Review Dg Chest Portable 1 View  09/20/2014   CLINICAL DATA:  Shortness of breath.  EXAM: PORTABLE CHEST - 1 VIEW  COMPARISON:  May 15, 2014.  FINDINGS: Stable cardiomediastinal silhouette. Patient is rotated to the left. No pneumothorax or significant pleural effusion is noted. Mild bilateral perihilar and basilar interstitial densities are noted consistent with pulmonary edema. Bony thorax is intact.  IMPRESSION: Findings consistent with mild bilateral pulmonary edema.   Electronically Signed   By: Roque Lias M.D.   On: 09/20/2014 11:25     EKG Interpretation   Date/Time:  Monday September 20 2014 09:37:12 EST Ventricular Rate:  107 PR Interval:  171 QRS Duration: 143 QT Interval:  364 QTC Calculation: 486 R Axis:   64 Text Interpretation:  Sinus tachycardia Right bundle branch block No  significant change since last tracing Confirmed by Rubin Payor  MD, Harrold Donath  [phone]) on 09/20/2014 9:55:41 AM      MDM   Final diagnoses:  Congestive heart failure, unspecified congestive heart failure chronicity, unspecified congestive heart failure type  Chronic obstructive pulmonary disease, unspecified COPD, unspecified chronic bronchitis type  Acute on chronic respiratory failure with hypoxia  CRITICAL CARE Performed by:  Billee Cashing Total critical care time: 30 Critical care time was exclusive of separately billable procedures and treating other patients. Critical care was necessary to treat or prevent imminent or life-threatening deterioration. Critical care was time spent personally by me on the following activities: development of treatment plan with patient and/or surrogate as well as nursing, discussions with consultants, evaluation of patient's response to treatment, examination of patient, obtaining history from patient or surrogate, ordering and performing treatments and interventions, ordering and review of laboratory studies, ordering and review of radiographic studies, pulse oximetry and re-evaluation of patient's condition.   Patient with shortness of breath. She is a DO NOT RESUSCITATE. Appears to be a combination of CHF and COPD. BNP is elevated. X-ray shows CHF. Patient required BiPAP and then a trial of just nasal cannula and required replacement on the BiPAP. Will admit to internal medicine. Initially step down bed,  but after some diuresis she did not require the BiPAP anymore and was admitted to telemetry.    Juliet Rude. Rubin Payor, MD 09/20/14 [phone]

## 2014-09-20 NOTE — ED Notes (Signed)
Attempted to call son.

## 2014-09-20 NOTE — ED Notes (Signed)
Myself and Ed, RN changed pt and placed a clean dry diaper on; readjusted pt on stretcher and got her a warm blanket

## 2014-09-20 NOTE — Progress Notes (Signed)
RT removed patient from BIPAP and placed on 4lnc. 02 saturations are 92%. Patient tolerated well. Vital signs stable at this time. RN aware. RT will continue to monitor.

## 2014-09-20 NOTE — Progress Notes (Addendum)
Report received from the ED at 1608, pt arrived to the unit at 1650 via stretcher, vitals taken, pain assessed; foley intact and unclamped; No pressure ulcers noted; placed on telemetry and verified. Pt oriented to the unit and room; bed alarm turned on. Pt call light within reach and will continue to monitor quietly. Arabella MerlesP. Amo Miniya Miguez RN.

## 2014-09-20 NOTE — H&P (Signed)
[facility] History and Physical                                                                                    [name] [name], is a 81 y.o. female  MRN: [id number]   DOB - [birth year]  Admit Date - 09/20/2014  Outpatient Primary MD for the patient is No primary care provider on file.  With History of -  Past Medical History  Diagnosis Date  . Hypertension   . Hyperlipidemia   . Thyroid disease   . Hypokalemia   . Vitamin D deficiency   . COPD (chronic obstructive pulmonary disease)       History reviewed. No pertinent past surgical history.  in for   Chief Complaint  Patient presents with  . Shortness of Breath     HPI [name] [place] [name]  is a 81 y.o. female, resident of skilled nursing facility/DO NOT RESUSCITATE with known history of O2 dependent COPD and diastolic heart failure as well as hypothyroidism and dyslipidemia. Was sent to the ER from the skilled nursing facility for reported shortness of breath. Her room air saturations were 70-80% when assessed in the ER. She required BiPAP at presentation. Her chest x-ray was consistent with edema. She was given Lasix IV in the ER. She had mild elevation in her BNP. An attempt was made to wean her to nasal cannula from BiPAP but this failed. According to the nursing home records the patient recently had received Rocephin for suspected urinary tract infection. Upon our evaluation difficult to obtain adequate history from patient because of the BiPAP mask. She also seemed to have some difficulty understanding some questions I was asking her. She reports that she fell at some point recently at the nursing home but was unable to tell me whether she got dizzy first or if she had a mechanical fall. Unable to determine if she has had flulike symptoms preceding current respiratory symptoms.  Review of Systems   In addition to the HPI above,  No apparent Fever-chills, myalgias or other constitutional symptoms No  Headache, changes with Vision or hearing, new weakness, tingling, numbness in any extremity, No problems swallowing food or Liquids, indigestion/reflux No Abdominal pain, N/V; no melena or hematochezia, no dark tarry stools, Bowel movements are regular, No new skin rashes, lesions, masses or bruises, No new joints pains-aches No recent weight gain or loss No polyuria, polydypsia or polyphagia,  *A full 10 point Review of Systems was done, except as stated above, all other Review of Systems were negative.  Social History History  Substance Use Topics  . Smoking status: Former Smoker    Types: Cigarettes  . Smokeless tobacco: Not on file  . Alcohol Use: No    Family History No family history on file.  Prior to Admission medications   Medication Sig Start Date End Date Taking? Authorizing Provider  albuterol (PROVENTIL) (2.5 MG/3ML) 0.083% nebulizer solution Take 2.5 mg by nebulization 4 (four) times daily.   Yes Historical Provider, MD  amLODipine (NORVASC) 10 MG tablet Take 10 mg by mouth daily.   Yes Historical Provider, MD  aspirin 325 MG tablet Take 325 mg  by mouth every 6 (six) hours as needed for moderate pain.    Yes Historical Provider, MD  cefTRIAXone (ROCEPHIN) 1 G injection Inject 1 g into the muscle daily.   Yes Historical Provider, MD  furosemide (LASIX) 10 MG/ML solution Take 60 mg by mouth once.   Yes Historical Provider, MD  HYDROcodone-acetaminophen (NORCO/VICODIN) 5-325 MG per tablet Take 1 tablet by mouth every 6 (six) hours as needed for moderate pain.   Yes Historical Provider, MD  ipratropium (ATROVENT) 0.02 % nebulizer solution Take 0.5 mg by nebulization 4 (four) times daily.   Yes Historical Provider, MD  levothyroxine (SYNTHROID, LEVOTHROID) 125 MCG tablet Take 125 mcg by mouth daily before breakfast.   Yes Historical Provider, MD  lisinopril (PRINIVIL,ZESTRIL) 2.5 MG tablet Take 1 tablet (2.5 mg total) by mouth daily. 05/18/14  Yes Hollice Espy, MD    methylPREDNISolone sodium succinate (SOLU-MEDROL) 125 mg/2 mL injection Inject 125 mg into the vein once.   Yes Historical Provider, MD  metoprolol (LOPRESSOR) 50 MG tablet Take 50 mg by mouth 2 (two) times daily.   Yes Historical Provider, MD  omeprazole (PRILOSEC) 20 MG capsule Take 20 mg by mouth daily.   Yes Historical Provider, MD  potassium chloride SA (K-DUR,KLOR-CON) 20 MEQ tablet Take 20 mEq by mouth daily.   Yes Historical Provider, MD  pravastatin (PRAVACHOL) 40 MG tablet Take 40 mg by mouth at bedtime.   Yes Historical Provider, MD  Vitamin D, Cholecalciferol, 1000 UNITS TABS Take 1,000 Units by mouth daily.   Yes Historical Provider, MD  levofloxacin (LEVAQUIN) 750 MG tablet One dose on 10/7 & 1 dose on 10/9 Patient not taking: Reported on 09/20/2014 05/18/14   Hollice Espy, MD    No Known Allergies  Physical Exam  Vitals  Blood pressure 128/67, pulse 107, temperature 98.7 F (37.1 C), temperature source Oral, resp. rate 19, SpO2 96 %.   General: Old acute respiratory distress as evidenced by increased respiratory rate  Psych:  Normal affect, Denies Suicidal or Homicidal ideations, Awake Alert, Oriented X 3.  Neuro:   No focal neurological deficits, CN II through XII intact, Strength 5/5 all 4 extremities, Sensation intact all 4 extremities.  ENT:  Ears and Eyes appear Normal, Conjunctivae clear, PER. Moist oral mucosa without erythema or exudates.  Neck:  Supple, No lymphadenopathy appreciated  Respiratory:  Symmetrical chest wall movement, Good air movement bilaterally, mostly clear with bibasilar crackles and diminished in bases. BiPAP  Cardiac:  RRR, No Murmurs, no LE edema noted, no JVD, No carotid bruits, peripheral pulses palpable at 2+  Abdomen:  Positive bowel sounds, Soft, Non tender, Non distended,  No masses appreciated, no obvious hepatosplenomegaly  Skin:  No Cyanosis, Normal Skin Turgor, No Skin Rash or Bruise.  Extremities: Symmetrical without  obvious trauma or injury,  no effusions.  Data Review  CBC  Recent Labs Lab 09/20/14 1010  WBC 7.9  HGB 12.8  HCT 41.2  PLT 145*  MCV 94.9  MCH 29.5  MCHC 31.1  RDW 14.6  LYMPHSABS 0.7  MONOABS 0.7  EOSABS 0.3  BASOSABS 0.0    Chemistries   Recent Labs Lab 09/20/14 1010  NA 141  K 4.0  CL 105  CO2 29  GLUCOSE 142*  BUN 30*  CREATININE 0.86  CALCIUM 8.8  AST 17  ALT 12  ALKPHOS 77  BILITOT 0.5    CrCl cannot be calculated (Unknown ideal weight.).  No results for input(s): TSH, T4TOTAL, T3FREE, THYROIDAB  in the last 72 hours.  Invalid input(s): FREET3  Coagulation profile No results for input(s): INR, PROTIME in the last 168 hours.  No results for input(s): DDIMER in the last 72 hours.  Cardiac Enzymes  Recent Labs Lab 09/20/14 1010  TROPONINI 0.46*    Invalid input(s): POCBNP  Urinalysis    Component Value Date/Time   COLORURINE AMBER* 01/28/2014 1205   APPEARANCEUR CLEAR 01/28/2014 1205   LABSPEC 1.029 01/28/2014 1205   PHURINE 5.5 01/28/2014 1205   GLUCOSEU 100* 01/28/2014 1205   HGBUR NEGATIVE 01/28/2014 1205   BILIRUBINUR MODERATE* 01/28/2014 1205   KETONESUR 15* 01/28/2014 1205   PROTEINUR 100* 01/28/2014 1205   UROBILINOGEN 1.0 01/28/2014 1205   NITRITE NEGATIVE 01/28/2014 1205   LEUKOCYTESUR NEGATIVE 01/28/2014 1205    Imaging results:   Dg Chest Portable 1 View  09/20/2014   CLINICAL DATA:  Shortness of breath.  EXAM: PORTABLE CHEST - 1 VIEW  COMPARISON:  May 15, 2014.  FINDINGS: Stable cardiomediastinal silhouette. Patient is rotated to the left. No pneumothorax or significant pleural effusion is noted. Mild bilateral perihilar and basilar interstitial densities are noted consistent with pulmonary edema. Bony thorax is intact.  IMPRESSION: Findings consistent with mild bilateral pulmonary edema.   Electronically Signed   By: Roque LiasJames  Green M.D.   On: 09/20/2014 11:25     EKG: Sinus tachycardia with right bundle branch  block   Assessment & Plan  Active Problems:   Acute respiratory failure with hypoxia:   A) COPD exacerbation   B) Acute on chronic diastolic CHF (congestive heart failure), NYHA class 1 -Continue BiPAP and wean as tolerated -No wheezing on exam-continue preadmission nebs-no indication at this time for Solu-Medrol -Respiratory symptoms seem primarily driven by pulmonary edema/CHF -No leukocytosis or left shift and no focal infiltrate so we'll forego antibiotics at this time -Chest x-ray has a mixed picture of edema with a lacy latticelike finding as well that is concerning for pneumonitis but patient without apparent symptoms of significant upper respiratory infection or flulike symptoms -Continue supportive care -Admitted to stepdown -Patient is DO NOT RESUSCITATE    Essential hypertension, benign -Current blood pressure controlled -May take home meds with sip of water   Mild thrombocytopenia -This is an intermittently recurring problem    GERD  -Continue IV Protonix    CKD, stage II -Baseline renal function 18/0.66 as of October 2013 -Current renal function 30/0.86 -Follow    Dyslipidemia -Statin on hold while nothing by mouth    Hypothyroidism -IV Synthroid while on BiPAP     DVT Prophylaxis: Subcutaneous heparin  Family Communication:  No family at bedside   Code Status:  DO NOT RESUSCITATE  Condition:  Guarded  Time spent in minutes : 60   ELLIS,ALLISON L. ANP on 09/20/2014 at 1:04 PM  Between 7am to 7pm - Pager - [phone]  After 7pm go to [email or web address] - password [email or web address]  And look for the night coverage person covering me after hours  [facility]

## 2014-09-20 NOTE — ED Notes (Signed)
Received pt via EMS from [facility] facility with c/o shortness of breath. Pt has history of COPD and CHF. Initial pulse ox for EMS 70-80's. Pt was in process of receiving a second neb treatment at the facility going at 2 L/min. Pt placed on CPAP pulse ox increased to 93%.

## 2014-09-20 NOTE — ED Notes (Signed)
Dr. Rubin PayorPickering made aware of dec. Oxygen sats. Called rt to put pt. On bipap.

## 2014-09-21 ENCOUNTER — Other Ambulatory Visit: Payer: Self-pay

## 2014-09-21 ENCOUNTER — Encounter (HOSPITAL_COMMUNITY): Payer: Self-pay | Admitting: General Practice

## 2014-09-21 LAB — CBC
HCT: 38.8 % (ref 36.0–46.0)
Hemoglobin: 12.2 g/dL (ref 12.0–15.0)
MCH: 29.4 pg (ref 26.0–34.0)
MCHC: 31.4 g/dL (ref 30.0–36.0)
MCV: 93.5 fL (ref 78.0–100.0)
PLATELETS: 159 10*3/uL (ref 150–400)
RBC: 4.15 MIL/uL (ref 3.87–5.11)
RDW: 14.4 % (ref 11.5–15.5)
WBC: 5.9 10*3/uL (ref 4.0–10.5)

## 2014-09-21 LAB — URINE CULTURE
Colony Count: NO GROWTH
Culture: NO GROWTH

## 2014-09-21 LAB — COMPREHENSIVE METABOLIC PANEL
ALK PHOS: 72 U/L (ref 39–117)
ALT: 11 U/L (ref 0–35)
AST: 14 U/L (ref 0–37)
Albumin: 2.5 g/dL — ABNORMAL LOW (ref 3.5–5.2)
Anion gap: 8 (ref 5–15)
BUN: 29 mg/dL — AB (ref 6–23)
CO2: 31 mmol/L (ref 19–32)
Calcium: 8.7 mg/dL (ref 8.4–10.5)
Chloride: 102 mmol/L (ref 96–112)
Creatinine, Ser: 0.8 mg/dL (ref 0.50–1.10)
GFR calc non Af Amer: 66 mL/min — ABNORMAL LOW (ref >90)
GFR, EST AFRICAN AMERICAN: 77 mL/min — AB (ref >90)
Glucose, Bld: 105 mg/dL — ABNORMAL HIGH (ref 70–99)
POTASSIUM: 3.9 mmol/L (ref 3.5–5.1)
Sodium: 141 mmol/L (ref 135–145)
Total Bilirubin: 0.8 mg/dL (ref 0.3–1.2)
Total Protein: 5.3 g/dL — ABNORMAL LOW (ref 6.0–8.3)

## 2014-09-21 LAB — TROPONIN I
TROPONIN I: 0.44 ng/mL — AB (ref <0.031)
Troponin I: 0.48 ng/mL — ABNORMAL HIGH (ref <0.031)
Troponin I: 0.37 ng/mL — ABNORMAL HIGH (ref <0.031)
Troponin I: 0.33 ng/mL — ABNORMAL HIGH (ref <0.031)

## 2014-09-21 MED ORDER — ENSURE COMPLETE PO LIQD
237.0000 mL | Freq: Two times a day (BID) | ORAL | Status: DC
  Administered 2014-09-21 – 2014-09-25 (×7): 237 mL via ORAL

## 2014-09-21 MED ORDER — LEVOTHYROXINE SODIUM 125 MCG PO TABS
125.0000 ug | ORAL_TABLET | Freq: Every day | ORAL | Status: DC
  Administered 2014-09-22 – 2014-09-25 (×4): 125 ug via ORAL
  Filled 2014-09-21 (×7): qty 1

## 2014-09-21 MED ORDER — PANTOPRAZOLE SODIUM 40 MG PO TBEC
40.0000 mg | DELAYED_RELEASE_TABLET | Freq: Every day | ORAL | Status: DC
  Administered 2014-09-21 – 2014-09-25 (×5): 40 mg via ORAL
  Filled 2014-09-21 (×4): qty 1

## 2014-09-21 MED ORDER — GUAIFENESIN ER 600 MG PO TB12
600.0000 mg | ORAL_TABLET | Freq: Two times a day (BID) | ORAL | Status: AC
  Administered 2014-09-22 (×2): 600 mg via ORAL
  Filled 2014-09-21 (×2): qty 1

## 2014-09-21 NOTE — Progress Notes (Signed)
Patient is currently on 4LNC and sats are 95%. Bipap is not needed at this time. Will continue to monitor.

## 2014-09-21 NOTE — Clinical Social Work Psychosocial (Signed)
Clinical Social Work Department BRIEF PSYCHOSOCIAL ASSESSMENT 09/21/2014  Patient:  [name] [name],[name] [name]     Account Number:  [id number]     Admit date:  09/20/2014  Clinical Social Worker:  [name] GardnerAVENPORT,[name] [name], CLINICAL SOCIAL WORKER  Date/Time:  09/21/2014 01:00 PM  Referred by:  Physician  Date Referred:  09/21/2014 Referred for  ALF Placement   Other Referral:   N/A   Interview type:  Patient Other interview type:   BSW intern spoke with patients grandson for more information reguarding discharge plans.    PSYCHOSOCIAL DATA Living Status:  FACILITY [name] Level of care:  Skilled Nursing Facility Primary support name:  [name] HillDonald Primary support relationship to patient:  FAMILY Degree of support available:   Support is good.    CURRENT CONCERNS Current Concerns  Post-Acute Placement   Other Concerns:   N/A    SOCIAL WORK ASSESSMENT / PLAN BSW intern spoke with patient at bedside reguarding her discharge plans. Patient did not know the name of the facility that she was returning too. BSW intern spoke with patients grandson and he is wanting her to return to [name] once stable. Patients grandson expressed concern about when she wouldd be discharged because [name]. CSW -[name] ClicheDonna [name] [facility] to [name] [name]. She stated that they would plan to accept patient back pending bed availability.   Flw placed on chart for MD's signature.   Assessment/plan status:  Psychosocial Support/Ongoing Assessment of Needs Other assessment/ plan:   N/A   Information/referral to community resources:   N/A    PATIENT'S/FAMILY'S RESPONSE TO PLAN OF CARE: Patient is agreeable to returning to [name]. She seemed at-ease knowning that she would be returning there. Patients grandson also was agreeable to the discharge plan an inquired as to how long patient would need to be in the hospital. SW services will  assist patient with return to facility when medically stable.       [name] GardnerErin Yvon [name], BSW Intern, [phone]

## 2014-09-21 NOTE — Care Management Note (Unsigned)
    Page 1 of 1   09/23/2014     2:41:53 PM CARE MANAGEMENT NOTE 09/23/2014  Patient:  [name] [name] [name],[name] [place]   Account Number:  [id number]  Date Initiated:  09/21/2014  Documentation initiated by:  Korben Carcione  Subjective/Objective Assessment:   Pt adm on 09/20/14 with CHF.  PTA, [facility]     Action/Plan:   CSW consulted to facilitate return to SNF when medically stable for dc.  Will follow progress   Anticipated DC Date:  09/25/2014   Anticipated DC Plan:  SKILLED NURSING FACILITY  In-house referral  Clinical Social Worker      DC Planning Services  CM consult      Choice offered to / List presented to:             Status of service:  In process, will continue to follow Medicare Important Message given?  YES (If response is "NO", the following Medicare IM given date fields will be blank) Date Medicare IM given:  09/23/2014 Medicare IM given by:  Shawnta Schlegel Date Additional Medicare IM given:   Additional Medicare IM given by:    Discharge Disposition:    Per UR Regulation:  Reviewed for med. necessity/level of care/duration of stay  If discussed at Long Length of Stay Meetings, dates discussed:    Comments:

## 2014-09-21 NOTE — Progress Notes (Signed)
Patient refuses to wear venturi mask. Patient wearing nasal cannula with Sp02=87-89%

## 2014-09-21 NOTE — Progress Notes (Signed)
[facility] PROGRESS NOTE  [name] [name] WGN:[id number]:[id number] DOB: 08/19/1928 DOA: 09/20/2014 PCP: Julian HySOUTH,STEPHEN ALAN, MD  Assessment/Plan: 1. Acute respiratory failure with hypoxia 1. Likely secondary to acutely decompensated CHF with COPD 2. Cont scheduled nebs 3. Cont 40mg  IV lasix 4. Thus far wt 58.6 ->57.6kg 5. Wt from 10/15 of 55kg 2. HTN 1. BP stable 2. Cont current regimen 3. Thrombocytopenia 1. Improved overnight 4. GERD 1. Stable 2. On PPI 5. CKD 2 1. Renal function normal 6. HLD 1. Pt continued on statin 7. Hypothyroid 1. Continue thyroid replacement as tolerated 8. DVT prophylaxis 1. Cont on heparin subQ 9. Elevated trop 1. Trending down 2. Pt currently asymptomatic, denies chest pain, nausea or diaphoresis 3. Will cont to follow serial enzymes  Code Status: Ful Family Communication: Pt in room (indicate person spoken with, relationship, and if by phone, the number) Disposition Plan: Pending   Consultants:    Procedures:    Antibiotics:  none (indicate start date, and stop date if known)  HPI/Subjective: Pt reports feeling better today. No acute events noted overnight  Objective: Filed Vitals:   09/21/14 0229 09/21/14 0435 09/21/14 0806 09/21/14 1110  BP: 112/49 145/50  125/78  Pulse: 64 73  75  Temp: 97.5 F (36.4 C) 99.5 F (37.5 C)    TempSrc: Oral Oral    Resp: 18 18  16   Weight:  57.6 kg (126 lb 15.8 oz)    SpO2: 100% 92% 92% 99%    Intake/Output Summary (Last 24 hours) at 09/21/14 1454 Last data filed at 09/21/14 1413  Gross per 24 hour  Intake    180 ml  Output   1750 ml  Net  -1570 ml   Filed Weights   09/20/14 1655 09/21/14 0435  Weight: 58.6 kg (129 lb 3 oz) 57.6 kg (126 lb 15.8 oz)    Exam:   General:  Awake, in nad  Cardiovascular: regular, s1, s2  Respiratory: normal resp effort, no wheezing  Abdomen: soft, nondistended  Musculoskeletal: perfused, no clubbing   Data Reviewed: Basic Metabolic  Panel:  Recent Labs Lab 09/20/14 1010 09/21/14 0805  NA 141 141  K 4.0 3.9  CL 105 102  CO2 29 31  GLUCOSE 142* 105*  BUN 30* 29*  CREATININE 0.86 0.80  CALCIUM 8.8 8.7   Liver Function Tests:  Recent Labs Lab 09/20/14 1010 09/21/14 0805  AST 17 14  ALT 12 11  ALKPHOS 77 72  BILITOT 0.5 0.8  PROT 5.6* 5.3*  ALBUMIN 2.7* 2.5*   No results for input(s): LIPASE, AMYLASE in the last 168 hours. No results for input(s): AMMONIA in the last 168 hours. CBC:  Recent Labs Lab 09/20/14 1010 09/21/14 0805  WBC 7.9 5.9  NEUTROABS 6.2  --   HGB 12.8 12.2  HCT 41.2 38.8  MCV 94.9 93.5  PLT 145* 159   Cardiac Enzymes:  Recent Labs Lab 09/20/14 1010 09/20/14 1959 09/21/14 0111 09/21/14 0805  TROPONINI 0.46* 0.62* 0.48* 0.44*   BNP (last 3 results)  Recent Labs  09/20/14 1010  BNP 113.7*    ProBNP (last 3 results)  Recent Labs  01/28/14 1132 05/15/14 2119 05/17/14 1446  PROBNP 785.1* 892.3* 690.8*    CBG: No results for input(s): GLUCAP in the last 168 hours.  Recent Results (from the past 240 hour(s))  MRSA PCR Screening     Status: None   Collection Time: 09/20/14  6:42 PM  Result Value Ref Range Status   MRSA by  PCR NEGATIVE NEGATIVE Final    Comment:        The GeneXpert MRSA Assay (FDA approved for NASAL specimens only), is one component of a comprehensive MRSA colonization surveillance program. It is not intended to diagnose MRSA infection nor to guide or monitor treatment for MRSA infections.      Studies: Dg Chest Port 1 View  09/20/2014   CLINICAL DATA:  Sudden onset of shortness of breath.  EXAM: PORTABLE CHEST - 1 VIEW  COMPARISON:  09/20/2014 at 10:29 a.m.  FINDINGS: Interstitial accentuation is again noted along with airspace opacity at the right lung base. Improved conspicuity of the right hemidiaphragm. Mild airspace opacity at the left lung base.  Atherosclerotic calcification of the aortic arch. Upper normal heart size.   IMPRESSION: 1. Similar appearance to earlier today, bilateral interstitial opacity favoring interstitial edema, and bibasilar airspace opacities which could be from confluent edema or pneumonia.   Electronically Signed   By: Herbie Baltimore M.D.   On: 09/20/2014 18:37   Dg Chest Portable 1 View  09/20/2014   CLINICAL DATA:  Shortness of breath.  EXAM: PORTABLE CHEST - 1 VIEW  COMPARISON:  May 15, 2014.  FINDINGS: Stable cardiomediastinal silhouette. Patient is rotated to the left. No pneumothorax or significant pleural effusion is noted. Mild bilateral perihilar and basilar interstitial densities are noted consistent with pulmonary edema. Bony thorax is intact.  IMPRESSION: Findings consistent with mild bilateral pulmonary edema.   Electronically Signed   By: Roque Lias M.D.   On: 09/20/2014 11:25    Scheduled Meds: . amLODipine  10 mg Oral Daily  . feeding supplement (ENSURE COMPLETE)  237 mL Oral BID BM  . furosemide  40 mg Intravenous Daily  . heparin  5,000 Units Subcutaneous 3 times per day  . ipratropium-albuterol  3 mL Nebulization QID  . levothyroxine  125 mcg Oral QAC breakfast  . pantoprazole  40 mg Oral Daily  . potassium chloride SA  20 mEq Oral Daily  . pravastatin  40 mg Oral QHS  . sodium chloride  3 mL Intravenous Q12H  . sodium chloride  3 mL Intravenous Q12H   Continuous Infusions:   Active Problems:   Acute respiratory failure with hypoxia   Dyslipidemia   Essential hypertension, benign   Hypothyroidism   COPD exacerbation   GERD (gastroesophageal reflux disease)   Acute on chronic diastolic CHF (congestive heart failure), NYHA class 1   CKD (chronic kidney disease), stage II   Acute diastolic heart failure   CHF (congestive heart failure)   CHIU, STEPHEN K  [facility] Pager [phone]. If 7PM-7AM, please contact night-coverage at [email or web address], password [facility] 09/21/2014, 2:54 PM  LOS: 1 day

## 2014-09-21 NOTE — Progress Notes (Signed)
Pt resting comfortably in bed . O2 sat on l /m [place] remained > 90"s to 100 % desats to 80's when off O2 . On continous O2 monitoring

## 2014-09-21 NOTE — Progress Notes (Signed)
Shift Event: Paged due to elevated Troponin of 0.62, pt asymptomatic. Will continue to cycle Update: Troponin downtrending 0.62>>0.48, pt asymptomatic  Illa LevelSahar Osman [facility]

## 2014-09-22 ENCOUNTER — Other Ambulatory Visit: Payer: Self-pay

## 2014-09-22 ENCOUNTER — Inpatient Hospital Stay (HOSPITAL_COMMUNITY): Payer: Medicare Other

## 2014-09-22 DIAGNOSIS — R7989 Other specified abnormal findings of blood chemistry: Secondary | ICD-10-CM

## 2014-09-22 DIAGNOSIS — I10 Essential (primary) hypertension: Secondary | ICD-10-CM

## 2014-09-22 DIAGNOSIS — J441 Chronic obstructive pulmonary disease with (acute) exacerbation: Secondary | ICD-10-CM

## 2014-09-22 DIAGNOSIS — I5031 Acute diastolic (congestive) heart failure: Secondary | ICD-10-CM

## 2014-09-22 DIAGNOSIS — I5033 Acute on chronic diastolic (congestive) heart failure: Principal | ICD-10-CM

## 2014-09-22 DIAGNOSIS — J9601 Acute respiratory failure with hypoxia: Secondary | ICD-10-CM

## 2014-09-22 DIAGNOSIS — E785 Hyperlipidemia, unspecified: Secondary | ICD-10-CM

## 2014-09-22 LAB — TROPONIN I: Troponin I: 0.38 ng/mL — ABNORMAL HIGH (ref <0.031)

## 2014-09-22 LAB — CBC
HCT: 37.1 % (ref 36.0–46.0)
Hemoglobin: 11.8 g/dL — ABNORMAL LOW (ref 12.0–15.0)
MCH: 29.1 pg (ref 26.0–34.0)
MCHC: 31.8 g/dL (ref 30.0–36.0)
MCV: 91.6 fL (ref 78.0–100.0)
PLATELETS: 172 10*3/uL (ref 150–400)
RBC: 4.05 MIL/uL (ref 3.87–5.11)
RDW: 14.3 % (ref 11.5–15.5)
WBC: 5.8 10*3/uL (ref 4.0–10.5)

## 2014-09-22 LAB — BASIC METABOLIC PANEL
ANION GAP: 9 (ref 5–15)
BUN: 30 mg/dL — ABNORMAL HIGH (ref 6–23)
CALCIUM: 8.7 mg/dL (ref 8.4–10.5)
CO2: 32 mmol/L (ref 19–32)
CREATININE: 0.77 mg/dL (ref 0.50–1.10)
Chloride: 99 mmol/L (ref 96–112)
GFR calc Af Amer: 88 mL/min — ABNORMAL LOW (ref >90)
GFR, EST NON AFRICAN AMERICAN: 76 mL/min — AB (ref >90)
Glucose, Bld: 108 mg/dL — ABNORMAL HIGH (ref 70–99)
Potassium: 3.9 mmol/L (ref 3.5–5.1)
SODIUM: 140 mmol/L (ref 135–145)

## 2014-09-22 MED ORDER — CARVEDILOL 3.125 MG PO TABS
3.1250 mg | ORAL_TABLET | Freq: Two times a day (BID) | ORAL | Status: DC
  Administered 2014-09-22 – 2014-09-25 (×6): 3.125 mg via ORAL
  Filled 2014-09-22 (×8): qty 1

## 2014-09-22 NOTE — Plan of Care (Signed)
Problem: Phase I Progression Outcomes Goal: Pain controlled with appropriate interventions Outcome: Progressing Patient had severe back pain. Pain level a 10 on 1-10 scale. Received morphine per PRN order. Pain medication effective per patient stated the pain medication worked when asked. Goal: EF % per last Echo/documented,Core Reminder form on chart Outcome: Completed/Met Date Met:  09/22/14 EF -60-65% on 05/2014 Goal: Voiding-avoid urinary catheter unless indicated Outcome: Not Met (add Reason) Patient has foley catheter for aggressive diuresis

## 2014-09-22 NOTE — Progress Notes (Signed)
Orthopedic Tech Progress Note Patient Details:  [name] [name] 12/19/1928 [id number] Brace order completed by bio-tech vendor. Patient ID: [name] [name], female   DOB: 05/08/1929, 81 y.o.   MRN: [id number]   [name] [name], [name] [name], 3:30 PM

## 2014-09-22 NOTE — Progress Notes (Signed)
Orthopedic Tech Progress Note Patient Details:  [name] AreaMurl Virginia Finan 01/26/1929 [id number]  Patient ID: [name] [name], female   DOB: 09/17/1928, 81 y.o.   MRN: [id number] Called in bio-tech brace order; spoke with [name] [name]  Zaxton Angerer 09/22/2014, 11:54 AM

## 2014-09-22 NOTE — Progress Notes (Addendum)
Patient Demographics  Murl Letitia LibraVirginia Sedberry, is a 81 y.o. female, DOB - 12/12/1928, ZOX:[id number]  Admit date - 09/20/2014   Admitting Physician Dewayne ShorterShanker Levora DredgeM Ghimire, MD  Outpatient Primary MD for the patient is Julian HySOUTH,STEPHEN ALAN, MD  LOS - 2   Chief Complaint  Patient presents with  . Shortness of Breath        Subjective:   Murl [place] today has, No headache, No chest pain, No abdominal pain - No Nausea, No new weakness tingling or numbness, No Cough - SOB. ++ve Low back pain  Assessment & Plan     1. Acute respiratory failure secondary to acute on chronic diastolic CHF EF 65% on recent echogram - improving with IV Lasix, salt and fluid restriction, supportive care. We'll add low-dose beta blocker. Monitor. Cardiology consulted. We will add pulmonary toiletry and chest PT as well.   2. Dyslipidemia. On statin continue.    3. Mild troponin elevation likely due to #1 above. Troponin rise is not in ACS pattern. On aspirin, statin, low-dose beta blocker will be added for secondary prevention. One-time cardiology input.    4. Low back pain after a fall that she sustained at nursing home. X-ray feel T12 superior endplate fracture with 30% height loss, poor candidate for surgical resection, supportive care, will upright TL SO brace. We'll discuss with neurosurgery over the phone. No lower extremity weakness or signs of cauda equina.    5. Hypothyroidism. On Synthroid continue.    6. GERD. On PPI.    Code Status: DNR  Family Communication: none present  Disposition Plan: SNF   Procedures   TTE 05-2014  - Left ventricle: The cavity size was normal. Wall thickness wasincreased in a pattern of moderate LVH. Systolic function wasnormal. The estimated ejection fraction  was in the range of 60%to 65%. Doppler parameters are consistent with abnormal leftventricular relaxation (grade 1 diastolic dysfunction). - Aortic valve: Mildly calcified annulus. Mildly thickenedleaflets. Valve area (VTI): 2.47 cm^2. Valve area (Vmax): 2.26cm^2. - Atrial septum: No defect or patent foramen ovale was identified.   Consults  N. Surg over the phone, Cards   Medications  Scheduled Meds: . amLODipine  10 mg Oral Daily  . feeding supplement (ENSURE COMPLETE)  237 mL Oral BID BM  . furosemide  40 mg Intravenous Daily  . guaiFENesin  600 mg Oral BID  . heparin  5,000 Units Subcutaneous 3 times per day  . ipratropium-albuterol  3 mL Nebulization QID  . levothyroxine  125 mcg Oral QAC breakfast  . pantoprazole  40 mg Oral Daily  . potassium chloride SA  20 mEq Oral Daily  . pravastatin  40 mg Oral QHS  . sodium chloride  3 mL Intravenous Q12H  . sodium chloride  3 mL Intravenous Q12H   Continuous Infusions:  PRN Meds:.sodium chloride, albuterol, aspirin, morphine injection, ondansetron **OR** ondansetron (ZOFRAN) IV, sodium chloride  DVT Prophylaxis    Heparin   Lab Results  Component Value Date   PLT 172 09/22/2014    Antibiotics    Anti-infectives    None          Objective:   Filed Vitals:   09/21/14 2031 09/22/14 0130 09/22/14 0530 09/22/14 0728  BP:  124/49  141/63 120/53  Pulse:  86 76 80  Temp:  98.5 F (36.9 C) 98.1 F (36.7 C) 98.8 F (37.1 C)  TempSrc:  Oral Oral Oral  Resp:  16 18 16   Weight:   57.586 kg (126 lb 15.3 oz)   SpO2: 95% 95% 96% 96%    Wt Readings from Last 3 Encounters:  09/22/14 57.586 kg (126 lb 15.3 oz)  05/19/14 61 kg (134 lb 7.7 oz)  12/23/12 55.792 kg (123 lb)     Intake/Output Summary (Last 24 hours) at 09/22/14 1053 Last data filed at 09/22/14 0853  Gross per 24 hour  Intake    900 ml  Output   1100 ml  Net   -200 ml     Physical Exam  Awake Alert, Oriented X 3, No new F.N deficits, Normal  affect .AT,PERRAL Supple Neck,No JVD, No cervical lymphadenopathy appriciated.  Symmetrical Chest wall movement, Good air movement bilaterally, Rales bilaterally RRR,No Gallops,Rubs or new Murmurs, No Parasternal Heave +ve B.Sounds, Abd Soft, No tenderness, No organomegaly appriciated, No rebound - guarding or rigidity. No Cyanosis, Clubbing or edema, No new Rash or bruise      Data Review   Micro Results Recent Results (from the past 240 hour(s))  Urine culture     Status: None   Collection Time: 09/20/14 12:27 PM  Result Value Ref Range Status   Specimen Description URINE, CATHETERIZED  Final   Special Requests NONE  Final   Colony Count NO GROWTH Performed at [facility]   Final   Culture NO GROWTH Performed at [facility]   Final   Report Status 09/21/2014 FINAL  Final  MRSA PCR Screening     Status: None   Collection Time: 09/20/14  6:42 PM  Result Value Ref Range Status   MRSA by PCR NEGATIVE NEGATIVE Final    Comment:        The GeneXpert MRSA Assay (FDA approved for NASAL specimens only), is one component of a comprehensive MRSA colonization surveillance program. It is not intended to diagnose MRSA infection nor to guide or monitor treatment for MRSA infections.     Radiology Reports Dg Thoracic Spine 2 View  09/22/2014   CLINICAL DATA:  81 year old female with thoracic spine pain  EXAM: THORACIC SPINE - 2 VIEW  COMPARISON:  Prior chest x-ray 09/20/2014 and 05/15/2014  FINDINGS: The bones are osteopenic. Compression fracture involving the superior endplate of T12. There is approximately 30% height loss anteriorly. Interval development of progressive exaggeration of the thoracic curvature. This finding was not present on the prior two-view chest x-ray from October of 2015. Is difficult to tell if the fracture was present on the more recent chest x-rays from several days previously. The remaining vertebral body heights are maintained. Patchy  airspace opacity in the left lower lobe favored to reflect atelectasis.  IMPRESSION: 1. Compared to May 15, 2014 interval development of an acute or subacute compression fracture involving the superior endplate of T12 with approximately 30% height loss. No evidence of posterior retropulsion. If confirmation of acuity is clinically warranted, consider further evaluation with MRI of the thoracic spine. 2. Incompletely imaged patchy left lower lobe opacity may reflect atelectasis or infiltrate. 3. Aortic atherosclerosis.   Electronically Signed   By: Malachy Moan M.D.   On: 09/22/2014 10:03   Dg Lumbar Spine Complete  09/22/2014   CLINICAL DATA:  Lower back pain.  EXAM: LUMBAR SPINE - COMPLETE 4+ VIEW  COMPARISON:  None.  FINDINGS: Diffuse osteopenia is noted. No fracture or significant spondylolisthesis is noted. Moderate degenerative disc disease is noted at L3-4 and L4-5. Mild compression deformity of T11 vertebral body is noted consistent with fracture of indeterminate age. No fracture or spondylolisthesis is noted in the lumbar spine.  IMPRESSION: Mild compression deformity of T11 vertebral body is noted consistent with fracture of indeterminate age. Moderate multilevel degenerative disc disease is noted in the lower lumbar spine. No acute abnormality seen in the lumbar spine.   Electronically Signed   By: Lupita Raider, M.D.   On: 09/22/2014 10:33   Dg Chest Port 1 View  09/20/2014   CLINICAL DATA:  Sudden onset of shortness of breath.  EXAM: PORTABLE CHEST - 1 VIEW  COMPARISON:  09/20/2014 at 10:29 a.m.  FINDINGS: Interstitial accentuation is again noted along with airspace opacity at the right lung base. Improved conspicuity of the right hemidiaphragm. Mild airspace opacity at the left lung base.  Atherosclerotic calcification of the aortic arch. Upper normal heart size.  IMPRESSION: 1. Similar appearance to earlier today, bilateral interstitial opacity favoring interstitial edema, and bibasilar  airspace opacities which could be from confluent edema or pneumonia.   Electronically Signed   By: Herbie Baltimore M.D.   On: 09/20/2014 18:37   Dg Chest Portable 1 View  09/20/2014   CLINICAL DATA:  Shortness of breath.  EXAM: PORTABLE CHEST - 1 VIEW  COMPARISON:  May 15, 2014.  FINDINGS: Stable cardiomediastinal silhouette. Patient is rotated to the left. No pneumothorax or significant pleural effusion is noted. Mild bilateral perihilar and basilar interstitial densities are noted consistent with pulmonary edema. Bony thorax is intact.  IMPRESSION: Findings consistent with mild bilateral pulmonary edema.   Electronically Signed   By: Roque Lias M.D.   On: 09/20/2014 11:25     CBC  Recent Labs Lab 09/20/14 1010 09/21/14 0805 09/22/14 0305  WBC 7.9 5.9 5.8  HGB 12.8 12.2 11.8*  HCT 41.2 38.8 37.1  PLT 145* 159 172  MCV 94.9 93.5 91.6  MCH 29.5 29.4 29.1  MCHC 31.1 31.4 31.8  RDW 14.6 14.4 14.3  LYMPHSABS 0.7  --   --   MONOABS 0.7  --   --   EOSABS 0.3  --   --   BASOSABS 0.0  --   --     Chemistries   Recent Labs Lab 09/20/14 1010 09/21/14 0805 09/22/14 0305  NA 141 141 140  K 4.0 3.9 3.9  CL 105 102 99  CO2 29 31 32  GLUCOSE 142* 105* 108*  BUN 30* 29* 30*  CREATININE 0.86 0.80 0.77  CALCIUM 8.8 8.7 8.7  AST 17 14  --   ALT 12 11  --   ALKPHOS 77 72  --   BILITOT 0.5 0.8  --    ------------------------------------------------------------------------------------------------------------------ estimated creatinine clearance is 46 mL/min (by C-G formula based on Cr of 0.77). ------------------------------------------------------------------------------------------------------------------ No results for input(s): HGBA1C in the last 72 hours. ------------------------------------------------------------------------------------------------------------------ No results for input(s): CHOL, HDL, LDLCALC, TRIG, CHOLHDL, LDLDIRECT in the last 72  hours. ------------------------------------------------------------------------------------------------------------------ No results for input(s): TSH, T4TOTAL, T3FREE, THYROIDAB in the last 72 hours.  Invalid input(s): FREET3 ------------------------------------------------------------------------------------------------------------------ No results for input(s): VITAMINB12, FOLATE, FERRITIN, TIBC, IRON, RETICCTPCT in the last 72 hours.  Coagulation profile No results for input(s): INR, PROTIME in the last 168 hours.  No results for input(s): DDIMER in the last 72 hours.  Cardiac Enzymes  Recent Labs Lab 09/21/14 1600 09/21/14 2101 09/22/14  0305  TROPONINI 0.37* 0.33* 0.38*   ------------------------------------------------------------------------------------------------------------------ Invalid input(s): POCBNP     Time Spent in minutes  35   SINGH,PRASHANT K M.D on 09/22/2014 at 10:53 AM  Between 7am to 7pm - Pager - [phone]  After 7pm go to [email or web address] - password [facility]  [facility] Office  [phone]

## 2014-09-22 NOTE — Consult Note (Signed)
CARDIOLOGY CONSULT NOTE   Patient ID: [name] [name] MRN: [id number], DOB/AGE: 12/27/1928   Admit date: 09/20/2014 Date of Consult: 09/22/2014  Primary Physician: Julian Hy, MD Primary Cardiologist: Macario Carls   Reason for consult:  CHF  Problem List  Past Medical History  Diagnosis Date  . Hypertension   . Hyperlipidemia   . Thyroid disease   . Hypokalemia   . Vitamin D deficiency   . COPD (chronic obstructive pulmonary disease)   . CHF (congestive heart failure)     Past Surgical History  Procedure Laterality Date  . Cholecystectomy    . Abdominal hysterectomy      Allergies  No Known Allergies  HPI   [name] [name] is a 81 y.o. female, resident of skilled nursing facility/DO NOT RESUSCITATE with known history of O2 dependent COPD and diastolic heart failure as well as hypothyroidism and dyslipidemia. Was sent to the ER from the skilled nursing facility for reported shortness of breath. Her room air saturations were 70-80% when assessed in the ER. She required BiPAP at presentation. Her chest x-ray was consistent with edema vs pneumonia. She was given Lasix IV in the ER. She had minimal elevation in her BNP. An attempt was made to wean her to nasal cannula from BiPAP but this failed. According to the nursing home records the patient recently had received Rocephin for suspected urinary tract infection. On admission, the patient was on BiPAP, currently on O2 with nasal canula.  Today, the patient complains of back pain after fall, she denies any chest pain or significant SOB. She states that she has been having a productive cough, no orthopnea or PND, no LE edema. She is somewhat poor historian. She was found to have elevated troponin.    Inpatient Medications  . carvedilol  3.125 mg Oral BID WC  . feeding supplement (ENSURE COMPLETE)  237 mL Oral BID BM  . furosemide  40 mg Intravenous Daily  . heparin  5,000 Units Subcutaneous 3  times per day  . ipratropium-albuterol  3 mL Nebulization QID  . levothyroxine  125 mcg Oral QAC breakfast  . pantoprazole  40 mg Oral Daily  . potassium chloride SA  20 mEq Oral Daily  . pravastatin  40 mg Oral QHS  . sodium chloride  3 mL Intravenous Q12H  . sodium chloride  3 mL Intravenous Q12H    Family History History reviewed. No pertinent family history.   Social History History   Social History  . Marital Status: Single    Spouse Name: N/A  . Number of Children: N/A  . Years of Education: N/A   Occupational History  . Not on file.   Social History Main Topics  . Smoking status: Former Smoker    Types: Cigarettes  . Smokeless tobacco: Never Used     Comment: quit smoking years ago  . Alcohol Use: No  . Drug Use: No  . Sexual Activity: Not Currently    Birth Control/ Protection: Post-menopausal   Other Topics Concern  . Not on file   Social History Narrative    Review of Systems  General:  No chills, fever, night sweats or weight changes.  Cardiovascular:  No chest pain, dyspnea on exertion, edema, orthopnea, palpitations, paroxysmal nocturnal dyspnea. Dermatological: No rash, lesions/masses Respiratory: No cough, dyspnea Urologic: No hematuria, dysuria Abdominal:   No nausea, vomiting, diarrhea, bright red blood per rectum, melena, or hematemesis Neurologic:  No visual changes, wkns, changes in mental status.  All other systems reviewed and are otherwise negative except as noted above.  Physical Exam  Blood pressure 120/53, pulse 80, temperature 98.8 F (37.1 C), temperature source Oral, resp. rate 16, weight 126 lb 15.3 oz (57.586 kg), SpO2 96 %.  General: Pleasant, NAD Psych: Normal affect. Neuro: Alert and oriented X 3. Moves all extremities spontaneously. HEENT: Normal  Neck: Supple without bruits or JVD. Lungs:  Resp regular and unlabored, rales at bases Heart: RRR no s3, s4, or 2/6 systolic murmus. Abdomen: Soft, non-tender, non-distended, BS  + x 4.  Extremities: No clubbing, cyanosis or edema. DP/PT/Radials 2+ and equal bilaterally.  Labs  Recent Labs  09/21/14 0805 09/21/14 1600 09/21/14 2101 09/22/14 0305  TROPONINI 0.44* 0.37* 0.33* 0.38*   Lab Results  Component Value Date   WBC 5.8 09/22/2014   HGB 11.8* 09/22/2014   HCT 37.1 09/22/2014   MCV 91.6 09/22/2014   PLT 172 09/22/2014    Recent Labs Lab 09/21/14 0805 09/22/14 0305  NA 141 140  K 3.9 3.9  CL 102 99  CO2 31 32  BUN 29* 30*  CREATININE 0.80 0.77  CALCIUM 8.7 8.7  PROT 5.3*  --   BILITOT 0.8  --   ALKPHOS 72  --   ALT 11  --   AST 14  --   GLUCOSE 105* 108*   Radiology/Studies  Dg Chest Port 1 View  09/20/2014   CLINICAL DATA:  Sudden onset of shortness of breath.  IMPRESSION: 1. Similar appearance to earlier today, bilateral interstitial opacity favoring interstitial edema, and bibasilar airspace opacities which could be from confluent edema or pneumonia.   Electronically Signed   By: Herbie BaltimoreWalt  Liebkemann M.D.   On: 09/20/2014 18:37   Dg Chest Portable 1 View  09/20/2014   CLINICAL DATA:  Shortness of breath.  IMPRESSION: Findings consistent with mild bilateral pulmonary edema.   Electronically Signed   By: Roque LiasJames  Green M.D.   On: 09/20/2014 11:25   Echocardiogram - 05/16/2014 Left ventricle: The cavity size was normal. Wall thickness was increased in a pattern of moderate LVH. Systolic function was normal. The estimated ejection fraction was in the range of 60% to 65%. Doppler parameters are consistent with abnormal left ventricular relaxation (grade 1 diastolic dysfunction). - Aortic valve: Mildly calcified annulus. Mildly thickened leaflets. Valve area (VTI): 2.47 cm^2. Valve area (Vmax): 2.26 cm^2. - Atrial septum: No defect or patent foramen ovale was identified.  ECG: SR, RBBB, new Q wave in the inferior leads     ASSESSMENT AND PLAN  81 year old female with )2 dependent COPD admitted with respiratory failure, found  to have elevated troponin and CHF  1. Acute on chronic diastolic CHF - the patient was diuresed 3 lbs and is only mildly fluid overloaded on physical exam. I would continue lasix 40 mg po daily and monitor kidney function.   2. Elevated troponin -  0.44* 0.37* 0.33* 0.38*   This is most probably sec to CHF, however there are new Q waves in the inferior leads. Considering patient's baseline comorbidities, DNR status, she is not an ideal cath candidate. I would check echocardiogram and if there are new wall motion abnormalities when compared to the echo from 05/2014 I would schedule a stress test. Otherwise just treat medically.  Continue carvedilo, pravastatin, add aspirin.   3. HTN - controlled   4. HLP - on pravastatin   Signed, Lars MassonNELSON, Luie Laneve H, MD, [facility] 09/22/2014, 11:32 AM

## 2014-09-22 NOTE — Progress Notes (Signed)
Notified hospitalist on call that patient had very thick phlegm last night. Mucinex (generic) was ordered and given per doctor's order. Mucinex was effective in breaking up the phlegm. Will continue to monitor patient to end of shift.

## 2014-09-22 NOTE — Progress Notes (Signed)
INITIAL NUTRITION ASSESSMENT  DOCUMENTATION CODES Per approved criteria  -Not Applicable   INTERVENTION: Continue Ensure Complete po BID, each supplement provides 350 kcal and 13 grams of protein Provide Magic Cup ice cream once daily RD to continue to monitor for PO adequacy  NUTRITION DIAGNOSIS: Inadequate oral intake related to poor appetite as evidenced by <50% meal completion.   Goal: Pt to meet >/= 90% of their estimated nutrition needs   Monitor:  PO intake, supplement acceptance, weight trend, labs  Reason for Assessment: Malnutrition Screening Tool, score of 3  81 y.o. female  Admitting Dx: <principal problem not specified>  ASSESSMENT: 81 y.o. female, resident of skilled nursing facility with known history of O2 dependent COPD and diastolic heart failure as well as hypothyroidism and dyslipidemia. Was sent to the ER from the skilled nursing facility for reported shortness of breath.   Pt asleep at time of visit. Per malnutrition screening tool, pt reported eating poorly due to a decreased appetite. Per nursing notes, pt ate 10-25% of meals yesterday, 50% of breakfast this morning. Per weight history, pt's weight appears stable around 120 lbs for past year.   Labs reviewed.  Height: Ht Readings from Last 1 Encounters:  05/16/14  (1.626 m)    Weight: Wt Readings from Last 1 Encounters:  09/22/14 126 lb 15.3 oz (57.586 kg)    Ideal Body Weight: 120 lbs  % Ideal Body Weight: 105%  Wt Readings from Last 10 Encounters:  09/22/14 126 lb 15.3 oz (57.586 kg)  05/19/14 134 lb 7.7 oz (61 kg)  12/23/12 123 lb (55.792 kg)  11/05/12 120 lb (54.432 kg)  03/11/12 131 lb 2.8 oz (59.5 kg)    Usual Body Weight: unknown  % Usual Body Weight: NA  BMI:  Body mass index is 21.78 kg/(m^2).  Estimated Nutritional Needs: Kcal: 1400-1600 Protein: 70-80 grams Fluid: 1.4-1.6 L/day  Skin: WDL  Diet Order: Diet Heart  EDUCATION NEEDS: -No education needs  identified at this time   Intake/Output Summary (Last 24 hours) at 09/22/14 1431 Last data filed at 09/22/14 1401  Gross per 24 hour  Intake    843 ml  Output   1900 ml  Net  -1057 ml    Last BM: PTA  Labs:   Recent Labs Lab 09/20/14 1010 09/21/14 0805 09/22/14 0305  NA 141 141 140  K 4.0 3.9 3.9  CL 105 102 99  CO2 29 31 32  BUN 30* 29* 30*  CREATININE 0.86 0.80 0.77  CALCIUM 8.8 8.7 8.7  GLUCOSE 142* 105* 108*    CBG (last 3)  No results for input(s): GLUCAP in the last 72 hours.  Scheduled Meds: . carvedilol  3.125 mg Oral BID WC  . feeding supplement (ENSURE COMPLETE)  237 mL Oral BID BM  . furosemide  40 mg Intravenous Daily  . heparin  5,000 Units Subcutaneous 3 times per day  . ipratropium-albuterol  3 mL Nebulization QID  . levothyroxine  125 mcg Oral QAC breakfast  . pantoprazole  40 mg Oral Daily  . potassium chloride SA  20 mEq Oral Daily  . pravastatin  40 mg Oral QHS  . sodium chloride  3 mL Intravenous Q12H  . sodium chloride  3 mL Intravenous Q12H    Continuous Infusions:   Past Medical History  Diagnosis Date  . Hypertension   . Hyperlipidemia   . Thyroid disease   . Hypokalemia   . Vitamin D deficiency   . COPD (chronic  obstructive pulmonary disease)   . CHF (congestive heart failure)     Past Surgical History  Procedure Laterality Date  . Cholecystectomy    . Abdominal hysterectomy      Ian Malkineanne Barnett RD, LDN Inpatient Clinical Dietitian Pager: [phone] After Hours Pager: [phone]

## 2014-09-23 DIAGNOSIS — I4510 Unspecified right bundle-branch block: Secondary | ICD-10-CM | POA: Diagnosis present

## 2014-09-23 DIAGNOSIS — S22000A Wedge compression fracture of unspecified thoracic vertebra, initial encounter for closed fracture: Secondary | ICD-10-CM | POA: Diagnosis present

## 2014-09-23 DIAGNOSIS — R7989 Other specified abnormal findings of blood chemistry: Secondary | ICD-10-CM

## 2014-09-23 DIAGNOSIS — R778 Other specified abnormalities of plasma proteins: Secondary | ICD-10-CM | POA: Diagnosis present

## 2014-09-23 DIAGNOSIS — J9621 Acute and chronic respiratory failure with hypoxia: Secondary | ICD-10-CM

## 2014-09-23 DIAGNOSIS — I509 Heart failure, unspecified: Secondary | ICD-10-CM

## 2014-09-23 MED ORDER — POLYETHYLENE GLYCOL 3350 17 G PO PACK
17.0000 g | PACK | Freq: Two times a day (BID) | ORAL | Status: DC
  Administered 2014-09-23 – 2014-09-25 (×5): 17 g via ORAL
  Filled 2014-09-23 (×7): qty 1

## 2014-09-23 MED ORDER — FUROSEMIDE 10 MG/ML IJ SOLN
40.0000 mg | Freq: Two times a day (BID) | INTRAMUSCULAR | Status: DC
  Administered 2014-09-23 – 2014-09-24 (×2): 40 mg via INTRAVENOUS
  Filled 2014-09-23 (×4): qty 4

## 2014-09-23 MED ORDER — DOCUSATE SODIUM 100 MG PO CAPS
200.0000 mg | ORAL_CAPSULE | Freq: Two times a day (BID) | ORAL | Status: DC
  Administered 2014-09-23 (×2): 200 mg via ORAL
  Filled 2014-09-23 (×4): qty 2

## 2014-09-23 NOTE — Progress Notes (Signed)
Subjective:  Some improvement in SOB   Objective:  Vital Signs in the last 24 hours: Temp:  [97.6 F (36.4 C)-98.6 F (37 C)] 98.1 F (36.7 C) (02/11 0520) Pulse Rate:  [77-91] 77 (02/11 0913) Resp:  [18-20] 18 (02/11 0913) BP: (108-131)/(43-56) 110/53 mmHg (02/11 0913) SpO2:  [90 %-97 %] 91 % (02/11 0921) Weight:  [126 lb 15.8 oz (57.6 kg)] 126 lb 15.8 oz (57.6 kg) (02/11 0520)  Intake/Output from previous day:  Intake/Output Summary (Last 24 hours) at 09/23/14 1019 Last data filed at 09/23/14 0900  Gross per 24 hour  Intake    713 ml  Output   1400 ml  Net   -687 ml    Physical Exam: General appearance: alert, cooperative, cachectic, no distress and pale Lungs: scattered rhonchi Heart: regular rate and rhythm Extremities: no edema   Rate: 78  Rhythm: normal sinus rhythm  Lab Results:  Recent Labs  09/21/14 0805 09/22/14 0305  WBC 5.9 5.8  HGB 12.2 11.8*  PLT 159 172    Recent Labs  09/21/14 0805 09/22/14 0305  NA 141 140  K 3.9 3.9  CL 102 99  CO2 31 32  GLUCOSE 105* 108*  BUN 29* 30*  CREATININE 0.80 0.77    Recent Labs  09/21/14 2101 09/22/14 0305  TROPONINI 0.33* 0.38*   No results for input(s): INR in the last 72 hours.  Imaging: Imaging results have been reviewed  Cardiac Studies: Echo 05/16/14 Study Conclusions  - Left ventricle: The cavity size was normal. Wall thickness was increased in a pattern of moderate LVH. Systolic function was normal. The estimated ejection fraction was in the range of 60% to 65%. Doppler parameters are consistent with abnormal left ventricular relaxation (grade 1 diastolic dysfunction). - Aortic valve: Mildly calcified annulus. Mildly thickened leaflets. Valve area (VTI): 2.47 cm^2. Valve area (Vmax): 2.26 cm^2. - Atrial septum: No defect    Assessment/Plan:  81 y.o. pleasant female, resident of skilled nursing facility , DNR, with known history of O2 dependent COPD and diastolic  heart failure as well as hypothyroidism, severe DJD, and dyslipidemia. Was sent to the ER 09/20/14  from the skilled nursing facility for shortness of breath. Her room air saturations were 70-80% when assessed in the ER. She required BiPAP at presentation. Her chest x-ray was consistent with edema vs pneumonia. She had an elevated Troponin and new inferior Q wave on EKG. She has improved with 2.5L diuresis and it's felt she has acute on chronic diastolic CHF.   Principal Problem:   Acute on chronic respiratory failure with hypoxia Active Problems:   COPD exacerbation   Acute on chronic diastolic CHF (congestive heart failure)   Essential hypertension, benign   Chronic diastolic heart failure   CKD (chronic kidney disease), stage II   Troponin level elevated-felt to be secondary to acute resp failure   Dyslipidemia   Hypothyroidism   GERD (gastroesophageal reflux disease)   Compression fracture of body of thoracic vertebra-wears a brace   PLAN: Plan is for conservative Rx. Repeat Echo pending, no further work up unless significant change in her LVF. Probably can decrease Lasix and change to po- [name] [name] to see.   [name] [name] Beeper [phone] 09/23/2014, 10:19 AM   I have seen and examined the patient along with [name] [name].  I have reviewed the chart, notes and new data.  I agree with PA's note.  Suspect that the inferior Q waves may be due to  intraventricular conduction delay. "Plateau" elevation in troponin is more consistent with CHF than a true acute coronary event. Echo pending.  PLAN: If echo does not show a new wall motion abnormality, will not pursue further cardiac workup.  [name] FairMihai Samik Balkcom, [name] [name], [facility] KingslandFACC [facility] [phone] 09/23/2014, 10:34 AM

## 2014-09-23 NOTE — Evaluation (Signed)
Clinical/Bedside Swallow Evaluation Patient Details  Name: Mauricia AreaMurl Virginia Grein MRN: [id number] Date of Birth: 06/09/1929  Today's Date: 09/23/2014 Time: SLP Start Time (ACUTE ONLY): 1535 SLP Stop Time (ACUTE ONLY): 1552 SLP Time Calculation (min) (ACUTE ONLY): 17 min  Past Medical History:  Past Medical History  Diagnosis Date  . Hypertension   . Hyperlipidemia   . Thyroid disease   . Hypokalemia   . Vitamin D deficiency   . COPD (chronic obstructive pulmonary disease)   . CHF (congestive heart failure)    Past Surgical History:  Past Surgical History  Procedure Laterality Date  . Cholecystectomy    . Abdominal hysterectomy     HPI:  81 y.o. female, resident of skilled nursing facility with known history of O2 dependent COPD, GERD, and diastolic heart failure as well as hypothyroidism and dyslipidemia. Was sent to the ER from the skilled nursing facility for reported shortness of breath. Her room air saturations were 70-80% when assessed in the ER. She required BiPAP at presentation. Her chest x-ray was consistent with edema vs PNA. She was given Lasix IV in the ER. Xray revealed T12 fx sustained in a fall at the SNF.   Assessment / Plan / Recommendation Clinical Impression  Patient presents with a suspected baseline esophageal dysphagia with c/o globus with dry solid textures as well as a moderate pharyngeal dysphagia resulting from acute illness with SOB. S/s of decreased airway protection noted with with thin liquids. S/s of aspiration eliminated when cued for small single cup sips rather than straw use. Additionally, soft, moistened solids orally transited with less respiratory effort than regular texture solids, increasing both safety and efficiency of swallow. Educated patient regarding rationale for use of compensatory strategies/aspiration pecautions. Will downgrade to conservative diet and f/u closely to monitor for need for instrumental testing prior to d/c.      Aspiration Risk  Moderate    Diet Recommendation Dysphagia 2 (Fine chop);Thin liquid   Liquid Administration via: Cup;No straw Medication Administration: Crushed with puree Supervision: Patient able to self feed;Full supervision/cueing for compensatory strategies Compensations: Slow rate;Small sips/bites (take frequent rest breaks to control SOB) Postural Changes and/or Swallow Maneuvers: Seated upright 90 degrees;Upright 30-60 min after meal    Other  Recommendations Oral Care Recommendations: Oral care BID   Follow Up Recommendations  Skilled Nursing facility    Frequency and Duration min 2x/week  1 week   Pertinent Vitals/Pain n/a     Swallow Study    General HPI: 81 y.o. female, resident of skilled nursing facility with known history of O2 dependent COPD, GERD, and diastolic heart failure as well as hypothyroidism and dyslipidemia. Was sent to the ER from the skilled nursing facility for reported shortness of breath. Her room air saturations were 70-80% when assessed in the ER. She required BiPAP at presentation. Her chest x-ray was consistent with edema vs PNA. She was given Lasix IV in the ER. Xray revealed T12 fx sustained in a fall at the SNF. Type of Study: Bedside swallow evaluation Previous Swallow Assessment: none Diet Prior to this Study: Regular;Thin liquids Temperature Spikes Noted: No Respiratory Status: Nasal cannula History of Recent Intubation: No Behavior/Cognition: Alert;Cooperative;Pleasant mood Oral Cavity - Dentition: Dentures, top;Missing dentition (missing bottom dentition) Self-Feeding Abilities: Able to feed self Patient Positioning: Upright in bed Baseline Vocal Quality: Hoarse;Low vocal intensity Volitional Cough: Strong Volitional Swallow: Able to elicit    Oral/Motor/Sensory Function Overall Oral Motor/Sensory Function: Appears within functional limits for tasks assessed   Ice  Chips Ice chips: Not tested   Thin Liquid Thin Liquid:  Impaired Presentation: Straw;Self Fed Pharyngeal  Phase Impairments: Suspected delayed Swallow;Cough - Immediate    Nectar Thick Nectar Thick Liquid: Not tested   Honey Thick Honey Thick Liquid: Not tested   Puree Puree: Within functional limits Presentation: Spoon   Solid   GO    Solid: Impaired Oral Phase Impairments: Impaired anterior to posterior transit Oral Phase Functional Implications:  (prolonged oral transit due to SOB)      Ferdinand Lango MA, CCC-SLP [phone]  Kowen Kluth Meryl 09/23/2014,4:25 PM

## 2014-09-23 NOTE — Progress Notes (Signed)
Patient Demographics  [name] [name], is a 81 y.o. female, DOB - 04/23/1929, WUJ:[id number]  Admit date - 09/20/2014   Admitting Physician Dewayne ShorterShanker Levora DredgeM Ghimire, MD  Outpatient Primary MD for the patient is Julian HySOUTH,STEPHEN ALAN, MD  LOS - 3   Chief Complaint  Patient presents with  . Shortness of Breath        Subjective:   [name] [name] today has, No headache, No chest pain, No abdominal pain - No Nausea, No new weakness tingling or numbness, No Cough - SOB. ++ve Low back pain  Assessment & Plan     1. Acute respiratory failure secondary to acute on chronic diastolic CHF EF 65% on recent echogram - improving with IV Lasix now BID, salt and fluid restriction, supportive care. Added low-dose beta blocker. Monitor. Cardiology consulted, candidate for medical management. Have added pulmonary toiletry and chest PT as well.   2. Dyslipidemia. On statin continue.    3. Mild troponin elevation likely due to #1 above. Troponin rise is not in ACS pattern. On aspirin, statin, low-dose beta blocker will be added for secondary prevention. One-time cardiology input.    4. Low back pain after a fall that she sustained at nursing home. X-ray shows T12 superior endplate fracture with 30% height loss, poor candidate for surgical correction, supportive care, placed in TL SO brace when out of the bed. Have discussed with neurosurgeon Dr Franky Machoabbell over the phone 09-22-14. No lower extremity weakness or signs of cauda equina.    5. Hypothyroidism. On Synthroid continue.    6. GERD. On PPI.    Code Status: DNR  Family Communication: none present  Disposition Plan: SNF   Procedures   TTE 05-2014  - Left ventricle: The cavity size was normal. Wall thickness wasincreased in a pattern of  moderate LVH. Systolic function wasnormal. The estimated ejection fraction was in the range of 60%to 65%. Doppler parameters are consistent with abnormal leftventricular relaxation (grade 1 diastolic dysfunction). - Aortic valve: Mildly calcified annulus. Mildly thickenedleaflets. Valve area (VTI): 2.47 cm^2. Valve area (Vmax): 2.26cm^2. - Atrial septum: No defect or patent foramen ovale was identified.   Consults  N. Surg over the phone, Cards   Medications  Scheduled Meds: . carvedilol  3.125 mg Oral BID WC  . docusate sodium  200 mg Oral BID  . feeding supplement (ENSURE COMPLETE)  237 mL Oral BID BM  . furosemide  40 mg Intravenous BID  . heparin  5,000 Units Subcutaneous 3 times per day  . ipratropium-albuterol  3 mL Nebulization QID  . levothyroxine  125 mcg Oral QAC breakfast  . pantoprazole  40 mg Oral Daily  . polyethylene glycol  17 g Oral BID  . potassium chloride SA  20 mEq Oral Daily  . pravastatin  40 mg Oral QHS  . sodium chloride  3 mL Intravenous Q12H  . sodium chloride  3 mL Intravenous Q12H   Continuous Infusions:  PRN Meds:.sodium chloride, albuterol, aspirin, morphine injection, ondansetron **OR** ondansetron (ZOFRAN) IV, sodium chloride  DVT Prophylaxis    Heparin   Lab Results  Component Value Date   PLT 172 09/22/2014    Antibiotics    Anti-infectives    None  Objective:   Filed Vitals:   09/23/14 0034 09/23/14 0520 09/23/14 0913 09/23/14 0921  BP: 114/46 131/56 110/53   Pulse: 83 80 77   Temp: 97.6 F (36.4 C) 98.1 F (36.7 C)    TempSrc: Oral Oral    Resp: 20 20 18    Weight:  57.6 kg (126 lb 15.8 oz)    SpO2: 91% 97% 93% 91%    Wt Readings from Last 3 Encounters:  09/23/14 57.6 kg (126 lb 15.8 oz)  05/19/14 61 kg (134 lb 7.7 oz)  12/23/12 55.792 kg (123 lb)     Intake/Output Summary (Last 24 hours) at 09/23/14 1015 Last data filed at 09/23/14 0900  Gross per 24 hour  Intake    713 ml  Output   1400 ml  Net    -687 ml     Physical Exam  Awake Alert, Oriented X 3, No new F.N deficits, Normal affect [place].AT,PERRAL Supple Neck,No JVD, No cervical lymphadenopathy appriciated.  Symmetrical Chest wall movement, Good air movement bilaterally, Rales bilaterally RRR,No Gallops,Rubs or new Murmurs, No Parasternal Heave +ve B.Sounds, Abd Soft, No tenderness, No organomegaly appriciated, No rebound - guarding or rigidity. No Cyanosis, Clubbing or edema, No new Rash or bruise      Data Review   Micro Results Recent Results (from the past 240 hour(s))  Urine culture     Status: None   Collection Time: 09/20/14 12:27 PM  Result Value Ref Range Status   Specimen Description URINE, CATHETERIZED  Final   Special Requests NONE  Final   Colony Count NO GROWTH Performed at [facility]   Final   Culture NO GROWTH Performed at [facility]   Final   Report Status 09/21/2014 FINAL  Final  MRSA PCR Screening     Status: None   Collection Time: 09/20/14  6:42 PM  Result Value Ref Range Status   MRSA by PCR NEGATIVE NEGATIVE Final    Comment:        The GeneXpert MRSA Assay (FDA approved for NASAL specimens only), is one component of a comprehensive MRSA colonization surveillance program. It is not intended to diagnose MRSA infection nor to guide or monitor treatment for MRSA infections.     Radiology Reports Dg Thoracic Spine 2 View  09/22/2014   CLINICAL DATA:  81 year old female with thoracic spine pain  EXAM: THORACIC SPINE - 2 VIEW  COMPARISON:  Prior chest x-ray 09/20/2014 and 05/15/2014  FINDINGS: The bones are osteopenic. Compression fracture involving the superior endplate of T12. There is approximately 30% height loss anteriorly. Interval development of progressive exaggeration of the thoracic curvature. This finding was not present on the prior two-view chest x-ray from October of 2015. Is difficult to tell if the fracture was present on the more recent chest x-rays from  several days previously. The remaining vertebral body heights are maintained. Patchy airspace opacity in the left lower lobe favored to reflect atelectasis.  IMPRESSION: 1. Compared to May 15, 2014 interval development of an acute or subacute compression fracture involving the superior endplate of T12 with approximately 30% height loss. No evidence of posterior retropulsion. If confirmation of acuity is clinically warranted, consider further evaluation with MRI of the thoracic spine. 2. Incompletely imaged patchy left lower lobe opacity may reflect atelectasis or infiltrate. 3. Aortic atherosclerosis.   Electronically Signed   By: Malachy Moan M.D.   On: 09/22/2014 10:03   Dg Lumbar Spine Complete  09/22/2014   CLINICAL DATA:  Lower back pain.  EXAM: LUMBAR SPINE - COMPLETE 4+ VIEW  COMPARISON:  None.  FINDINGS: Diffuse osteopenia is noted. No fracture or significant spondylolisthesis is noted. Moderate degenerative disc disease is noted at L3-4 and L4-5. Mild compression deformity of T11 vertebral body is noted consistent with fracture of indeterminate age. No fracture or spondylolisthesis is noted in the lumbar spine.  IMPRESSION: Mild compression deformity of T11 vertebral body is noted consistent with fracture of indeterminate age. Moderate multilevel degenerative disc disease is noted in the lower lumbar spine. No acute abnormality seen in the lumbar spine.   Electronically Signed   By: Lupita Raider, M.D.   On: 09/22/2014 10:33   Dg Chest Port 1 View  09/20/2014   CLINICAL DATA:  Sudden onset of shortness of breath.  EXAM: PORTABLE CHEST - 1 VIEW  COMPARISON:  09/20/2014 at 10:29 a.m.  FINDINGS: Interstitial accentuation is again noted along with airspace opacity at the right lung base. Improved conspicuity of the right hemidiaphragm. Mild airspace opacity at the left lung base.  Atherosclerotic calcification of the aortic arch. Upper normal heart size.  IMPRESSION: 1. Similar appearance to  earlier today, bilateral interstitial opacity favoring interstitial edema, and bibasilar airspace opacities which could be from confluent edema or pneumonia.   Electronically Signed   By: Herbie Baltimore M.D.   On: 09/20/2014 18:37   Dg Chest Portable 1 View  09/20/2014   CLINICAL DATA:  Shortness of breath.  EXAM: PORTABLE CHEST - 1 VIEW  COMPARISON:  May 15, 2014.  FINDINGS: Stable cardiomediastinal silhouette. Patient is rotated to the left. No pneumothorax or significant pleural effusion is noted. Mild bilateral perihilar and basilar interstitial densities are noted consistent with pulmonary edema. Bony thorax is intact.  IMPRESSION: Findings consistent with mild bilateral pulmonary edema.   Electronically Signed   By: Roque Lias M.D.   On: 09/20/2014 11:25     CBC  Recent Labs Lab 09/20/14 1010 09/21/14 0805 09/22/14 0305  WBC 7.9 5.9 5.8  HGB 12.8 12.2 11.8*  HCT 41.2 38.8 37.1  PLT 145* 159 172  MCV 94.9 93.5 91.6  MCH 29.5 29.4 29.1  MCHC 31.1 31.4 31.8  RDW 14.6 14.4 14.3  LYMPHSABS 0.7  --   --   MONOABS 0.7  --   --   EOSABS 0.3  --   --   BASOSABS 0.0  --   --     Chemistries   Recent Labs Lab 09/20/14 1010 09/21/14 0805 09/22/14 0305  NA 141 141 140  K 4.0 3.9 3.9  CL 105 102 99  CO2 29 31 32  GLUCOSE 142* 105* 108*  BUN 30* 29* 30*  CREATININE 0.86 0.80 0.77  CALCIUM 8.8 8.7 8.7  AST 17 14  --   ALT 12 11  --   ALKPHOS 77 72  --   BILITOT 0.5 0.8  --    ------------------------------------------------------------------------------------------------------------------ estimated creatinine clearance is 46 mL/min (by C-G formula based on Cr of 0.77). ------------------------------------------------------------------------------------------------------------------ No results for input(s): HGBA1C in the last 72 hours. ------------------------------------------------------------------------------------------------------------------ No results for  input(s): CHOL, HDL, LDLCALC, TRIG, CHOLHDL, LDLDIRECT in the last 72 hours. ------------------------------------------------------------------------------------------------------------------ No results for input(s): TSH, T4TOTAL, T3FREE, THYROIDAB in the last 72 hours.  Invalid input(s): FREET3 ------------------------------------------------------------------------------------------------------------------ No results for input(s): VITAMINB12, FOLATE, FERRITIN, TIBC, IRON, RETICCTPCT in the last 72 hours.  Coagulation profile No results for input(s): INR, PROTIME in the last 168 hours.  No results for input(s): DDIMER in  the last 72 hours.  Cardiac Enzymes  Recent Labs Lab 09/21/14 1600 09/21/14 2101 09/22/14 0305  TROPONINI 0.37* 0.33* 0.38*   ------------------------------------------------------------------------------------------------------------------ Invalid input(s): POCBNP     Time Spent in minutes  35   SINGH,PRASHANT K M.D on 09/23/2014 at 10:15 AM  Between 7am to 7pm - Pager - [phone]  After 7pm go to [email or web address] - password [facility]  [facility] Office  [phone]

## 2014-09-23 NOTE — Progress Notes (Addendum)
Per discussion with Dr. Thedore MinsSingh during Progression Meeting today- patient may be medically stable for d/c tomorrow back to [name].  CS[facility] but needs to meet with patient's grandson to complete readmission paperwork prior to patient's return. CSW has left messages for Leta Junglinggranson- Donald Owens- [phone] or [phone] respectively.  Awaiting return call from grandson.  Fl2 on chart for MD's signature. Will facilitate d/c when medically stable per MD. Lupita Leashonna T. Andria RheinCrowder, LCSW [phone]   Call received from patient's grandson and updated on patient's d/c plan. He was pleased to know that she is nearing stability for d/c.  [facility].  Lorri Frederickonna T. Jaci LazierCrowder, Kentucky[place]CS[place] [phone]  .

## 2014-09-23 NOTE — Progress Notes (Signed)
  Echocardiogram 2D Echocardiogram has been performed.  [name] 09/23/2014, 3:18 PM

## 2014-09-23 NOTE — Progress Notes (Signed)
Pt refused Bipap at this time, RT informed pt if she changed her mind to call for RT

## 2014-09-23 NOTE — Progress Notes (Signed)
Patient not putting out as much urine today.Approximately 200 cc's. She is also not drinking a lot of fluids today due to low output. Bladder scanned patient. 0 ML noted. Will inform MD.

## 2014-09-23 NOTE — Evaluation (Signed)
Physical Therapy Evaluation Patient Details Name: Aida Lemaire MRN: [id number] DOB: [birth year] Today's Date: 09/23/2014   History of Present Illness  Murl Denecia Brunette is a 81 y.o. female, resident of skilled nursing facility with known history of O2 dependent COPD and diastolic heart failure as well as hypothyroidism and dyslipidemia. Was sent to the ER from the skilled nursing facility for reported shortness of breath. Her room air saturations were 70-80% when assessed in the ER. She required BiPAP at presentation. Her chest x-ray was consistent with edema. She was given Lasix IV in the ER.  Xray revealed T12 fx sustained in a fall at the SNF.  Clinical Impression  Pt admitted with above diagnosis. Pt currently with functional limitations due to the deficits listed below (see PT Problem List).  Pt will benefit from skilled PT to increase their independence and safety with mobility to allow discharge to the venue listed below.       Follow Up Recommendations SNF;Supervision/Assistance - 24 hour    Equipment Recommendations  None recommended by PT    Recommendations for Other Services       Precautions / Restrictions Precautions Precautions: Fall;Back Precaution Comments: Pt educated on 3/3 back precautions for comfort. Required Braces or Orthoses: Spinal Brace Spinal Brace: Thoracolumbosacral orthotic;Applied in sitting position      Mobility  Bed Mobility Overal bed mobility: Needs Assistance Bed Mobility: Sidelying to Sit;Rolling Rolling: Mod assist Sidelying to sit: Mod assist;HOB elevated       General bed mobility comments: verbal/tactile cues for logroll; pt use of bed rail  Transfers Overall transfer level: Needs assistance Equipment used: Rolling walker (2 wheeled) Transfers: Sit to/from Stand Sit to Stand: Mod assist         General transfer comment: retropulsive with initial stance, dependent for donning  brace  Ambulation/Gait Ambulation/Gait assistance: Min assist Ambulation Distance (Feet): 3 Feet Assistive device: Rolling walker (2 wheeled) Gait Pattern/deviations: Decreased stride length;Step-through pattern;Antalgic Gait velocity: decreased Gait velocity interpretation: <1.8 ft/sec, indicative of risk for recurrent falls    Stairs            Wheelchair Mobility    Modified Rankin (Stroke Patients Only)       Balance Overall balance assessment: Needs assistance Sitting-balance support: Bilateral upper extremity supported;Feet supported Sitting balance-Leahy Scale: Good     Standing balance support: Bilateral upper extremity supported;During functional activity Standing balance-Leahy Scale: Poor                               Pertinent Vitals/Pain Pain Assessment: Faces Faces Pain Scale: Hurts even more Pain Location: back with mobility Pain Intervention(s): Monitored during session;Repositioned    Home Living Family/patient expects to be discharged to:: Skilled nursing facility                 Additional Comments: Pt from [name]     Prior Function Level of Independence: Independent with assistive device(s)         Comments: Pt states she uses a RW and has assist for ADLs      Hand Dominance   Dominant Hand: Right    Extremity/Trunk Assessment   Upper Extremity Assessment: Generalized weakness           Lower Extremity Assessment: Generalized weakness      Cervical / Trunk Assessment: Kyphotic  Communication   Communication: HOH  Cognition Arousal/Alertness: Awake/alert Behavior During Therapy: WFL for tasks assessed/performed Overall  Cognitive Status: No family/caregiver present to determine baseline cognitive functioning       Memory: Decreased short-term memory              General Comments      Exercises        Assessment/Plan    PT Assessment Patient needs continued PT services  PT  Diagnosis Difficulty walking;Generalized weakness;Acute pain   PT Problem List Decreased strength;Decreased activity tolerance;Decreased balance;Decreased mobility;Pain;Cardiopulmonary status limiting activity;Decreased knowledge of precautions;Decreased safety awareness  PT Treatment Interventions DME instruction;Gait training;Functional mobility training;Therapeutic activities;Therapeutic exercise;Patient/family education;Balance training   PT Goals (Current goals can be found in the Care Plan section) Acute Rehab PT Goals Patient Stated Goal: return to [name] PT Goal Formulation: With patient Time For Goal Achievement: 10/07/14 Potential to Achieve Goals: Good    Frequency Min 3X/week   Barriers to discharge        Co-evaluation               End of Session Equipment Utilized During Treatment: Gait belt;Back brace;Oxygen Activity Tolerance: Patient tolerated treatment well Patient left: in chair;with call bell/phone within reach;with nursing/sitter in room Nurse Communication: Mobility status         Time: [id number] PT Time Calculation (min) (ACUTE ONLY): 31 min   Charges:   PT Evaluation $Initial PT Evaluation Tier I: 1 Procedure PT Treatments $Therapeutic Activity: 8-22 mins   PT G Codes:        Ilda FoilGarrow, Alysiana Ethridge Rene 09/23/2014, 11:29 AM

## 2014-09-24 DIAGNOSIS — N182 Chronic kidney disease, stage 2 (mild): Secondary | ICD-10-CM

## 2014-09-24 LAB — BASIC METABOLIC PANEL
Anion gap: 11 (ref 5–15)
BUN: 28 mg/dL — ABNORMAL HIGH (ref 6–23)
CO2: 29 mmol/L (ref 19–32)
Calcium: 9 mg/dL (ref 8.4–10.5)
Chloride: 96 mmol/L (ref 96–112)
Creatinine, Ser: 0.88 mg/dL (ref 0.50–1.10)
GFR calc Af Amer: 69 mL/min — ABNORMAL LOW (ref >90)
GFR calc non Af Amer: 59 mL/min — ABNORMAL LOW (ref >90)
Glucose, Bld: 114 mg/dL — ABNORMAL HIGH (ref 70–99)
Potassium: 4.3 mmol/L (ref 3.5–5.1)
Sodium: 136 mmol/L (ref 135–145)

## 2014-09-24 MED ORDER — HYDROCODONE-ACETAMINOPHEN 5-325 MG PO TABS
1.0000 | ORAL_TABLET | Freq: Three times a day (TID) | ORAL | Status: DC | PRN
  Administered 2014-09-24 – 2014-09-25 (×2): 1 via ORAL
  Filled 2014-09-24 (×2): qty 1

## 2014-09-24 MED ORDER — ENSURE COMPLETE PO LIQD: 237.0000 mL | Freq: Two times a day (BID) | ORAL | Status: DC

## 2014-09-24 MED ORDER — FUROSEMIDE 40 MG PO TABS: 40.0000 mg | ORAL_TABLET | Freq: Every day | ORAL | Status: AC

## 2014-09-24 MED ORDER — IPRATROPIUM-ALBUTEROL 0.5-2.5 (3) MG/3ML IN SOLN
3.0000 mL | Freq: Two times a day (BID) | RESPIRATORY_TRACT | Status: DC
  Administered 2014-09-25: 3 mL via RESPIRATORY_TRACT
  Filled 2014-09-24: qty 3

## 2014-09-24 MED ORDER — GUAIFENESIN 100 MG/5ML PO SYRP
200.0000 mg | ORAL_SOLUTION | ORAL | Status: DC | PRN
  Filled 2014-09-24: qty 10

## 2014-09-24 MED ORDER — CARVEDILOL 3.125 MG PO TABS: 3.1250 mg | ORAL_TABLET | Freq: Two times a day (BID) | ORAL | Status: AC

## 2014-09-24 MED ORDER — FUROSEMIDE 40 MG PO TABS
40.0000 mg | ORAL_TABLET | Freq: Every day | ORAL | Status: DC
  Administered 2014-09-25: 40 mg via ORAL
  Filled 2014-09-24: qty 1

## 2014-09-24 MED ORDER — DOCUSATE SODIUM 50 MG/5ML PO LIQD
200.0000 mg | Freq: Two times a day (BID) | ORAL | Status: DC
  Administered 2014-09-24 – 2014-09-25 (×3): 200 mg via ORAL
  Filled 2014-09-24 (×4): qty 20

## 2014-09-24 MED ORDER — SALINE SPRAY 0.65 % NA SOLN
1.0000 | NASAL | Status: DC | PRN
  Filled 2014-09-24: qty 44

## 2014-09-24 MED ORDER — ASPIRIN 81 MG PO CHEW
81.0000 mg | CHEWABLE_TABLET | Freq: Every day | ORAL | Status: DC
  Administered 2014-09-24 – 2014-09-25 (×2): 81 mg via ORAL
  Filled 2014-09-24 (×2): qty 1

## 2014-09-24 NOTE — Progress Notes (Signed)
Physical Therapy Treatment Patient Details Name: [name] AreaMurl [name] MRN: [id number] DOB: 08/26/1928 Today's Date: 09/24/2014    History of Present Illness [name] [name] is a 81 y.o. female, resident of skilled nursing facility with known history of O2 dependent COPD and diastolic heart failure as well as hypothyroidism and dyslipidemia. Was sent to the ER from the skilled nursing facility for reported shortness of breath. Her room air saturations were 70-80% when assessed in the ER. She required BiPAP at presentation. Her chest x-ray was consistent with edema. She was given Lasix IV in the ER.  Xray revealed T12 fx sustained in a fall at the SNF.    PT Comments    Limited treatment as patient fatigued from already up with nursing staff to toilet.  She participated to practice safe bed mobility and transfers with brace with back precautions.  Easily fatigued and limited by pain.  Agree with SNF level rehab at d/c.  Follow Up Recommendations  SNF;Supervision/Assistance - 24 hour     Equipment Recommendations  None recommended by PT    Recommendations for Other Services       Precautions / Restrictions Precautions Precautions: Fall;Back Required Braces or Orthoses: Spinal Brace Spinal Brace: Thoracolumbosacral orthotic;Applied in sitting position    Mobility  Bed Mobility Overal bed mobility: Needs Assistance Bed Mobility: Sit to Sidelying         Sit to sidelying: Mod assist General bed mobility comments: assist for sidelying technique for back precautions  Transfers Overall transfer level: Needs assistance Equipment used: Rolling walker (2 wheeled)   Sit to Stand: Mod assist;+2 physical assistance         General transfer comment: assist to stand from bed and side step to head of bed  Ambulation/Gait Ambulation/Gait assistance: Min assist Ambulation Distance (Feet): 1 Feet Assistive device: Rolling walker (2 wheeled) Gait Pattern/deviations: Step-to  pattern     General Gait Details: side steps to head of bed   Stairs            Wheelchair Mobility    Modified Rankin (Stroke Patients Only)       Balance             Standing balance-Leahy Scale: Poor Standing balance comment: need UE support for balance                    Cognition Arousal/Alertness: Awake/alert Behavior During Therapy: WFL for tasks assessed/performed Overall Cognitive Status: Impaired/Different from baseline                      Exercises      General Comments        Pertinent Vitals/Pain Faces Pain Scale: Hurts even more Pain Location: back with mobility Pain Intervention(s): Repositioned;Monitored during session    Home Living                      Prior Function            PT Goals (current goals can now be found in the care plan section) Progress towards PT goals: Progressing toward goals    Frequency  Min 3X/week    PT Plan Current plan remains appropriate    Co-evaluation             End of Session Equipment Utilized During Treatment: Gait belt;Back brace;Oxygen Activity Tolerance: Patient limited by fatigue Patient left: in bed     Time: [id number] PT Time Calculation (min) (  ACUTE ONLY): 17 min  Charges:  $Therapeutic Activity: 8-22 mins                    G Codes:      Khoa Opdahl,CYNDI 2014/10/05, 7:08 PM Sheran Lawless, PT [phone] Oct 05, 2014

## 2014-09-24 NOTE — Progress Notes (Addendum)
Per Dr. Thedore MinsSingh- patient is not medically stable for d/c today. Hopefully ready tomorrow.  [facility] over [facility] weekend if d/c is indicated. Alain HoneyGrandson Donald has completed readmission paperwork.  Fl2 signed by MD. Message left or grandson re: above and discussed with patient who is pleased with d/c plan.  Will ask weekend CSW coverage to monitor or possible stability.  Lorri Frederickonna T. Andria RheinCrowder, LCSW [phone]   Spoke to grandson Roe CoombsDon this evening. He will await call re: d/c when patient is medically stable. He is pleased with updated information.  Lorri Frederickonna T. Jaci LazierCrowder, Kentucky[place]CSW [phone]

## 2014-09-24 NOTE — Progress Notes (Signed)
Patient Demographics  Murl Rayelynn Loyal, is a 81 y.o. female, DOB - 1929-03-04, ZOX:[id number]  Admit date - 09/20/2014   Admitting Physician Dewayne Shorter Levora Dredge, MD  Outpatient Primary MD for the patient is Julian Hy, MD  LOS - 4   Chief Complaint  Patient presents with  . Shortness of Breath        Subjective:   [name] today has, No headache, No chest pain, No abdominal pain - No Nausea, No new weakness tingling or numbness, No Cough - SOB. ++ve Low back pain  Assessment & Plan     1. Acute respiratory failure secondary to acute on chronic diastolic CHF EF 65% on recent echogram -improved and today switched to oral Lasix continue salt and fluid restriction, supportive care. Added low-dose beta blocker. Monitor. Cardiology consulted, candidate for medical management only. Continue pulmonary toiletry and chest PT as well.  Filed Weights   09/22/14 0530 09/23/14 0520 09/24/14 0529  Weight: 57.586 kg (126 lb 15.3 oz) 57.6 kg (126 lb 15.8 oz) 56.4 kg (124 lb 5.4 oz)     2. Dyslipidemia. On statin continue.    3. Mild troponin elevation likely due to #1 above. Troponin rise is not in ACS pattern. On aspirin, statin, low-dose beta blocker will be added for secondary prevention. Cardiology following.    4. Low back pain after a fall that she sustained at nursing home. X-ray shows T12 superior endplate fracture with 30% height loss, poor candidate for surgical correction, supportive care, placed in TL SO brace when out of the bed for 3 months then follow with neurosurgery in the office. Have discussed with neurosurgeon Dr Franky Macho over the phone 09-22-14. No lower extremity weakness or signs of cauda equina.    5. Hypothyroidism. On Synthroid continue.    6.  GERD. On PPI.    Code Status: DNR  Family Communication: none present  Disposition Plan: SNF   Procedures   TTE 05-2014  - Left ventricle: The cavity size was normal. Wall thickness wasincreased in a pattern of moderate LVH. Systolic function wasnormal. The estimated ejection fraction was in the range of 60%to 65%. Doppler parameters are consistent with abnormal leftventricular relaxation (grade 1 diastolic dysfunction). - Aortic valve: Mildly calcified annulus. Mildly thickenedleaflets. Valve area (VTI): 2.47 cm^2. Valve area (Vmax): 2.26cm^2. - Atrial septum: No defect or patent foramen ovale was identified.   Consults  N. Surg over the phone, Cards   Medications  Scheduled Meds: . aspirin  81 mg Oral Daily  . carvedilol  3.125 mg Oral BID WC  . docusate sodium  200 mg Oral BID  . feeding supplement (ENSURE COMPLETE)  237 mL Oral BID BM  . [START ON 09/25/2014] furosemide  40 mg Oral Daily  . heparin  5,000 Units Subcutaneous 3 times per day  . ipratropium-albuterol  3 mL Nebulization QID  . levothyroxine  125 mcg Oral QAC breakfast  . pantoprazole  40 mg Oral Daily  . polyethylene glycol  17 g Oral BID  . pravastatin  40 mg Oral QHS  . sodium chloride  3 mL Intravenous Q12H   Continuous Infusions:  PRN Meds:.albuterol, morphine injection, ondansetron **OR** ondansetron (ZOFRAN) IV  DVT Prophylaxis    Heparin  Lab Results  Component Value Date   PLT 172 09/22/2014    Antibiotics    Anti-infectives    None          Objective:   Filed Vitals:   09/23/14 2037 09/24/14 0529 09/24/14 0845 09/24/14 0910  BP: 100/81 118/52 108/54   Pulse: 81 78 112   Temp: 99.4 F (37.4 C) 98.2 F (36.8 C) 98.4 F (36.9 C)   TempSrc: Oral Oral Axillary   Resp: 20 20 18    Weight:  56.4 kg (124 lb 5.4 oz)    SpO2: 93% 100% 100% 90%    Wt Readings from Last 3 Encounters:  09/24/14 56.4 kg (124 lb 5.4 oz)  05/19/14 61 kg (134 lb 7.7 oz)  12/23/12 55.792 kg  (123 lb)     Intake/Output Summary (Last 24 hours) at 09/24/14 0957 Last data filed at 09/24/14 0953  Gross per 24 hour  Intake    920 ml  Output   1250 ml  Net   -330 ml     Physical Exam  Awake Alert, Oriented X 3, No new F.N deficits, Normal affect .AT,PERRAL Supple Neck,No JVD, No cervical lymphadenopathy appriciated.  Symmetrical Chest wall movement, Good air movement bilaterally, Rales bilaterally RRR,No Gallops,Rubs or new Murmurs, No Parasternal Heave +ve B.Sounds, Abd Soft, No tenderness, No organomegaly appriciated, No rebound - guarding or rigidity. No Cyanosis, Clubbing or edema, No new Rash or bruise      Data Review   Micro Results Recent Results (from the past 240 hour(s))  Urine culture     Status: None   Collection Time: 09/20/14 12:27 PM  Result Value Ref Range Status   Specimen Description URINE, CATHETERIZED  Final   Special Requests NONE  Final   Colony Count NO GROWTH Performed at [facility]   Final   Culture NO GROWTH Performed at [facility]   Final   Report Status 09/21/2014 FINAL  Final  MRSA PCR Screening     Status: None   Collection Time: 09/20/14  6:42 PM  Result Value Ref Range Status   MRSA by PCR NEGATIVE NEGATIVE Final    Comment:        The GeneXpert MRSA Assay (FDA approved for NASAL specimens only), is one component of a comprehensive MRSA colonization surveillance program. It is not intended to diagnose MRSA infection nor to guide or monitor treatment for MRSA infections.     Radiology Reports Dg Thoracic Spine 2 View  09/22/2014   CLINICAL DATA:  81 year old female with thoracic spine pain  EXAM: THORACIC SPINE - 2 VIEW  COMPARISON:  Prior chest x-ray 09/20/2014 and 05/15/2014  FINDINGS: The bones are osteopenic. Compression fracture involving the superior endplate of T12. There is approximately 30% height loss anteriorly. Interval development of progressive exaggeration of the thoracic curvature.  This finding was not present on the prior two-view chest x-ray from October of 2015. Is difficult to tell if the fracture was present on the more recent chest x-rays from several days previously. The remaining vertebral body heights are maintained. Patchy airspace opacity in the left lower lobe favored to reflect atelectasis.  IMPRESSION: 1. Compared to May 15, 2014 interval development of an acute or subacute compression fracture involving the superior endplate of T12 with approximately 30% height loss. No evidence of posterior retropulsion. If confirmation of acuity is clinically warranted, consider further evaluation with MRI of the thoracic spine. 2. Incompletely imaged patchy left lower lobe opacity may reflect  atelectasis or infiltrate. 3. Aortic atherosclerosis.   Electronically Signed   By: Malachy Moan M.D.   On: 09/22/2014 10:03   Dg Lumbar Spine Complete  09/22/2014   CLINICAL DATA:  Lower back pain.  EXAM: LUMBAR SPINE - COMPLETE 4+ VIEW  COMPARISON:  None.  FINDINGS: Diffuse osteopenia is noted. No fracture or significant spondylolisthesis is noted. Moderate degenerative disc disease is noted at L3-4 and L4-5. Mild compression deformity of T11 vertebral body is noted consistent with fracture of indeterminate age. No fracture or spondylolisthesis is noted in the lumbar spine.  IMPRESSION: Mild compression deformity of T11 vertebral body is noted consistent with fracture of indeterminate age. Moderate multilevel degenerative disc disease is noted in the lower lumbar spine. No acute abnormality seen in the lumbar spine.   Electronically Signed   By: Lupita Raider, M.D.   On: 09/22/2014 10:33   Dg Chest Port 1 View  09/20/2014   CLINICAL DATA:  Sudden onset of shortness of breath.  EXAM: PORTABLE CHEST - 1 VIEW  COMPARISON:  09/20/2014 at 10:29 a.m.  FINDINGS: Interstitial accentuation is again noted along with airspace opacity at the right lung base. Improved conspicuity of the right  hemidiaphragm. Mild airspace opacity at the left lung base.  Atherosclerotic calcification of the aortic arch. Upper normal heart size.  IMPRESSION: 1. Similar appearance to earlier today, bilateral interstitial opacity favoring interstitial edema, and bibasilar airspace opacities which could be from confluent edema or pneumonia.   Electronically Signed   By: Herbie Baltimore M.D.   On: 09/20/2014 18:37   Dg Chest Portable 1 View  09/20/2014   CLINICAL DATA:  Shortness of breath.  EXAM: PORTABLE CHEST - 1 VIEW  COMPARISON:  May 15, 2014.  FINDINGS: Stable cardiomediastinal silhouette. Patient is rotated to the left. No pneumothorax or significant pleural effusion is noted. Mild bilateral perihilar and basilar interstitial densities are noted consistent with pulmonary edema. Bony thorax is intact.  IMPRESSION: Findings consistent with mild bilateral pulmonary edema.   Electronically Signed   By: Roque Lias M.D.   On: 09/20/2014 11:25     CBC  Recent Labs Lab 09/20/14 1010 09/21/14 0805 09/22/14 0305  WBC 7.9 5.9 5.8  HGB 12.8 12.2 11.8*  HCT 41.2 38.8 37.1  PLT 145* 159 172  MCV 94.9 93.5 91.6  MCH 29.5 29.4 29.1  MCHC 31.1 31.4 31.8  RDW 14.6 14.4 14.3  LYMPHSABS 0.7  --   --   MONOABS 0.7  --   --   EOSABS 0.3  --   --   BASOSABS 0.0  --   --     Chemistries   Recent Labs Lab 09/20/14 1010 09/21/14 0805 09/22/14 0305 09/24/14 0454  NA 141 141 140 136  K 4.0 3.9 3.9 4.3  CL 105 102 99 96  CO2 29 31 32 29  GLUCOSE 142* 105* 108* 114*  BUN 30* 29* 30* 28*  CREATININE 0.86 0.80 0.77 0.88  CALCIUM 8.8 8.7 8.7 9.0  AST 17 14  --   --   ALT 12 11  --   --   ALKPHOS 77 72  --   --   BILITOT 0.5 0.8  --   --    ------------------------------------------------------------------------------------------------------------------ estimated creatinine clearance is 41.8 mL/min (by C-G formula based on Cr of  0.88). ------------------------------------------------------------------------------------------------------------------ No results for input(s): HGBA1C in the last 72 hours. ------------------------------------------------------------------------------------------------------------------ No results for input(s): CHOL, HDL, LDLCALC, TRIG, CHOLHDL, LDLDIRECT in the last  72 hours. ------------------------------------------------------------------------------------------------------------------ No results for input(s): TSH, T4TOTAL, T3FREE, THYROIDAB in the last 72 hours.  Invalid input(s): FREET3 ------------------------------------------------------------------------------------------------------------------ No results for input(s): VITAMINB12, FOLATE, FERRITIN, TIBC, IRON, RETICCTPCT in the last 72 hours.  Coagulation profile No results for input(s): INR, PROTIME in the last 168 hours.  No results for input(s): DDIMER in the last 72 hours.  Cardiac Enzymes  Recent Labs Lab 09/21/14 1600 09/21/14 2101 09/22/14 0305  TROPONINI 0.37* 0.33* 0.38*   ------------------------------------------------------------------------------------------------------------------ Invalid input(s): POCBNP     Time Spent in minutes  35   Arad Burston K M.D on 09/24/2014 at 9:57 AM  Between 7am to 7pm - Pager - [phone]  After 7pm go to [email or web address] - password [facility]  [facility] Office  [phone]

## 2014-09-24 NOTE — Progress Notes (Signed)
Speech Language Pathology Treatment: Dysphagia  Patient Details Name: [name] [name] MRN: [id number] DOB: 08/04/1929 Today's Date: 09/24/2014 Time: [id number] SLP Time Calculation (min) (ACUTE ONLY): 17 min  Assessment / Plan / Recommendation Clinical Impression  Skilled treatment session focused on addressing dysphagia goals.  Upon SLP arrival patient with low O2 readings 83-84 and required Max multimodal cues for pursed lip breathing throughout session.  Patient also demonstrate intermittent congested, cough at baseline and intermittently throughout trials, which SLP does not suspect is related to PO intake at this time given delay and presence at baseline.  Patient consumed Dys. 2 textures and thin liquids via cup with efficient mastication and oral clearance as well as reduced number of swallows per sip with Min verbal cues for small controlled cup sips.  Recommend to continue with current plan of care.       HPI HPI: 81 y.o. female, resident of skilled nursing facility with known history of O2 dependent COPD, GERD, and diastolic heart failure as well as hypothyroidism and dyslipidemia. Was sent to the ER from the skilled nursing facility for reported shortness of breath. Her room air saturations were 70-80% when assessed in the ER. She required BiPAP at presentation. Her chest x-ray was consistent with edema vs PNA. She was given Lasix IV in the ER. Xray revealed T12 fx sustained in a fall at the SNF.   Pertinent Vitals Pain Assessment: No/denies pain  SLP Plan  Continue with current plan of care    Recommendations Diet recommendations: Dysphagia 2 (fine chop);Thin liquid Liquids provided via: Cup;No straw Medication Administration: Crushed with puree Supervision: Patient able to self feed;Full supervision/cueing for compensatory strategies Compensations: Slow rate;Small sips/bites (rest breaks for O2) Postural Changes and/or Swallow Maneuvers: Seated upright 90 degrees;Upright  30-60 min after meal              Oral Care Recommendations: Oral care BID Follow up Recommendations: Skilled Nursing facility;24 hour supervision/assistance Plan: Continue with current plan of care    GO     Charlane FerrettiMelissa Yonas Bunda, M.A., CCC-SLP [phone]  [name] [name] 09/24/2014, 11:13 AM

## 2014-09-24 NOTE — Progress Notes (Signed)
Patient Name: [name] Virginia Siefker Date of Encounter: 09/24/2014   Principal Problem:   Acute on chronic respiratory failure with hypoxia Active Problems:   Dyslipidemia   Essential hypertension, benign   Hypothyroidism   COPD exacerbation   Chronic diastolic heart failure   GERD (gastroesophageal reflux disease)   Acute on chronic diastolic CHF (congestive heart failure)   CKD (chronic kidney disease), stage II   Troponin level elevated-felt to be secondary to acute resp failure   Compression fracture of body of thoracic vertebra-wears a brace   RBBB   SUBJECTIVE  Says breathing is "on and off."  Not quite at baseline.  No chest pain.  CURRENT MEDS . carvedilol  3.125 mg Oral BID WC  . docusate sodium  200 mg Oral BID  . feeding supplement (ENSURE COMPLETE)  237 mL Oral BID BM  . furosemide  40 mg Intravenous BID  . heparin  5,000 Units Subcutaneous 3 times per day  . ipratropium-albuterol  3 mL Nebulization QID  . levothyroxine  125 mcg Oral QAC breakfast  . pantoprazole  40 mg Oral Daily  . polyethylene glycol  17 g Oral BID  . pravastatin  40 mg Oral QHS  . sodium chloride  3 mL Intravenous Q12H  . sodium chloride  3 mL Intravenous Q12H    OBJECTIVE  Filed Vitals:   09/23/14 1605 09/23/14 2018 09/23/14 2037 09/24/14 0529  BP:   100/81 118/52  Pulse:   81 78  Temp:   99.4 F (37.4 C) 98.2 F (36.8 C)  TempSrc:   Oral Oral  Resp:   20 20  Weight:    124 lb 5.4 oz (56.4 kg)  SpO2: 96% 93% 93% 100%    Intake/Output Summary (Last 24 hours) at 09/24/14 0828 Last data filed at 09/24/14 0649  Gross per 24 hour  Intake    620 ml  Output   1300 ml  Net   -680 ml   Filed Weights   09/22/14 0530 09/23/14 0520 09/24/14 0529  Weight: 126 lb 15.3 oz (57.586 kg) 126 lb 15.8 oz (57.6 kg) 124 lb 5.4 oz (56.4 kg)    PHYSICAL EXAM  General: Pleasant, NAD. Neuro: Alert and oriented X 3. Moves all extremities spontaneously. Psych: Normal affect. HEENT:   Normal  Neck: Supple without bruits.  Neck veins difficult to gauge 2/2 positioning. Lungs:  Resp regular and unlabored, diminished breath sounds bilat with bibasilar crackles. Heart: RRR, distant, no obvious s3, s4, or murmurs. Abdomen: Soft, ruq tenderness, non-distended, BS + x 4.  Extremities: No clubbing, cyanosis or edema. DP/PT/Radials 2+ and equal bilaterally.  Accessory Clinical Findings  CBC  Recent Labs  09/22/14 0305  WBC 5.8  HGB 11.8*  HCT 37.1  MCV 91.6  PLT 172   Basic Metabolic Panel  Recent Labs  09/22/14 0305 09/24/14 0454  NA 140 136  K 3.9 4.3  CL 99 96  CO2 32 29  GLUCOSE 108* 114*  BUN 30* 28*  CREATININE 0.77 0.88  CALCIUM 8.7 9.0   Cardiac Enzymes  Recent Labs  09/21/14 1600 09/21/14 2101 09/22/14 0305  TROPONINI 0.37* 0.33* 0.38*   TELE  rsr  Radiology/Studies  Dg Thoracic Spine 2 View  09/22/2014   CLINICAL DATA:  81 year old female with thoracic spine pain  EXAM: THORACIC SPINE - 2 VIEW    IMPRESSION: 1. Compared to May 15, 2014 interval development of an acute or subacute compression fracture involving the superior endplate of T12  with approximately 30% height loss. No evidence of posterior retropulsion. If confirmation of acuity is clinically warranted, consider further evaluation with MRI of the thoracic spine. 2. Incompletely imaged patchy left lower lobe opacity may reflect atelectasis or infiltrate. 3. Aortic atherosclerosis.   Electronically Signed   By: Malachy Moan M.D.   On: 09/22/2014 10:03   Dg Lumbar Spine Complete  09/22/2014   CLINICAL DATA:  Lower back pain.  EXAM: LUMBAR SPINE - COMPLETE 4+ VIEW   IMPRESSION: Mild compression deformity of T11 vertebral body is noted consistent with fracture of indeterminate age. Moderate multilevel degenerative disc disease is noted in the lower lumbar spine. No acute abnormality seen in the lumbar spine.   Electronically Signed   By: Lupita Raider, M.D.   On: 09/22/2014  10:33   2D Echocardiogram 2.11.2016  Study Conclusions  - Left ventricle: The cavity size was normal. Wall thickness was   increased in a pattern of mild LVH. Systolic function was normal.   The estimated ejection fraction was in the range of 55% to 60%.   Doppler parameters are consistent with abnormal left ventricular   relaxation (grade 1 diastolic dysfunction). - Mitral valve: Calcified annulus. - Left atrium: The atrium was mildly dilated. - Atrial septum: There was increased thickness of the septum,   consistent with lipomatous hypertrophy. _____________   ASSESSMENT AND PLAN  1.  Acute on chronic diastolic chf:  Minus 3.5 L since admission with reduction in weight from 129 lbs to 124 lbs this AM.  Previous dry wt appears to be 120 in 10/2012.  Renal fxn, HR/BP stable.  Still w/ crackles on exam, though this could represent atx.  She did receive a dose of IV lasix this AM.  Suspect that we can switch her over to 40 mg po daily to start tomorrow (was not on lasix @ home previously).  Cont bb.  She was on lisinopril 2.5 and norvasc 10 @ home also but BP currently too soft to resume.  2.  Elevated troponin:  Flat trend: 0.44*  0.37* 0.33* 0.38*.  Suspect 2/2 volume overload/chf.  Echo yesterday showed nl LV fxn w/o regional wma's.  As a result, no plan for further ischemic evaluation. Cont bb/statin.  Add ASA 81.  3.  CKD II:  Creat stable.  4.  COPD:  Inhaler per IM.  She wears O2 chronically.  Signed, Nicolasa Ducking NP  I have seen and examined the patient along with Nicolasa Ducking NP.  I have reviewed the chart, notes and new data.  I agree with NP's note.  Key new complaints: improved breathing Key examination changes: substantial diuresis, by weight still a little hypervolemic, by exam is difficult to assess volume status Key new findings / data: renal function remains normal after diuresis, normal LV regional and global systolic function; "olateau" troponin elevation  c/w CHF rather than acute coronary event  PLAN: Oral diuretics - continue low dose at discharge. Na restriction/daily weight at home. No further inpatient Cardiology workup planned.  Thurmon Fair, MD, [facility] [phone] 09/24/2014, 9:27 AM

## 2014-09-25 MED ORDER — HYDROCODONE-ACETAMINOPHEN 5-325 MG PO TABS: 1.0000 | ORAL_TABLET | Freq: Four times a day (QID) | ORAL | Status: DC | PRN

## 2014-09-25 MED ORDER — POLYETHYLENE GLYCOL 3350 17 G PO PACK: 17.0000 g | PACK | Freq: Two times a day (BID) | ORAL | Status: AC

## 2014-09-25 MED ORDER — ASPIRIN 81 MG PO CHEW: 81.0000 mg | CHEWABLE_TABLET | Freq: Every day | ORAL | Status: DC

## 2014-09-25 NOTE — Discharge Instructions (Signed)
Follow with Primary MD Julian HySOUTH,STEPHEN ALAN, MD in 7 days   Get CBC, CMP, 2 view Chest X ray checked  by Primary MD next visit.    Activity: As tolerated with Full fall precautions use walker/cane & assistance as needed   Disposition Home    Diet: Heart Healthy with feeding assistance and aspiration precautions as needed. Check your Weight same time everyday, if you gain over 2 pounds, or you develop in leg swelling, experience more shortness of breath or chest pain, call your Primary MD immediately. Follow Cardiac Low Salt Diet and 1.2 lit/day fluid restriction.   On your next visit with your primary care physician please Get Medicines reviewed and adjusted.   Please request your Prim.MD to go over all Hospital Tests and Procedure/Radiological results at the follow up, please get all Hospital records sent to your Prim MD by signing hospital release before you go home.   If you experience worsening of your admission symptoms, develop shortness of breath, life threatening emergency, suicidal or homicidal thoughts you must seek medical attention immediately by calling 911 or calling your MD immediately  if symptoms less severe.  You Must read complete instructions/literature along with all the possible adverse reactions/side effects for all the Medicines you take and that have been prescribed to you. Take any new Medicines after you have completely understood and accpet all the possible adverse reactions/side effects.   Do not drive, operating heavy machinery, perform activities at heights, swimming or participation in water activities or provide baby sitting services if your were admitted for syncope or siezures until you have seen by Primary MD or a Neurologist and advised to do so again.  Do not drive when taking Pain medications.    Do not take more than prescribed Pain, Sleep and Anxiety Medications  Special Instructions: If you have smoked or chewed Tobacco  in the last 2 yrs please  stop smoking, stop any regular Alcohol  and or any Recreational drug use.  Wear Seat belts while driving.   Please note  You were cared for by a hospitalist during your hospital stay. If you have any questions about your discharge medications or the care you received while you were in the hospital after you are discharged, you can call the unit and asked to speak with the hospitalist on call if the hospitalist that took care of you is not available. Once you are discharged, your primary care physician will handle any further medical issues. Please note that NO REFILLS for any discharge medications will be authorized once you are discharged, as it is imperative that you return to your primary care physician (or establish a relationship with a primary care physician if you do not have one) for your aftercare needs so that they can reassess your need for medications and monitor your lab values.

## 2014-09-25 NOTE — Progress Notes (Signed)
Subjective:  Still coughing but says that she is feeling better.  Plans for her to discharge today.    Objective:  Vital Signs in the last 24 hours: BP 110/52 mmHg  Pulse 78  Temp(Src) 97.4 F (36.3 C) (Oral)  Resp 22  Ht 5\' 4"  (1.626 m)  Wt 55.898 kg (123 lb 3.7 oz)  BMI 21.14 kg/m2  SpO2 99%  Physical Exam: Elderly female in no acute distress, coughing  Lungs:  Mild rhonchi Cardiac:  Regular rhythm, normal S1 and S2, no S3, JVP difficult to assess Abdomen:  Soft, nontender, no masses Extremities:  Trace edema present  Intake/Output from previous day: 02/12 0701 - 02/13 0700 In: 1440 [P.O.:1440] Out: 875 [Urine:875] Weight Filed Weights   09/23/14 0520 09/24/14 0529 09/25/14 0654  Weight: 57.6 kg (126 lb 15.8 oz) 56.4 kg (124 lb 5.4 oz) 55.898 kg (123 lb 3.7 oz)    Lab Results: Basic Metabolic Panel:  Recent Labs  16/05/9601/12/16 0454  NA 136  K 4.3  CL 96  CO2 29  GLUCOSE 114*  BUN 28*  CREATININE 0.88   BNP    Component Value Date/Time   BNP 113.7* 09/20/2014 1010    PROTIME: Lab Results  Component Value Date   INR 1.40 03/01/2012   Assessment/Plan:  1.  Acute on chronic diastolic heart failure improved 2.  Flat troponin elevation not suggestive of ischemia 3.  Chronic respiratory failure  Recommendations:  Okay to go home today on a low dose of Lasix at home.  Follow-up in the outpatient with cardiology.   Darden PalmerW. Spencer Ladarrious Kirksey, Jr.  MD [facility]  09/25/2014, 10:36 AM

## 2014-09-25 NOTE — Clinical Social Work Note (Signed)
Clinical Social Worker facilitated patient discharge including contacting patient's grandson, Tiney RougeDonald Owens and left a message with discharge plans as well as a returned phone call and facility to confirm patient discharge plans.  Clinical information faxed to facility and family agreeable with plan.  [facility]  RN to call report prior to discharge.  DC packet prepared and on chart for transport.   Clinical Social Worker will sign off for now as social work intervention is no longer needed. Please consult us again if new need arises.  Derenda FennelBashira Anusha Claus, MSW, LCSWA [phone] 09/25/2014 10:57 AM

## 2014-09-25 NOTE — Progress Notes (Signed)
Report called to [name]

## 2014-09-25 NOTE — Discharge Summary (Signed)
[name] [name], is a 81 y.o. female  DOB Jan 01, 1929  MRN [id number].  Admission date:  09/20/2014  Admitting Physician  Maretta Bees, MD  Discharge Date:  09/25/2014   Primary MD  Julian Hy, MD  Recommendations for primary care physician for things to follow:    monitor BMP, weight, diuretic dose closely   Admission Diagnosis  Acute on chronic respiratory failure with hypoxia [J96.21] Chronic obstructive pulmonary disease, unspecified COPD, unspecified chronic bronchitis type [J44.9] Congestive heart failure, unspecified congestive heart failure chronicity, unspecified congestive heart failure type [I50.9]   Discharge Diagnosis  Acute on chronic respiratory failure with hypoxia [J96.21] Chronic obstructive pulmonary disease, unspecified COPD, unspecified chronic bronchitis type [J44.9] Congestive heart failure, unspecified congestive heart failure chronicity, unspecified congestive heart failure type [I50.9]    Principal Problem:   Acute on chronic respiratory failure with hypoxia Active Problems:   Dyslipidemia   Essential hypertension, benign   Hypothyroidism   COPD exacerbation   Chronic diastolic heart failure   GERD (gastroesophageal reflux disease)   Acute on chronic diastolic CHF (congestive heart failure)   CKD (chronic kidney disease), stage II   Troponin level elevated-felt to be secondary to acute resp failure   Compression fracture of body of thoracic vertebra-wears a brace   RBBB      Past Medical History  Diagnosis Date  . Hypertension   . Hyperlipidemia   . Thyroid disease   . Hypokalemia   . Vitamin D deficiency   . COPD (chronic obstructive pulmonary disease)   . CHF (congestive heart failure)     Past Surgical History  Procedure Laterality Date  .  Cholecystectomy    . Abdominal hysterectomy         History of present illness and  Hospital Course:     Kindly see H&P for history of present illness and admission details, please review complete Labs, Consult reports and Test reports for all details in brief  HPI  from the history and physical done on the day of admission  [name] [name] is a 81 y.o. female, resident of skilled nursing facility/DO NOT RESUSCITATE with known history of O2 dependent COPD and diastolic heart failure as well as hypothyroidism and dyslipidemia. Was sent to the ER from the skilled nursing facility for reported shortness of breath. Her room air saturations were 70-80% when assessed in the ER. She required BiPAP at presentation. Her chest x-ray was consistent with edema. She was given Lasix IV in the ER. She had mild elevation in her BNP. An attempt was made to wean her to nasal cannula from BiPAP but this failed. According to the nursing home records the patient recently had received Rocephin for suspected urinary tract infection. Upon our evaluation difficult to obtain adequate history from patient because of the BiPAP mask. She also seemed to have some difficulty understanding some questions I was asking her. She reports that she fell at some point recently at the nursing home but was unable to tell me whether she  got dizzy first or if she had a mechanical fall. Unable to determine if she has had flulike symptoms preceding current respiratory symptoms.   Hospital Course     .1.Acute respiratory failure secondary to acute on chronic diastolic CHF EF 65% on recent echogram -improved and today switched to oral Lasix continue salt and fluid restriction, supportive care. Added low-dose beta blocker. Close to baseline. Cardiology consulted, candidate for medical management only. Continue monitoring weight, Lasix and BMP.  Filed Weights   09/22/14 0530 09/23/14 0520 09/24/14 0529  Weight: 57.586 kg  (126 lb 15.3 oz) 57.6 kg (126 lb 15.8 oz) 56.4 kg (124 lb 5.4 oz)     2. Dyslipidemia. On statin continue.    3. Mild troponin elevation likely due to #1 above. Troponin rise is not in ACS pattern. On aspirin, statin, low-dose beta blocker will be added for secondary prevention. Cardiology following.    4. Low back pain after a fall that she sustained at nursing home. X-ray shows T12 superior endplate fracture with 30% height loss, poor candidate for surgical correction, supportive care, placed in TLSO brace when out of the bed for 3 months then follow with neurosurgery in the office. Have discussed with neurosurgeon Dr Franky Macho over the phone 09-22-14. No lower extremity weakness or signs of cauda equina.    5. Hypothyroidism. On Synthroid continue.    6. GERD. On PPI.        Discharge Condition: Stable   Follow UP  Follow-up Information    Follow up with Julian Hy, MD. Schedule an appointment as soon as possible for a visit in 1 week.   Specialty:  Endocrinology   Contact information:   [address] [place] [address] [phone]       Follow up with Thurmon Fair, MD. Schedule an appointment as soon as possible for a visit in 1 week.   Specialty:  Cardiology   Contact information:   [address] [place] [address] [phone]       Follow up with Marinell Blight, MD. Schedule an appointment as soon as possible for a visit in 1 week.   Specialty:  Neurology   Contact information:   [address] [place] [address] [phone]         Discharge Instructions  and  Discharge Medications          Discharge Instructions    Diet - low sodium heart healthy    Complete by:  As directed      Discharge instructions    Complete by:  As directed   Follow with Primary MD Julian Hy, MD in 7 days   Get CBC, CMP, 2 view Chest X ray checked  by Primary MD next visit.    Activity: As tolerated with Full  fall precautions use walker/cane & assistance as needed   Disposition Home    Diet: Heart Healthy with feeding assistance and aspiration precautions as needed. Check your Weight same time everyday, if you gain over 2 pounds, or you develop in leg swelling, experience more shortness of breath or chest pain, call your Primary MD immediately. Follow Cardiac Low Salt Diet and 1.2 lit/day fluid restriction.   On your next visit with your primary care physician please Get Medicines reviewed and adjusted.   Please request your Prim.MD to go over all Hospital Tests and Procedure/Radiological results at the follow up, please get all Hospital records sent to your Prim MD by signing hospital release  before you go home.   If you experience worsening of your admission symptoms, develop shortness of breath, life threatening emergency, suicidal or homicidal thoughts you must seek medical attention immediately by calling 911 or calling your MD immediately  if symptoms less severe.  You Must read complete instructions/literature along with all the possible adverse reactions/side effects for all the Medicines you take and that have been prescribed to you. Take any new Medicines after you have completely understood and accpet all the possible adverse reactions/side effects.   Do not drive, operating heavy machinery, perform activities at heights, swimming or participation in water activities or provide baby sitting services if your were admitted for syncope or siezures until you have seen by Primary MD or a Neurologist and advised to do so again.  Do not drive when taking Pain medications.    Do not take more than prescribed Pain, Sleep and Anxiety Medications  Special Instructions: If you have smoked or chewed Tobacco  in the last 2 yrs please stop smoking, stop any regular Alcohol  and or any Recreational drug use.  Wear Seat belts while driving.   Please note  You were cared for by a hospitalist  during your hospital stay. If you have any questions about your discharge medications or the care you received while you were in the hospital after you are discharged, you can call the unit and asked to speak with the hospitalist on call if the hospitalist that took care of you is not available. Once you are discharged, your primary care physician will handle any further medical issues. Please note that NO REFILLS for any discharge medications will be authorized once you are discharged, as it is imperative that you return to your primary care physician (or establish a relationship with a primary care physician if you do not have one) for your aftercare needs so that they can reassess your need for medications and monitor your lab values.     Increase activity slowly    Complete by:  As directed             Medication List    STOP taking these medications        amLODipine 10 MG tablet  Commonly known as:  NORVASC     aspirin 325 MG tablet  Replaced by:  aspirin 81 MG chewable tablet     cefTRIAXone 1 G injection  Commonly known as:  ROCEPHIN     furosemide 10 MG/ML solution  Commonly known as:  LASIX  Replaced by:  furosemide 40 MG tablet     levofloxacin 750 MG tablet  Commonly known as:  LEVAQUIN     methylPREDNISolone sodium succinate 125 mg/2 mL injection  Commonly known as:  SOLU-MEDROL     metoprolol 50 MG tablet  Commonly known as:  LOPRESSOR      TAKE these medications        albuterol (2.5 MG/3ML) 0.083% nebulizer solution  Commonly known as:  PROVENTIL  Take 2.5 mg by nebulization 4 (four) times daily.     aspirin 81 MG chewable tablet  Chew 1 tablet (81 mg total) by mouth daily.     carvedilol 3.125 MG tablet  Commonly known as:  COREG  Take 1 tablet (3.125 mg total) by mouth 2 (two) times daily with a meal.     feeding supplement (ENSURE COMPLETE) Liqd  Take 237 mLs by mouth 2 (two) times daily between meals.     furosemide 40 MG tablet  Commonly known  as:  LASIX  Take 1 tablet (40 mg total) by mouth daily.     HYDROcodone-acetaminophen 5-325 MG per tablet  Commonly known as:  NORCO/VICODIN  Take 1 tablet by mouth every 6 (six) hours as needed for moderate pain.     ipratropium 0.02 % nebulizer solution  Commonly known as:  ATROVENT  Take 0.5 mg by nebulization 4 (four) times daily.     levothyroxine 125 MCG tablet  Commonly known as:  SYNTHROID, LEVOTHROID  Take 125 mcg by mouth daily before breakfast.     lisinopril 2.5 MG tablet  Commonly known as:  PRINIVIL,ZESTRIL  Take 1 tablet (2.5 mg total) by mouth daily.     omeprazole 20 MG capsule  Commonly known as:  PRILOSEC  Take 20 mg by mouth daily.     polyethylene glycol packet  Commonly known as:  MIRALAX / GLYCOLAX  Take 17 g by mouth 2 (two) times daily.     potassium chloride SA 20 MEQ tablet  Commonly known as:  K-DUR,KLOR-CON  Take 20 mEq by mouth daily.     pravastatin 40 MG tablet  Commonly known as:  PRAVACHOL  Take 40 mg by mouth at bedtime.     Vitamin D (Cholecalciferol) 1000 UNITS Tabs  Take 1,000 Units by mouth daily.          Diet and Activity recommendation: See Discharge Instructions above   Consults obtained - Cards   Major procedures and Radiology Reports - PLEASE review detailed and final reports for all details, in brief -    TTE 05-2014  - Left ventricle: The cavity size was normal. Wall thickness wasincreased in a pattern of moderate LVH. Systolic function wasnormal. The estimated ejection fraction was in the range of 60%to 65%. Doppler parameters are consistent with abnormal leftventricular relaxation (grade 1 diastolic dysfunction). - Aortic valve: Mildly calcified annulus. Mildly thickenedleaflets. Valve area (VTI): 2.47 cm^2. Valve area (Vmax): 2.26cm^2. - Atrial septum: No defect or patent foramen ovale was identified.    Dg Thoracic Spine 2 View  09/22/2014   CLINICAL DATA:  81 year old female with thoracic spine pain   EXAM: THORACIC SPINE - 2 VIEW  COMPARISON:  Prior chest x-ray 09/20/2014 and 05/15/2014  FINDINGS: The bones are osteopenic. Compression fracture involving the superior endplate of T12. There is approximately 30% height loss anteriorly. Interval development of progressive exaggeration of the thoracic curvature. This finding was not present on the prior two-view chest x-ray from October of 2015. Is difficult to tell if the fracture was present on the more recent chest x-rays from several days previously. The remaining vertebral body heights are maintained. Patchy airspace opacity in the left lower lobe favored to reflect atelectasis.  IMPRESSION: 1. Compared to May 15, 2014 interval development of an acute or subacute compression fracture involving the superior endplate of T12 with approximately 30% height loss. No evidence of posterior retropulsion. If confirmation of acuity is clinically warranted, consider further evaluation with MRI of the thoracic spine. 2. Incompletely imaged patchy left lower lobe opacity may reflect atelectasis or infiltrate. 3. Aortic atherosclerosis.   Electronically Signed   By: Malachy Moan M.D.   On: 09/22/2014 10:03   Dg Lumbar Spine Complete  09/22/2014   CLINICAL DATA:  Lower back pain.  EXAM: LUMBAR SPINE - COMPLETE 4+ VIEW  COMPARISON:  None.  FINDINGS: Diffuse osteopenia is noted. No fracture or significant spondylolisthesis is noted. Moderate degenerative disc disease is noted at L3-4 and L4-5.  Mild compression deformity of T11 vertebral body is noted consistent with fracture of indeterminate age. No fracture or spondylolisthesis is noted in the lumbar spine.  IMPRESSION: Mild compression deformity of T11 vertebral body is noted consistent with fracture of indeterminate age. Moderate multilevel degenerative disc disease is noted in the lower lumbar spine. No acute abnormality seen in the lumbar spine.   Electronically Signed   By: Lupita Raider, M.D.   On: 09/22/2014  10:33   Dg Chest Port 1 View  09/20/2014   CLINICAL DATA:  Sudden onset of shortness of breath.  EXAM: PORTABLE CHEST - 1 VIEW  COMPARISON:  09/20/2014 at 10:29 a.m.  FINDINGS: Interstitial accentuation is again noted along with airspace opacity at the right lung base. Improved conspicuity of the right hemidiaphragm. Mild airspace opacity at the left lung base.  Atherosclerotic calcification of the aortic arch. Upper normal heart size.  IMPRESSION: 1. Similar appearance to earlier today, bilateral interstitial opacity favoring interstitial edema, and bibasilar airspace opacities which could be from confluent edema or pneumonia.   Electronically Signed   By: Herbie Baltimore M.D.   On: 09/20/2014 18:37   Dg Chest Portable 1 View  09/20/2014   CLINICAL DATA:  Shortness of breath.  EXAM: PORTABLE CHEST - 1 VIEW  COMPARISON:  May 15, 2014.  FINDINGS: Stable cardiomediastinal silhouette. Patient is rotated to the left. No pneumothorax or significant pleural effusion is noted. Mild bilateral perihilar and basilar interstitial densities are noted consistent with pulmonary edema. Bony thorax is intact.  IMPRESSION: Findings consistent with mild bilateral pulmonary edema.   Electronically Signed   By: Roque Lias M.D.   On: 09/20/2014 11:25    Micro Results      Recent Results (from the past 240 hour(s))  Urine culture     Status: None   Collection Time: 09/20/14 12:27 PM  Result Value Ref Range Status   Specimen Description URINE, CATHETERIZED  Final   Special Requests NONE  Final   Colony Count NO GROWTH Performed at [facility]   Final   Culture NO GROWTH Performed at [facility]   Final   Report Status 09/21/2014 FINAL  Final  MRSA PCR Screening     Status: None   Collection Time: 09/20/14  6:42 PM  Result Value Ref Range Status   MRSA by PCR NEGATIVE NEGATIVE Final    Comment:        The GeneXpert MRSA Assay (FDA approved for NASAL specimens only), is one component of  a comprehensive MRSA colonization surveillance program. It is not intended to diagnose MRSA infection nor to guide or monitor treatment for MRSA infections.        Today   Subjective:   [name] [name] today has no headache,no chest abdominal pain,no new weakness tingling or numbness, feels much better wants to go home today.    Objective:   Blood pressure 110/52, pulse 78, temperature 97.4 F (36.3 C), temperature source Oral, resp. rate 22, height 5\' 4"  (1.626 m), weight 55.898 kg (123 lb 3.7 oz), SpO2 99 %.   Intake/Output Summary (Last 24 hours) at 09/25/14 0936 Last data filed at 09/25/14 0900  Gross per 24 hour  Intake   1680 ml  Output    875 ml  Net    805 ml    Exam Awake Alert, Oriented x 3, No new F.N deficits, Normal affect Brown.AT,PERRAL Supple Neck,No JVD, No cervical lymphadenopathy appriciated.  Symmetrical Chest wall movement,  Good air movement bilaterally, CTAB RRR,No Gallops,Rubs or new Murmurs, No Parasternal Heave +ve B.Sounds, Abd Soft, Non tender, No organomegaly appriciated, No rebound -guarding or rigidity. No Cyanosis, Clubbing or edema, No new Rash or bruise  Data Review   CBC w Diff:  Lab Results  Component Value Date   WBC 5.8 09/22/2014   HGB 11.8* 09/22/2014   HCT 37.1 09/22/2014   PLT 172 09/22/2014   LYMPHOPCT 9* 09/20/2014   MONOPCT 9 09/20/2014   EOSPCT 4 09/20/2014   BASOPCT 1 09/20/2014    CMP:  Lab Results  Component Value Date   NA 136 09/24/2014   K 4.3 09/24/2014   CL 96 09/24/2014   CO2 29 09/24/2014   BUN 28* 09/24/2014   CREATININE 0.88 09/24/2014   PROT 5.3* 09/21/2014   ALBUMIN 2.5* 09/21/2014   BILITOT 0.8 09/21/2014   ALKPHOS 72 09/21/2014   AST 14 09/21/2014   ALT 11 09/21/2014  .   Total Time in preparing paper work, data evaluation and todays exam - 35 minutes  Leroy SeaSINGH,Ranbir Chew K M.D on 09/25/2014 at 9:36 AM  [facility] Office  [phone]

## 2014-10-20 ENCOUNTER — Encounter: Payer: Self-pay | Admitting: Cardiology

## 2014-10-20 ENCOUNTER — Ambulatory Visit (INDEPENDENT_AMBULATORY_CARE_PROVIDER_SITE_OTHER): Payer: Medicare Other | Admitting: Cardiology

## 2014-10-20 VITALS — BP 130/62 | HR 76 | Ht 64.0 in

## 2014-10-20 DIAGNOSIS — Z79899 Other long term (current) drug therapy: Secondary | ICD-10-CM

## 2014-10-20 NOTE — Progress Notes (Signed)
Cardiology Office Note   Date:  10/20/2014   ID:  [facility], DOB November 27, 1928, MRN [id number]  PCP:  Julian Hy, MD  Cardiologist:  Dr. Eloy End    Chief Complaint  Patient presents with  . Hospitalization Follow-up    Post Hospital:        History of Present Illness: [name] [name] is a 81 y.o. female who presents for post hospital follow up. 81 y.o. female, resident of skilled nursing facility/DO NOT RESUSCITATE with known history of O2 dependent COPD and diastolic heart failure as well as hypothyroidism and dyslipidemia.  Admitted for SOB, respiratory failure with BiPAP.   She was diuresed and her urinary tract infection was treated.  Dr. Delton See saw her in consult. Patient was done quite well her troponins were mildly elevated during hospitalization which was felt due to demand ischemia. No further cardiac workup was deemed necessary.  Echo with EF 55-60% and grade 1 diastolic dysfunction. The atrial septum had increased thickness of the septum consistent with lipomatous hypertrophy.    Today she presents for follow-up and is feeling fairly well her family with her states that she actually looks better than she has in years.  She complains about the MiraLAX and is making her have too many stools she has so he cut it back to once a day which I did. Her blood pressure is stable she has no chest pain some shortness of breath but overall she feels better. They do weigh her at the nursing home sheet she tells me around 120 pounds. We could not weigh her here today. She is in a wheelchair.    Past Medical History  Diagnosis Date  . Hypertension   . Hyperlipidemia   . Thyroid disease   . Hypokalemia   . Vitamin D deficiency   . COPD (chronic obstructive pulmonary disease)   . CHF (congestive heart failure)     Past Surgical History  Procedure Laterality Date  . Cholecystectomy    . Abdominal hysterectomy       Current Outpatient Prescriptions    Medication Sig Dispense Refill  . albuterol (PROVENTIL) (2.5 MG/3ML) 0.083% nebulizer solution Take 2.5 mg by nebulization 4 (four) times daily.    Marland Kitchen aspirin 81 MG chewable tablet Chew 1 tablet (81 mg total) by mouth daily. 30 tablet 0  . carvedilol (COREG) 3.125 MG tablet Take 1 tablet (3.125 mg total) by mouth 2 (two) times daily with a meal. 30 tablet 0  . feeding supplement, ENSURE COMPLETE, (ENSURE COMPLETE) LIQD Take 237 mLs by mouth 2 (two) times daily between meals. 90 Bottle 0  . furosemide (LASIX) 40 MG tablet Take 1 tablet (40 mg total) by mouth daily. 30 tablet 0  . HYDROcodone-acetaminophen (NORCO/VICODIN) 5-325 MG per tablet Take 1 tablet by mouth every 6 (six) hours as needed for moderate pain. 30 tablet 0  . ipratropium (ATROVENT) 0.02 % nebulizer solution Take 0.5 mg by nebulization 4 (four) times daily.    Marland Kitchen levothyroxine (SYNTHROID, LEVOTHROID) 137 MCG tablet Take 137 mcg by mouth daily before breakfast.    . lisinopril (PRINIVIL,ZESTRIL) 2.5 MG tablet Take 1 tablet (2.5 mg total) by mouth daily. 30 tablet 1  . omeprazole (PRILOSEC) 20 MG capsule Take 20 mg by mouth daily.    . polyethylene glycol (MIRALAX / GLYCOLAX) packet Take 17 g by mouth 2 (two) times daily. 14 each 0  . potassium chloride SA (K-DUR,KLOR-CON) 20 MEQ tablet Take 20 mEq by mouth daily.    Marland Kitchen  pravastatin (PRAVACHOL) 40 MG tablet Take 40 mg by mouth at bedtime.    . Vitamin D, Cholecalciferol, 1000 UNITS TABS Take 1,000 Units by mouth daily.     No current facility-administered medications for this visit.    Allergies:   Review of patient's allergies indicates no known allergies.    Social History:  The patient  reports that she has quit smoking. Her smoking use included Cigarettes. She has never used smokeless tobacco. She reports that she does not drink alcohol or use illicit drugs.   Family History:  The patient's family history includes Heart attack in her brother and mother.    ROS:  General:no  colds or fevers, no weight changes since discharge per pt Skin:no rashes or ulcers HEENT:no blurred vision, no congestion CV:see HPI PUL:see HPI GI:no diarrhea though freq. Stools, no constipation or melena, no indigestion GU:no hematuria, no dysuria MS:no joint pain, no claudication Neuro:no syncope, no lightheadedness Endo:no diabetes, no thyroid disease  Wt Readings from Last 3 Encounters:  09/25/14 123 lb 3.7 oz (55.898 kg)  05/19/14 134 lb 7.7 oz (61 kg)  12/23/12 123 lb (55.792 kg)     PHYSICAL EXAM: VS:  BP 130/62 mmHg  Pulse 76  Ht 5\' 4"  (1.626 m)  Wt  , BMI Body mass index is 0.00 kg/(m^2). General:Pleasant affect, NAD, in the wheelchair, her greatest concern is too many bowel movements Skin:Warm and dry, brisk capillary refill HEENT:normocephalic, sclera clear, mucus membranes moist Neck:supple, no JVD, no bruits  Heart:S1S2 RRR without murmur, gallup, rub or click Lungs:clear without rales, rhonchi, or wheezes AVW:UJWJAbd:soft, non tender, + BS, do not palpate liver spleen or masses Ext:no lower ext edema, 2+ pedal pulses, 2+ radial pulses Neuro:alert and oriented, MAE, follows commands, + facial symmetry    EKG:  EKG is not ordered today.  In the hospital she had a right bundle branch block and new Q-wave in her inferior leads but no changes on her echo   Recent Labs: 05/17/2014: Pro B Natriuretic peptide (BNP) 690.8* 09/20/2014: B Natriuretic Peptide 113.7* 09/21/2014: ALT 11 09/22/2014: Hemoglobin 11.8*; Platelets 172 09/24/2014: BUN 28*; Creatinine 0.88; Potassium 4.3; Sodium 136    Lipid Panel No results found for: CHOL, TRIG, HDL, CHOLHDL, VLDL, LDLCALC, LDLDIRECT     Other studies Reviewed: Additional studies/ records that were reviewed today include: Hospital notes.   ASSESSMENT AND PLAN:  1.  Recent acute on chronic diastolic heart failure now diuresed and improvement of her respiratory status. Will check a basic metabolic panel to the nursing home she  said [name].  She will follow-up with Dr. Delton SeeNelson in the next 4-5 weeks  2. Elevated troponins no further cardiac workup is necessary at this point felt to be due to demand ischemia. Patient is DO NOT RESUSCITATE. Her echo was stable so no further cardiac testing was needed.  3. Hypertension controlled  4. Hyperlipidemia on pravastatin.  5.  Frequent bowel movements with MiraLAX twice a day will decrease to once daily.   Current medicines are reviewed with the patient today.  The patient Has no concerns regarding medicines.  The following changes have been made:  See above Labs/ tests ordered today include:see above  Disposition:   FU:  see above  Nyoka LintSigned, INGOLD,LAURA R, NP  10/20/2014 6:52 PM    [facility] [address], [place], [place]  [address]/ [address] [place], [place] Phone: [phone]; Fax: [phone]  [phone]

## 2014-10-20 NOTE — Patient Instructions (Signed)
Continue current medications.  Your physician recommends that you return for lab work in: this week.  Your physician recommends that you schedule a follow-up appointment in: Dr. Delton SeeNelson in 4 weeks.

## 2014-11-06 ENCOUNTER — Inpatient Hospital Stay (HOSPITAL_COMMUNITY)
Admission: EM | Admit: 2014-11-06 | Discharge: 2014-11-11 | DRG: 191 | Disposition: A | Discharge status: Skilled Nursing Facility | Payer: Medicare Other | Attending: Internal Medicine | Admitting: Internal Medicine

## 2014-11-06 ENCOUNTER — Emergency Department (HOSPITAL_COMMUNITY): Payer: Medicare Other

## 2014-11-06 ENCOUNTER — Encounter (HOSPITAL_COMMUNITY): Payer: Self-pay | Admitting: Emergency Medicine

## 2014-11-06 DIAGNOSIS — E559 Vitamin D deficiency, unspecified: Secondary | ICD-10-CM | POA: Diagnosis present

## 2014-11-06 DIAGNOSIS — K219 Gastro-esophageal reflux disease without esophagitis: Secondary | ICD-10-CM | POA: Diagnosis present

## 2014-11-06 DIAGNOSIS — N183 Chronic kidney disease, stage 3 (moderate): Secondary | ICD-10-CM | POA: Diagnosis present

## 2014-11-06 DIAGNOSIS — I5032 Chronic diastolic (congestive) heart failure: Secondary | ICD-10-CM | POA: Diagnosis present

## 2014-11-06 DIAGNOSIS — Z7982 Long term (current) use of aspirin: Secondary | ICD-10-CM

## 2014-11-06 DIAGNOSIS — I129 Hypertensive chronic kidney disease with stage 1 through stage 4 chronic kidney disease, or unspecified chronic kidney disease: Secondary | ICD-10-CM | POA: Diagnosis present

## 2014-11-06 DIAGNOSIS — Z9049 Acquired absence of other specified parts of digestive tract: Secondary | ICD-10-CM | POA: Diagnosis present

## 2014-11-06 DIAGNOSIS — Z66 Do not resuscitate: Secondary | ICD-10-CM | POA: Diagnosis present

## 2014-11-06 DIAGNOSIS — Z90710 Acquired absence of both cervix and uterus: Secondary | ICD-10-CM

## 2014-11-06 DIAGNOSIS — J441 Chronic obstructive pulmonary disease with (acute) exacerbation: Principal | ICD-10-CM | POA: Diagnosis present

## 2014-11-06 DIAGNOSIS — E039 Hypothyroidism, unspecified: Secondary | ICD-10-CM | POA: Diagnosis present

## 2014-11-06 DIAGNOSIS — E785 Hyperlipidemia, unspecified: Secondary | ICD-10-CM | POA: Diagnosis present

## 2014-11-06 DIAGNOSIS — Z87891 Personal history of nicotine dependence: Secondary | ICD-10-CM

## 2014-11-06 DIAGNOSIS — I10 Essential (primary) hypertension: Secondary | ICD-10-CM | POA: Diagnosis present

## 2014-11-06 DIAGNOSIS — I4510 Unspecified right bundle-branch block: Secondary | ICD-10-CM | POA: Diagnosis present

## 2014-11-06 DIAGNOSIS — Z79899 Other long term (current) drug therapy: Secondary | ICD-10-CM

## 2014-11-06 DIAGNOSIS — R197 Diarrhea, unspecified: Secondary | ICD-10-CM | POA: Diagnosis present

## 2014-11-06 DIAGNOSIS — N182 Chronic kidney disease, stage 2 (mild): Secondary | ICD-10-CM | POA: Diagnosis present

## 2014-11-06 DIAGNOSIS — R109 Unspecified abdominal pain: Secondary | ICD-10-CM

## 2014-11-06 HISTORY — DX: Gastro-esophageal reflux disease without esophagitis: K21.9

## 2014-11-06 HISTORY — DX: Chronic kidney disease, stage 3 (moderate): N18.3

## 2014-11-06 HISTORY — DX: Chronic kidney disease, stage 3 unspecified: N18.30

## 2014-11-06 LAB — BASIC METABOLIC PANEL
ANION GAP: 6 (ref 5–15)
BUN: 19 mg/dL (ref 6–23)
CO2: 31 mmol/L (ref 19–32)
CREATININE: 1.09 mg/dL (ref 0.50–1.10)
Calcium: 9.2 mg/dL (ref 8.4–10.5)
Chloride: 102 mmol/L (ref 96–112)
GFR calc non Af Amer: 46 mL/min — ABNORMAL LOW (ref >90)
GFR, EST AFRICAN AMERICAN: 53 mL/min — AB (ref >90)
GLUCOSE: 117 mg/dL — AB (ref 70–99)
Potassium: 4.1 mmol/L (ref 3.5–5.1)
Sodium: 139 mmol/L (ref 135–145)

## 2014-11-06 LAB — CBC
HEMATOCRIT: 43.9 % (ref 36.0–46.0)
Hemoglobin: 14 g/dL (ref 12.0–15.0)
MCH: 29.4 pg (ref 26.0–34.0)
MCHC: 31.9 g/dL (ref 30.0–36.0)
MCV: 92 fL (ref 78.0–100.0)
Platelets: 208 10*3/uL (ref 150–400)
RBC: 4.77 MIL/uL (ref 3.87–5.11)
RDW: 14.5 % (ref 11.5–15.5)
WBC: 7.9 10*3/uL (ref 4.0–10.5)

## 2014-11-06 LAB — I-STAT TROPONIN, ED: TROPONIN I, POC: 0.01 ng/mL (ref 0.00–0.08)

## 2014-11-06 LAB — BRAIN NATRIURETIC PEPTIDE: B Natriuretic Peptide: 75.8 pg/mL (ref 0.0–100.0)

## 2014-11-06 MED ORDER — IPRATROPIUM BROMIDE 0.02 % IN SOLN
0.5000 mg | Freq: Once | RESPIRATORY_TRACT | Status: AC
  Administered 2014-11-06: 0.5 mg via RESPIRATORY_TRACT
  Filled 2014-11-06: qty 2.5

## 2014-11-06 MED ORDER — ALBUTEROL SULFATE (2.5 MG/3ML) 0.083% IN NEBU
5.0000 mg | INHALATION_SOLUTION | Freq: Once | RESPIRATORY_TRACT | Status: AC
  Administered 2014-11-06: 5 mg via RESPIRATORY_TRACT
  Filled 2014-11-06: qty 6

## 2014-11-06 NOTE — ED Notes (Signed)
Contact pt's grandson Edwinna AreolaSteve Owens @ [phone] with any changes

## 2014-11-06 NOTE — ED Provider Notes (Signed)
CSN: [id number]     Arrival date & time 11/06/14  2154 History   First MD Initiated Contact with Patient 11/06/14 2236     Chief Complaint  Patient presents with  . Shortness of Breath     (Consider location/radiation/quality/duration/timing/severity/associated sxs/prior Treatment) HPI Comments: Patient is an 81 y/o F with a hx of HTN, HLD, COPD on 2L O2 via [place] PRN, and CHF who presents to the ED for shortness of breath. Patient states that she has felt increasingly short of breath over the last few days; EMS reports sudden onset of respiratory distress at SNF this evening. Patient reports a cough productive of white phlegm with pain in her L upper chest which is worse with breathing and improves when she is placed on supplemental O2. Per EMS, patient given albuterol neb tx at facility which improved O2 sats from 80's to low 90's. 5mg  albuterol given by EMS as well as 125mg  Solu-Medrol. Patient denies fever, vomiting, and syncope.  PCP - Dr. Evlyn [place]  Patient is a 81 y.o. female presenting with shortness of breath. The history is provided by the patient. No language interpreter was used.  Shortness of Breath Associated symptoms: chest pain and cough   Associated symptoms: no fever and no vomiting     Past Medical History  Diagnosis Date  . Hypertension   . Hyperlipidemia   . Thyroid disease   . Hypokalemia   . Vitamin D deficiency   . COPD (chronic obstructive pulmonary disease)   . CHF (congestive heart failure)   . GERD (gastroesophageal reflux disease)   . CKD (chronic kidney disease), stage III    Past Surgical History  Procedure Laterality Date  . Cholecystectomy    . Abdominal hysterectomy     Family History  Problem Relation Age of Onset  . Heart attack Mother   . Heart attack Brother    History  Substance Use Topics  . Smoking status: Former Smoker    Types: Cigarettes  . Smokeless tobacco: Never Used     Comment: quit smoking years ago  . Alcohol Use: No   OB  History    No data available      Review of Systems  Constitutional: Negative for fever.  Respiratory: Positive for cough and shortness of breath.   Cardiovascular: Positive for chest pain.  Gastrointestinal: Negative for vomiting.  Neurological: Positive for light-headedness. Negative for syncope.  All other systems reviewed and are negative.   Allergies  Review of patient's allergies indicates no known allergies.  Home Medications   Prior to Admission medications   Medication Sig Start Date End Date Taking? Authorizing Provider  albuterol (PROVENTIL) (2.5 MG/3ML) 0.083% nebulizer solution Take 2.5 mg by nebulization 4 (four) times daily.    Historical Provider, MD  aspirin 81 MG chewable tablet Chew 1 tablet (81 mg total) by mouth daily. 09/25/14   Leroy SeaPrashant K Singh, MD  carvedilol (COREG) 3.125 MG tablet Take 1 tablet (3.125 mg total) by mouth 2 (two) times daily with a meal. 09/24/14   Leroy SeaPrashant K Singh, MD  feeding supplement, ENSURE COMPLETE, (ENSURE COMPLETE) LIQD Take 237 mLs by mouth 2 (two) times daily between meals. 09/24/14   Leroy SeaPrashant K Singh, MD  furosemide (LASIX) 40 MG tablet Take 1 tablet (40 mg total) by mouth daily. 09/25/14   Leroy SeaPrashant K Singh, MD  HYDROcodone-acetaminophen (NORCO/VICODIN) 5-325 MG per tablet Take 1 tablet by mouth every 6 (six) hours as needed for moderate pain. 09/25/14   Prashant K  Thedore Mins, MD  ipratropium (ATROVENT) 0.02 % nebulizer solution Take 0.5 mg by nebulization 4 (four) times daily.    Historical Provider, MD  levothyroxine (SYNTHROID, LEVOTHROID) 137 MCG tablet Take 137 mcg by mouth daily before breakfast.    Historical Provider, MD  lisinopril (PRINIVIL,ZESTRIL) 2.5 MG tablet Take 1 tablet (2.5 mg total) by mouth daily. 05/18/14   Hollice Espy, MD  omeprazole (PRILOSEC) 20 MG capsule Take 20 mg by mouth daily.    Historical Provider, MD  polyethylene glycol (MIRALAX / GLYCOLAX) packet Take 17 g by mouth 2 (two) times daily. 09/25/14   Leroy Sea, MD  potassium chloride SA (K-DUR,KLOR-CON) 20 MEQ tablet Take 20 mEq by mouth daily.    Historical Provider, MD  pravastatin (PRAVACHOL) 40 MG tablet Take 40 mg by mouth at bedtime.    Historical Provider, MD  Vitamin D, Cholecalciferol, 1000 UNITS TABS Take 1,000 Units by mouth daily.    Historical Provider, MD   BP 108/48 mmHg  Pulse 94  Temp(Src) 97.6 F (36.4 C) (Oral)  Resp 16  SpO2 97%   Physical Exam  Constitutional: She is oriented to person, place, and time. She appears well-developed and well-nourished. No distress.  Nontoxic/nonseptic appearing  HENT:  Head: Normocephalic and atraumatic.  Eyes: Conjunctivae and EOM are normal. No scleral icterus.  Neck: Normal range of motion.  Cardiovascular: Normal rate, regular rhythm and intact distal pulses.   Pulmonary/Chest: Effort normal. No respiratory distress. She has decreased breath sounds. She has rhonchi.  Decreased breath sounds diffusely with expiratory rhonchi. No rales. Mild tachypnea and sentences are mildly truncated.  Abdominal: Soft. She exhibits no distension. There is no tenderness. There is no rebound.  Soft, nontender.  Musculoskeletal: Normal range of motion.  Neurological: She is alert and oriented to person, place, and time. She exhibits normal muscle tone. Coordination normal.  GCS 15. Patient moves extremities without ataxia.  Skin: Skin is warm and dry. No rash noted. She is not diaphoretic. No erythema. No pallor.  Psychiatric: She has a normal mood and affect. Her behavior is normal.  Nursing note and vitals reviewed.   ED Course  Procedures (including critical care time) Labs Review Labs Reviewed  BASIC METABOLIC PANEL - Abnormal; Notable for the following:    Glucose, Bld 117 (*)    GFR calc non Af Amer 46 (*)    GFR calc Af Amer 53 (*)    All other components within normal limits  CBC  BRAIN NATRIURETIC PEPTIDE  I-STAT TROPOININ, ED    Imaging Review Dg Chest 2 View (if Patient  Has Fever And/or Copd)  11/06/2014   CLINICAL DATA:  Sudden onset respiratory distress. Low O2 sats. History of CHF, COPD.  EXAM: CHEST  2 VIEW  COMPARISON:  09/20/2014  FINDINGS: Heart is borderline in size. Mediastinal contours are within normal limits. Predominately linear opacities in the right middle lobe, likely atelectasis. Mild fullness in the right hilum. No focal opacity on the left. No effusions.  Mild compression fracture in the lower thoracic spine, likely chronic. Diffuse osteopenia.  IMPRESSION: Right middle lobe atelectasis. Fullness in the right hilar region. This may be vascular, but I cannot exclude right hilar adenopathy or mass.   Electronically Signed   By: Charlett Nose M.D.   On: 11/06/2014 22:46      EKG Interpretation   Date/Time:  Saturday November 06 2014 21:59:31 EDT Ventricular Rate:  87 PR Interval:  169 QRS Duration: 154 QT Interval:  405 QTC Calculation: 487 R Axis:   70 Text Interpretation:  Age not entered, assumed to be  81 years old for  purpose of ECG interpretation Sinus rhythm Right bundle branch block  Confirmed by COOK  MD, BRIAN ([id number]) on 11/06/2014 10:31:23 PM       Medications  albuterol (PROVENTIL,VENTOLIN) solution continuous neb (10 mg Nebulization New Bag/Given 11/07/14 0139)  albuterol (PROVENTIL, VENTOLIN) (5 MG/ML) 0.5% continuous inhalation solution (not administered)  sodium chloride 0.9 % bolus 1,000 mL (not administered)  albuterol (PROVENTIL) (2.5 MG/3ML) 0.083% nebulizer solution 5 mg (not administered)  ipratropium (ATROVENT) nebulizer solution 0.5 mg (not administered)  albuterol (PROVENTIL) (2.5 MG/3ML) 0.083% nebulizer solution 5 mg (5 mg Nebulization Given 11/06/14 2305)  ipratropium (ATROVENT) nebulizer solution 0.5 mg (0.5 mg Nebulization Given 11/06/14 2305)    CRITICAL CARE Performed by: Antony Madura   Total critical care time: 31  Critical care time was exclusive of separately billable procedures and treating other  patients.  Critical care was necessary to treat or prevent imminent or life-threatening deterioration.  Critical care was time spent personally by me on the following activities: development of treatment plan with patient and/or surrogate as well as nursing, discussions with consultants, evaluation of patient's response to treatment, examination of patient, obtaining history from patient or surrogate, ordering and performing treatments and interventions, ordering and review of laboratory studies, ordering and review of radiographic studies, pulse oximetry and re-evaluation of patient's condition.  MDM   Final diagnoses:  COPD exacerbation    81 year old female with known history of COPD presents to the emergency department for worsening shortness of breath. Clinical picture consistent with COPD exacerbation. Patient continues to desaturate to the 80's with 2L O2 nasal cannula with ambulation despite treatment with Solu-Medrol, DuoNeb 3, and continuous albuterol treatment. Will admit for further management of COPD exacerbation. Case discussed with Dr. Clyde Lundborg of [facility] who will admit. Temp admit orders placed. Patient seen also by Dr. Lynelle Doctor who is agreeable to plan.   Filed Vitals:   11/07/14 0345 11/07/14 0400 11/07/14 0415 11/07/14 0430  BP: 132/65 117/56 124/53 108/48  Pulse: 95 96 99 94  Temp:      TempSrc:      Resp: SpO2: 92% 96% 95% 97%     Antony Madura, PA-C 11/07/14 6045  Devoria Albe, MD 11/07/14 4098

## 2014-11-06 NOTE — ED Notes (Signed)
Pt arrives from [facility] with sudden onset resp distress, SNF found O2 sats in the 80s, applied neb treatment and sats up to low 90s. One duoneb given by EMS. 20G LAC. 125 mg solumedrol. 5 albuterol.

## 2014-11-07 ENCOUNTER — Encounter (HOSPITAL_COMMUNITY): Payer: Self-pay | Admitting: Internal Medicine

## 2014-11-07 DIAGNOSIS — Z7982 Long term (current) use of aspirin: Secondary | ICD-10-CM | POA: Diagnosis not present

## 2014-11-07 DIAGNOSIS — R1084 Generalized abdominal pain: Secondary | ICD-10-CM | POA: Diagnosis not present

## 2014-11-07 DIAGNOSIS — I4510 Unspecified right bundle-branch block: Secondary | ICD-10-CM | POA: Diagnosis present

## 2014-11-07 DIAGNOSIS — N182 Chronic kidney disease, stage 2 (mild): Secondary | ICD-10-CM

## 2014-11-07 DIAGNOSIS — Z66 Do not resuscitate: Secondary | ICD-10-CM | POA: Diagnosis present

## 2014-11-07 DIAGNOSIS — Z9049 Acquired absence of other specified parts of digestive tract: Secondary | ICD-10-CM | POA: Diagnosis present

## 2014-11-07 DIAGNOSIS — E038 Other specified hypothyroidism: Secondary | ICD-10-CM | POA: Diagnosis not present

## 2014-11-07 DIAGNOSIS — Z90710 Acquired absence of both cervix and uterus: Secondary | ICD-10-CM | POA: Diagnosis not present

## 2014-11-07 DIAGNOSIS — K210 Gastro-esophageal reflux disease with esophagitis: Secondary | ICD-10-CM

## 2014-11-07 DIAGNOSIS — R197 Diarrhea, unspecified: Secondary | ICD-10-CM | POA: Diagnosis present

## 2014-11-07 DIAGNOSIS — I5032 Chronic diastolic (congestive) heart failure: Secondary | ICD-10-CM | POA: Diagnosis present

## 2014-11-07 DIAGNOSIS — Z87891 Personal history of nicotine dependence: Secondary | ICD-10-CM | POA: Diagnosis not present

## 2014-11-07 DIAGNOSIS — K219 Gastro-esophageal reflux disease without esophagitis: Secondary | ICD-10-CM | POA: Diagnosis present

## 2014-11-07 DIAGNOSIS — I10 Essential (primary) hypertension: Secondary | ICD-10-CM | POA: Diagnosis not present

## 2014-11-07 DIAGNOSIS — E785 Hyperlipidemia, unspecified: Secondary | ICD-10-CM | POA: Diagnosis present

## 2014-11-07 DIAGNOSIS — E039 Hypothyroidism, unspecified: Secondary | ICD-10-CM

## 2014-11-07 DIAGNOSIS — J441 Chronic obstructive pulmonary disease with (acute) exacerbation: Secondary | ICD-10-CM | POA: Insufficient documentation

## 2014-11-07 DIAGNOSIS — N183 Chronic kidney disease, stage 3 (moderate): Secondary | ICD-10-CM | POA: Diagnosis present

## 2014-11-07 DIAGNOSIS — Z79899 Other long term (current) drug therapy: Secondary | ICD-10-CM | POA: Diagnosis not present

## 2014-11-07 DIAGNOSIS — E559 Vitamin D deficiency, unspecified: Secondary | ICD-10-CM | POA: Diagnosis present

## 2014-11-07 DIAGNOSIS — I129 Hypertensive chronic kidney disease with stage 1 through stage 4 chronic kidney disease, or unspecified chronic kidney disease: Secondary | ICD-10-CM | POA: Diagnosis present

## 2014-11-07 LAB — STREP PNEUMONIAE URINARY ANTIGEN: Strep Pneumo Urinary Antigen: NEGATIVE

## 2014-11-07 LAB — GLUCOSE, CAPILLARY
Glucose-Capillary: 179 mg/dL — ABNORMAL HIGH (ref 70–99)
Glucose-Capillary: 183 mg/dL — ABNORMAL HIGH (ref 70–99)

## 2014-11-07 LAB — INFLUENZA PANEL BY PCR (TYPE A & B)
H1N1 flu by pcr: NOT DETECTED
INFLAPCR: NEGATIVE
INFLBPCR: NEGATIVE

## 2014-11-07 LAB — TROPONIN I

## 2014-11-07 MED ORDER — DM-GUAIFENESIN ER 30-600 MG PO TB12
1.0000 | ORAL_TABLET | Freq: Two times a day (BID) | ORAL | Status: DC
  Administered 2014-11-08 – 2014-11-11 (×8): 1 via ORAL
  Filled 2014-11-07 (×10): qty 1

## 2014-11-07 MED ORDER — IPRATROPIUM BROMIDE 0.02 % IN SOLN
0.5000 mg | Freq: Once | RESPIRATORY_TRACT | Status: AC
  Administered 2014-11-07: 0.5 mg via RESPIRATORY_TRACT
  Filled 2014-11-07: qty 2.5

## 2014-11-07 MED ORDER — METHYLPREDNISOLONE SODIUM SUCC 125 MG IJ SOLR
60.0000 mg | Freq: Three times a day (TID) | INTRAMUSCULAR | Status: DC
  Administered 2014-11-07: 60 mg via INTRAVENOUS
  Filled 2014-11-07 (×4): qty 0.96

## 2014-11-07 MED ORDER — METHYLPREDNISOLONE SODIUM SUCC 125 MG IJ SOLR
60.0000 mg | Freq: Two times a day (BID) | INTRAMUSCULAR | Status: DC
  Administered 2014-11-08 – 2014-11-09 (×4): 60 mg via INTRAVENOUS
  Filled 2014-11-07 (×5): qty 0.96

## 2014-11-07 MED ORDER — ENSURE ENLIVE PO LIQD
237.0000 mL | Freq: Two times a day (BID) | ORAL | Status: DC
  Administered 2014-11-07 – 2014-11-11 (×8): 237 mL via ORAL
  Filled 2014-11-07 (×10): qty 237

## 2014-11-07 MED ORDER — ALBUTEROL SULFATE (2.5 MG/3ML) 0.083% IN NEBU
5.0000 mg | INHALATION_SOLUTION | Freq: Once | RESPIRATORY_TRACT | Status: AC
  Administered 2014-11-07: 5 mg via RESPIRATORY_TRACT
  Filled 2014-11-07: qty 6

## 2014-11-07 MED ORDER — ASPIRIN 81 MG PO CHEW
81.0000 mg | CHEWABLE_TABLET | Freq: Every day | ORAL | Status: DC
  Administered 2014-11-07 – 2014-11-11 (×5): 81 mg via ORAL
  Filled 2014-11-07 (×5): qty 1

## 2014-11-07 MED ORDER — AZITHROMYCIN 250 MG PO TABS
250.0000 mg | ORAL_TABLET | Freq: Every day | ORAL | Status: AC
  Administered 2014-11-08 – 2014-11-11 (×4): 250 mg via ORAL
  Filled 2014-11-07 (×4): qty 1

## 2014-11-07 MED ORDER — VITAMIN D 1000 UNITS PO TABS
1000.0000 [IU] | ORAL_TABLET | Freq: Every day | ORAL | Status: DC
  Administered 2014-11-07 – 2014-11-11 (×5): 1000 [IU] via ORAL
  Filled 2014-11-07 (×5): qty 1

## 2014-11-07 MED ORDER — CETYLPYRIDINIUM CHLORIDE 0.05 % MT LIQD
7.0000 mL | Freq: Two times a day (BID) | OROMUCOSAL | Status: DC
  Administered 2014-11-07 – 2014-11-11 (×9): 7 mL via OROMUCOSAL

## 2014-11-07 MED ORDER — FUROSEMIDE 40 MG PO TABS
40.0000 mg | ORAL_TABLET | Freq: Every day | ORAL | Status: DC
  Administered 2014-11-07 – 2014-11-11 (×5): 40 mg via ORAL
  Filled 2014-11-07 (×5): qty 1

## 2014-11-07 MED ORDER — IPRATROPIUM-ALBUTEROL 0.5-2.5 (3) MG/3ML IN SOLN
3.0000 mL | Freq: Four times a day (QID) | RESPIRATORY_TRACT | Status: DC
Start: 2014-11-07 — End: 2014-11-11
  Administered 2014-11-07 – 2014-11-11 (×10): 3 mL via RESPIRATORY_TRACT
  Filled 2014-11-07 (×13): qty 3

## 2014-11-07 MED ORDER — PANTOPRAZOLE SODIUM 40 MG PO TBEC
40.0000 mg | DELAYED_RELEASE_TABLET | Freq: Every day | ORAL | Status: DC
  Administered 2014-11-07 – 2014-11-11 (×5): 40 mg via ORAL
  Filled 2014-11-07 (×4): qty 1

## 2014-11-07 MED ORDER — IPRATROPIUM-ALBUTEROL 0.5-2.5 (3) MG/3ML IN SOLN
3.0000 mL | RESPIRATORY_TRACT | Status: DC
  Administered 2014-11-07: 3 mL via RESPIRATORY_TRACT
  Filled 2014-11-07 (×2): qty 3

## 2014-11-07 MED ORDER — LISINOPRIL 2.5 MG PO TABS
2.5000 mg | ORAL_TABLET | Freq: Every day | ORAL | Status: DC
  Administered 2014-11-07 – 2014-11-11 (×5): 2.5 mg via ORAL
  Filled 2014-11-07 (×5): qty 1

## 2014-11-07 MED ORDER — ALBUTEROL SULFATE (2.5 MG/3ML) 0.083% IN NEBU
2.5000 mg | INHALATION_SOLUTION | RESPIRATORY_TRACT | Status: DC | PRN
  Administered 2014-11-11: 2.5 mg via RESPIRATORY_TRACT
  Filled 2014-11-07: qty 3

## 2014-11-07 MED ORDER — HYDROCODONE-ACETAMINOPHEN 5-325 MG PO TABS
1.0000 | ORAL_TABLET | Freq: Four times a day (QID) | ORAL | Status: DC | PRN
  Administered 2014-11-09 – 2014-11-11 (×4): 1 via ORAL
  Filled 2014-11-07 (×5): qty 1

## 2014-11-07 MED ORDER — ALBUTEROL (5 MG/ML) CONTINUOUS INHALATION SOLN
INHALATION_SOLUTION | RESPIRATORY_TRACT | Status: AC
  Filled 2014-11-07: qty 20

## 2014-11-07 MED ORDER — AZITHROMYCIN 500 MG PO TABS
500.0000 mg | ORAL_TABLET | Freq: Every day | ORAL | Status: AC
  Filled 2014-11-07: qty 1

## 2014-11-07 MED ORDER — PRAVASTATIN SODIUM 40 MG PO TABS
40.0000 mg | ORAL_TABLET | Freq: Every day | ORAL | Status: DC
  Administered 2014-11-08 – 2014-11-10 (×4): 40 mg via ORAL
  Filled 2014-11-07 (×5): qty 1

## 2014-11-07 MED ORDER — ALBUTEROL (5 MG/ML) CONTINUOUS INHALATION SOLN
10.0000 mg | INHALATION_SOLUTION | RESPIRATORY_TRACT | Status: AC
  Administered 2014-11-07: 10 mg via RESPIRATORY_TRACT

## 2014-11-07 MED ORDER — CARVEDILOL 3.125 MG PO TABS
3.1250 mg | ORAL_TABLET | Freq: Two times a day (BID) | ORAL | Status: DC
  Administered 2014-11-07 – 2014-11-11 (×9): 3.125 mg via ORAL
  Filled 2014-11-07 (×12): qty 1

## 2014-11-07 MED ORDER — SODIUM CHLORIDE 0.9 % IV BOLUS (SEPSIS)
1000.0000 mL | Freq: Once | INTRAVENOUS | Status: AC
  Administered 2014-11-07: 1000 mL via INTRAVENOUS

## 2014-11-07 MED ORDER — HEPARIN SODIUM (PORCINE) 5000 UNIT/ML IJ SOLN
5000.0000 [IU] | Freq: Three times a day (TID) | INTRAMUSCULAR | Status: DC
  Administered 2014-11-07 – 2014-11-11 (×11): 5000 [IU] via SUBCUTANEOUS
  Filled 2014-11-07 (×17): qty 1

## 2014-11-07 MED ORDER — LEVOTHYROXINE SODIUM 137 MCG PO TABS
137.0000 ug | ORAL_TABLET | Freq: Every day | ORAL | Status: DC
  Administered 2014-11-08 – 2014-11-11 (×3): 137 ug via ORAL
  Filled 2014-11-07 (×6): qty 1

## 2014-11-07 NOTE — ED Notes (Signed)
Attempted to ambulate patient. Pt O2 sat dropped to 88% with two steps

## 2014-11-07 NOTE — Progress Notes (Signed)
Utilization review completed.  

## 2014-11-07 NOTE — H&P (Signed)
[facility] History and Physical  [name] [place] ZOX:[id number] DOB: 06-Mar-1929 DOA: 11/06/2014  Referring physician: ED physician PCP: Julian Hy, MD  Specialists:   Chief Complaint: cough and SOB  HPI: [name] [name] is a 81 y.o. female with past medical history of hypertension, hyperlipidemia, hypothyroidism, GERD, COPD on prn 2L oxygen , congestive heart failure, chronic kidney disease-stage III, who presents with cough and shortness of breath.  Patient reports that she has worsening shortness of breath in the past several days. She has productive cough with white mucus production. She does not have fever or chills. She also has mild chest pain, which related to coughing. She reports having diarrhea with mild abdominal pain recently. She has 2 or 3 loose stools each day. Patient denies fever, chills, headaches, dysuria, urgency, frequency, hematuria, skin rashes. No unilateral weakness, numbness or tingling sensations. No vision change or hearing loss.  In ED, patient was found to have BNP 75.8, negative troponin, normal temperature, slightly tachycardic, electrolytes okay. Chest x-ray has no infiltration, but shows fullness in the right hilar region, this may be vascular, but cannot exclude right hilar adenopathy or mass per radiologist. The patient is admitted to inpatient for further evaluation treatment.  Review of Systems: As presented in the history of presenting illness, rest negative.  Where does patient live?  SNF Can patient participate in ADLs? none  Allergy: No Known Allergies  Past Medical History  Diagnosis Date  . Hypertension   . Hyperlipidemia   . Thyroid disease   . Hypokalemia   . Vitamin D deficiency   . COPD (chronic obstructive pulmonary disease)   . CHF (congestive heart failure)   . GERD (gastroesophageal reflux disease)   . CKD (chronic kidney disease), stage III     Past Surgical History  Procedure Laterality Date  .  Cholecystectomy    . Abdominal hysterectomy      Social History:  reports that she has quit smoking. Her smoking use included Cigarettes. She has never used smokeless tobacco. She reports that she does not drink alcohol or use illicit drugs.  Family History:  Family History  Problem Relation Age of Onset  . Heart attack Mother   . Heart attack Brother      Prior to Admission medications   Medication Sig Start Date End Date Taking? Authorizing Provider  albuterol (PROVENTIL) (2.5 MG/3ML) 0.083% nebulizer solution Take 2.5 mg by nebulization 4 (four) times daily.    Historical Provider, MD  aspirin 81 MG chewable tablet Chew 1 tablet (81 mg total) by mouth daily. 09/25/14   Leroy Sea, MD  carvedilol (COREG) 3.125 MG tablet Take 1 tablet (3.125 mg total) by mouth 2 (two) times daily with a meal. 09/24/14   Leroy Sea, MD  feeding supplement, ENSURE COMPLETE, (ENSURE COMPLETE) LIQD Take 237 mLs by mouth 2 (two) times daily between meals. 09/24/14   Leroy Sea, MD  furosemide (LASIX) 40 MG tablet Take 1 tablet (40 mg total) by mouth daily. 09/25/14   Leroy Sea, MD  HYDROcodone-acetaminophen (NORCO/VICODIN) 5-325 MG per tablet Take 1 tablet by mouth every 6 (six) hours as needed for moderate pain. 09/25/14   Leroy Sea, MD  ipratropium (ATROVENT) 0.02 % nebulizer solution Take 0.5 mg by nebulization 4 (four) times daily.    Historical Provider, MD  levothyroxine (SYNTHROID, LEVOTHROID) 137 MCG tablet Take 137 mcg by mouth daily before breakfast.    Historical Provider, MD  lisinopril (PRINIVIL,ZESTRIL) 2.5 MG tablet  Take 1 tablet (2.5 mg total) by mouth daily. 05/18/14   Hollice EspySendil K Krishnan, MD  omeprazole (PRILOSEC) 20 MG capsule Take 20 mg by mouth daily.    Historical Provider, MD  polyethylene glycol (MIRALAX / GLYCOLAX) packet Take 17 g by mouth 2 (two) times daily. 09/25/14   Leroy SeaPrashant K Singh, MD  potassium chloride SA (K-DUR,KLOR-CON) 20 MEQ tablet Take 20 mEq by  mouth daily.    Historical Provider, MD  pravastatin (PRAVACHOL) 40 MG tablet Take 40 mg by mouth at bedtime.    Historical Provider, MD  Vitamin D, Cholecalciferol, 1000 UNITS TABS Take 1,000 Units by mouth daily.    Historical Provider, MD    Physical Exam: Filed Vitals:   11/07/14 0345 11/07/14 0400 11/07/14 0415 11/07/14 0430  BP: 132/65 117/56 124/53 108/48  Pulse: 95 96 99 94  Temp:      TempSrc:      Resp: 16 17 18 16   SpO2: 92% 96% 95% 97%   General: Not in acute distress HEENT:       Eyes: PERRL, EOMI, no scleral icterus       ENT: No discharge from the ears and nose, no pharynx injection, no tonsillar enlargement.        Neck: No JVD, no bruit, no mass felt. Cardiac: S1/S2, RRR, No murmurs, No gallops or rubs Pulm: Diffuse expiratory rhonchi bilaterally, no rales. Abd: Soft, nondistended, nontender, no rebound pain, no organomegaly, BS present Ext: Trace leg edema bilaterally. 2+DP/PT pulse bilaterally Musculoskeletal: No joint deformities, erythema, or stiffness, ROM full Skin: No rashes.  Neuro: Alert and oriented X3, cranial nerves II-XII grossly intact, muscle strength 5/5 in all extremeties, sensation to light touch intact. Psych: Patient is not psychotic, no suicidal or hemocidal ideation.  Labs on Admission:  Basic Metabolic Panel:  Recent Labs Lab 11/06/14 2213  NA 139  K 4.1  CL 102  CO2 31  GLUCOSE 117*  BUN 19  CREATININE 1.09  CALCIUM 9.2   Liver Function Tests: No results for input(s): AST, ALT, ALKPHOS, BILITOT, PROT, ALBUMIN in the last 168 hours. No results for input(s): LIPASE, AMYLASE in the last 168 hours. No results for input(s): AMMONIA in the last 168 hours. CBC:  Recent Labs Lab 11/06/14 2213  WBC 7.9  HGB 14.0  HCT 43.9  MCV 92.0  PLT 208   Cardiac Enzymes: No results for input(s): CKTOTAL, CKMB, CKMBINDEX, TROPONINI in the last 168 hours.  BNP (last 3 results)  Recent Labs  09/20/14 1010 11/06/14 2213  BNP 113.7*  75.8    ProBNP (last 3 results)  Recent Labs  01/28/14 1132 05/15/14 2119 05/17/14 1446  PROBNP 785.1* 892.3* 690.8*    CBG: No results for input(s): GLUCAP in the last 168 hours.  Radiological Exams on Admission: Dg Chest 2 View (if Patient Has Fever And/or Copd)  11/06/2014   CLINICAL DATA:  Sudden onset respiratory distress. Low O2 sats. History of CHF, COPD.  EXAM: CHEST  2 VIEW  COMPARISON:  09/20/2014  FINDINGS: Heart is borderline in size. Mediastinal contours are within normal limits. Predominately linear opacities in the right middle lobe, likely atelectasis. Mild fullness in the right hilum. No focal opacity on the left. No effusions.  Mild compression fracture in the lower thoracic spine, likely chronic. Diffuse osteopenia.  IMPRESSION: Right middle lobe atelectasis. Fullness in the right hilar region. This may be vascular, but I cannot exclude right hilar adenopathy or mass.   Electronically Signed   By:  Charlett Nose M.D.   On: 11/06/2014 22:46    EKG: Independently reviewed. Old right bundle blockage  Assessment/Plan Principal Problem:   COPD exacerbation Active Problems:   Essential hypertension, benign   Hypothyroidism   Chronic diastolic heart failure   GERD (gastroesophageal reflux disease)   CKD (chronic kidney disease), stage II   RBBB   HLD (hyperlipidemia)   Diarrhea   CODP exacerbation: Patient's SOB and productive cough most likely caused by COPD exacerbation given diffused rhonchi on auscultation. Chest x-ray has no infiltration. Patient's volume status seems to be okay, less likely to have a CHF exacerbation. -will admit patient to telemetry bed  -Nebulizers: scheduled Duoneb and prn albuterol -Solu-Medrol 60 mg IV q8h  -Oral azithromycin for 5 days.  -Mucinex for cough  -Blood and sputum culture, respiratory viral panel -Urine legionella and S. pneumococcal antigen -Follow up blood culture x2, sputum culture, respiratory virus panel, Flu  pcr  Diastolic congestive heart failure: 2-D echo on 09/23/14 showed Ef 55-60% with grade 1 diastolic dysfunction. CHF is compensated. BNP 75.8. Patient has only trace amount of leg edema. -Continue home does Lasix  -Continue aspirin and Coreg - trop x 3 given mild chest pain  Hypertension: -Continue lisinopril, Coreg, and Lasix  Hypothyroidism: No TSH on record. -continue Synthroid -check TSH  Gerd -Protonix  Hyperlipidemia: No LDL recorded. -Continue pravastatin -Check FLP  Diarrhea: May be related to MiraLAX, but need to rule out other possibilities -Check  c diff pcr and GI pathogen panel -hold MiraLAX  Chronic kidney disease-III: Creatinine 1.09, stable.  -follow-up renal function by BMP  Abnormal chest x-ray: Chest x-ray has no infiltration, but shows fullness in the right hilar region, this may be vascular, but cannot exclude right hilar adenopathy or mass per radiologist. -may get follow up CXRin 4 to 6 week     DVT ppx: SQ Heparin        Code Status: DNR Family Communication: None at bed side.         Disposition Plan: Admit to inpatient   Date of Service 11/07/2014    Lorretta Harp [facility] Pager [phone]  If 7PM-7AM, please contact night-coverage [email or web address] Password [facility] 11/07/2014, 5:37 AM

## 2014-11-07 NOTE — ED Notes (Signed)
PA Humes at bedside.  

## 2014-11-07 NOTE — Progress Notes (Signed)
[facility]                                                                              Patient Demographics  [name] [name], is a 81 y.o. female, DOB - 1929/02/25, ZOX:[id number]  Admit date - 11/06/2014   Admitting Physician Lorretta Harp, MD  Outpatient Primary MD for the patient is Julian Hy, MD  LOS - 0   Chief Complaint  Patient presents with  . Shortness of Breath      HPI on 11/07/2014 by Dr. Lorretta Harp [name] [name] is a 80 y.o. female with past medical history of hypertension, hyperlipidemia, hypothyroidism, GERD, COPD on prn 2L oxygen , congestive heart failure, chronic kidney disease-stage III, who presents with cough and shortness of breath. Patient reports that she has worsening shortness of breath in the past several days. She has productive cough with white mucus production. She does not have fever or chills. She also has mild chest pain, which related to coughing. She reports having diarrhea with mild abdominal pain recently. She has 2 or 3 loose stools each day. Patient denies fever, chills, headaches, dysuria, urgency, frequency, hematuria, skin rashes. No unilateral weakness, numbness or tingling sensations. No vision change or hearing loss. In ED, patient was found to have BNP 75.8, negative troponin, normal temperature, slightly tachycardic, electrolytes okay. Chest x-ray has no infiltration, but shows fullness in the right hilar region, this may be vascular, but cannot exclude right hilar adenopathy or mass per radiologist. The patient is admitted to inpatient for further evaluation treatment.   Assessment & Plan   Dyspnea/cough secondary to COPD exacerbation -Chest x-ray: Negative for infiltrate -Improving slightly -Continue nebulizer treatments, Solu-Medrol, azithromycin, antitussives -Blood and sputum cultures pending, respiratory viral panel pending -Influenza PCR negative, strep pneumoniae urine antigen negative  Chronic Diastolic  congestive heart failure -Echocardiogram 09/23/2014 shows an A. fib 55-60%, grade 1 diastolic dysfunction -BNP 75.8 -Appears to currently be euvolemic -Continue daily weights, intake and output monitoring -Troponin negative  -Continue home medications, aspirin chloride Lasix  Hypertension -Stable, continue lisinopril, Coreg, Lasix  Hypothyroidism -TSH pending -Continue Synthroid  GERD -Continue PPI  Hyperlipidemia -Lipid panel: Pending -Continue statin  Diarrhea -Likely secondary to marijuana -C. difficile PCR/GI pathogen panel currently pending  Chronic kidney disease, stage III -Currently stable, continue to monitor BMP  Abnormal chest x-ray -Shows no infiltration however fullness in the right hilar region, may be vascular cannot exclude right hilar adenopathy or mass -Patient should have repeat chest x-ray in 4-6 weeks  Code Status: DNR  Family Communication: None at bedside  Disposition Plan: Admitted, continue treatment for COPD exac  Time Spent in minutes   30 minutes  Procedures  None  Consults   None  DVT Prophylaxis  heparin  Lab Results  Component Value Date   PLT 208 11/06/2014    Medications  Scheduled Meds: . antiseptic oral rinse  7 mL Mouth Rinse BID  . aspirin  81 mg Oral Daily  . azithromycin  500 mg Oral Daily   Followed by  . [START ON 11/08/2014] azithromycin  250 mg Oral Daily  . carvedilol  3.125 mg Oral BID WC  . cholecalciferol  1,000 Units Oral Daily  . dextromethorphan-guaiFENesin  1 tablet Oral BID  . feeding supplement (ENSURE ENLIVE)  237 mL Oral BID BM  . furosemide  40 mg Oral Daily  . heparin  5,000 Units Subcutaneous 3 times per day  . ipratropium-albuterol  3 mL Nebulization QID  . levothyroxine  137 mcg Oral QAC breakfast  . lisinopril  2.5 mg Oral Daily  . methylPREDNISolone (SOLU-MEDROL) injection  60 mg Intravenous 3 times per day  . pantoprazole  40 mg Oral Daily  . pravastatin  40 mg Oral QHS    Continuous Infusions:  PRN Meds:.albuterol, HYDROcodone-acetaminophen  Antibiotics    Anti-infectives    Start     Dose/Rate Route Frequency Ordered Stop   11/08/14 1000  azithromycin (ZITHROMAX) tablet 250 mg     250 mg Oral Daily 11/07/14 0613 11/12/14 0959   11/07/14 0630  azithromycin (ZITHROMAX) tablet 500 mg     500 mg Oral Daily 11/07/14 [id number] 11/08/14 0959        Subjective:   [name] [name] seen and examined today.  Patient continues to complain of shortness of breath however feels slightly improved. Denies any chest pain at this time. Continues to have cough. Denies abdominal pain.  Objective:   Filed Vitals:   11/07/14 0515 11/07/14 0609 11/07/14 0943 11/07/14 1345  BP: 120/66 118/59    Pulse: 96 97    Temp:  98.3 F (36.8 C)    TempSrc:  Oral    Resp: 22 28    SpO2: 96% 94% 96% 96%    Wt Readings from Last 3 Encounters:  09/25/14 55.898 kg (123 lb 3.7 oz)  05/19/14 61 kg (134 lb 7.7 oz)  12/23/12 55.792 kg (123 lb)     Intake/Output Summary (Last 24 hours) at 11/07/14 1346 Last data filed at 11/07/14 0841  Gross per 24 hour  Intake      0 ml  Output     50 ml  Net    -50 ml    Exam  General: Well developed, well nourished, NAD, appears stated age  HEENT: NCAT, mucous membranes moist.   Cardiovascular: S1 S2 auscultated, no rubs, murmurs or gallops. Regular rate and rhythm.  Respiratory: Diminished breath sounds with diffuse expiratory wheezing   Abdomen: Soft, nontender, nondistended, + bowel sounds  Extremities: warm dry without cyanosis clubbing. Trace LE edema.  Neuro: AAOx3, nonfocal   Psych: Normal affect and demeanor with intact judgement and insight  Data Review   Micro Results Recent Results (from the past 240 hour(s))  Culture, blood (routine x 2) Call MD if unable to obtain prior to antibiotics being given     Status: None (Preliminary result)   Collection Time: 11/07/14  7:50 AM  Result Value Ref Range Status    Specimen Description BLOOD RIGHT ANTECUBITAL  Final   Special Requests BOTTLES DRAWN AEROBIC ONLY 10CC  Final   Culture PENDING  Incomplete   Report Status PENDING  Incomplete    Radiology Reports Dg Chest 2 View (if Patient Has Fever And/or Copd)  11/06/2014   CLINICAL DATA:  Sudden onset respiratory distress. Low O2 sats. History of CHF, COPD.  EXAM: CHEST  2 VIEW  COMPARISON:  09/20/2014  FINDINGS: Heart is borderline in size. Mediastinal contours are within normal limits. Predominately linear opacities in the right middle lobe, likely atelectasis. Mild fullness in the right hilum. No focal opacity on the left. No effusions.  Mild compression fracture in the lower thoracic  spine, likely chronic. Diffuse osteopenia.  IMPRESSION: Right middle lobe atelectasis. Fullness in the right hilar region. This may be vascular, but I cannot exclude right hilar adenopathy or mass.   Electronically Signed   By: Charlett Nose M.D.   On: 11/06/2014 22:46    CBC  Recent Labs Lab 11/06/14 2213  WBC 7.9  HGB 14.0  HCT 43.9  PLT 208  MCV 92.0  MCH 29.4  MCHC 31.9  RDW 14.5    Chemistries   Recent Labs Lab 11/06/14 2213  NA 139  K 4.1  CL 102  CO2 31  GLUCOSE 117*  BUN 19  CREATININE 1.09  CALCIUM 9.2   ------------------------------------------------------------------------------------------------------------------ CrCl cannot be calculated (Unknown ideal weight.). ------------------------------------------------------------------------------------------------------------------ No results for input(s): HGBA1C in the last 72 hours. ------------------------------------------------------------------------------------------------------------------ No results for input(s): CHOL, HDL, LDLCALC, TRIG, CHOLHDL, LDLDIRECT in the last 72 hours. ------------------------------------------------------------------------------------------------------------------ No results for input(s): TSH, T4TOTAL,  T3FREE, THYROIDAB in the last 72 hours.  Invalid input(s): FREET3 ------------------------------------------------------------------------------------------------------------------ No results for input(s): VITAMINB12, FOLATE, FERRITIN, TIBC, IRON, RETICCTPCT in the last 72 hours.  Coagulation profile No results for input(s): INR, PROTIME in the last 168 hours.  No results for input(s): DDIMER in the last 72 hours.  Cardiac Enzymes  Recent Labs Lab 11/07/14 0750  TROPONINI <0.03   ------------------------------------------------------------------------------------------------------------------ Invalid input(s): POCBNP    Romyn Boswell D.O. on 11/07/2014 at 1:46 PM  Between 7am to 7pm - Pager - [phone]  After 7pm go to [email or web address] - password [email or web address]  And look for the night coverage person covering for me after hours  [facility] Office  [phone]

## 2014-11-07 NOTE — ED Provider Notes (Signed)
Chief Complaint  Patient presents with  . Shortness of Breath    This chart was scribed for [name] AlbeIva Sharnice Bosler, MD by Andrew Auaven Small, ED Scribe. This patient was seen in room A10C/A10C and the patient's care was started at 12:10 AM.  HPI Comments:  [name] [name] is a 81 y.o. female who present to the Emergency Department complaining of SOB . Pt reports improvement with breathing but stills has some SOB after getting nebulizer treatments. She reports associated productive cough with white mucous and some chest tightness,.  Physical Exam: Pt just finished nebulizer treatment and still has some SOB. She is alert and cooperative Lung exam- diminished  breath sounds and increaed work of breathing . No edema in her extremities   Medical screening examination/treatment/procedure(s) were conducted as a shared visit with non-physician practitioner(s) and myself.  I personally evaluated the patient during the encounter.   EKG Interpretation   Date/Time:  Saturday November 06 2014 21:59:31 EDT Ventricular Rate:  87 PR Interval:  169 QRS Duration: 154 QT Interval:  405 QTC Calculation: 487 R Axis:   70 Text Interpretation:  Age not entered, assumed to be  81 years old for  purpose of ECG interpretation Sinus rhythm Right bundle branch block  Confirmed by Adriana SimasOOK  MD, BRIAN ([id number]) on 11/06/2014 10:31:23 PM       [name] AlbeIva [name], MD, Concha PyoFACEP   [name] Geisler, MD 11/07/14 [phone]

## 2014-11-08 LAB — GLUCOSE, CAPILLARY
GLUCOSE-CAPILLARY: 215 mg/dL — AB (ref 70–99)
Glucose-Capillary: 147 mg/dL — ABNORMAL HIGH (ref 70–99)
Glucose-Capillary: 176 mg/dL — ABNORMAL HIGH (ref 70–99)
Glucose-Capillary: 122 mg/dL — ABNORMAL HIGH (ref 70–99)

## 2014-11-08 LAB — CBC
HEMATOCRIT: 43.5 % (ref 36.0–46.0)
HEMOGLOBIN: 13.9 g/dL (ref 12.0–15.0)
MCH: 29.8 pg (ref 26.0–34.0)
MCHC: 32 g/dL (ref 30.0–36.0)
MCV: 93.1 fL (ref 78.0–100.0)
Platelets: 230 10*3/uL (ref 150–400)
RBC: 4.67 MIL/uL (ref 3.87–5.11)
RDW: 14.6 % (ref 11.5–15.5)
WBC: 12.2 10*3/uL — AB (ref 4.0–10.5)

## 2014-11-08 LAB — LEGIONELLA ANTIGEN, URINE

## 2014-11-08 MED ORDER — VANCOMYCIN HCL 500 MG IV SOLR
500.0000 mg | INTRAVENOUS | Status: DC
  Filled 2014-11-08: qty 500

## 2014-11-08 MED ORDER — VANCOMYCIN HCL IN DEXTROSE 750-5 MG/150ML-% IV SOLN
750.0000 mg | Freq: Once | INTRAVENOUS | Status: AC
  Administered 2014-11-08: 750 mg via INTRAVENOUS
  Filled 2014-11-08: qty 150

## 2014-11-08 NOTE — Progress Notes (Signed)
ANTIBIOTIC CONSULT NOTE - INITIAL  Pharmacy Consult for vancomycin Indication: bacteremia  No Known Allergies  Patient Measurements: Weight: 128 lb 4.9 oz (58.2 kg)   Vital Signs: Temp: 97.3 F (36.3 C) (03/28 0539) Temp Source: Axillary (03/28 0539) BP: 151/72 mmHg (03/28 1034) Pulse Rate: 91 (03/28 0539) Intake/Output from previous day: 03/27 0701 - 03/28 0700 In: 360 [P.O.:360] Out: 50 [Urine:50] Intake/Output from this shift: Total I/O In: -  Out: 100 [Urine:100]  Labs:  Recent Labs  11/06/14 2213  WBC 7.9  HGB 14.0  PLT 208  CREATININE 1.09   Estimated Creatinine Clearance: 33.8 mL/min (by C-G formula based on Cr of 1.09). No results for input(s): VANCOTROUGH, VANCOPEAK, VANCORANDOM, GENTTROUGH, GENTPEAK, GENTRANDOM, TOBRATROUGH, TOBRAPEAK, TOBRARND, AMIKACINPEAK, AMIKACINTROU, AMIKACIN in the last 72 hours.   Microbiology: Recent Results (from the past 720 hour(s))  Culture, blood (routine x 2) Call MD if unable to obtain prior to antibiotics being given     Status: None (Preliminary result)   Collection Time: 11/07/14  7:50 AM  Result Value Ref Range Status   Specimen Description BLOOD RIGHT ANTECUBITAL  Final   Special Requests BOTTLES DRAWN AEROBIC ONLY 10CC  Final   Culture   Final    GRAM POSITIVE COCCI IN CLUSTERS Note: Gram Stain Report Called to,Read Back By and Verified With: Anthoney HaradaADRIAN SLOAN BY INGRAM A 11/08/14 1020AM Performed at [facility]    Report Status PENDING  Incomplete  Culture, blood (routine x 2) Call MD if unable to obtain prior to antibiotics being given     Status: None (Preliminary result)   Collection Time: 11/07/14  7:53 AM  Result Value Ref Range Status   Specimen Description BLOOD LEFT WRIST  Final   Special Requests BOTTLES DRAWN AEROBIC ONLY  10CC  Final   Culture   Final           BLOOD CULTURE RECEIVED NO GROWTH TO DATE CULTURE WILL BE HELD FOR 5 DAYS BEFORE ISSUING A FINAL NEGATIVE REPORT Performed at [facility]    Report Status PENDING  Incomplete    Medical History: Past Medical History  Diagnosis Date  . Hypertension   . Hyperlipidemia   . Thyroid disease   . Hypokalemia   . Vitamin D deficiency   . COPD (chronic obstructive pulmonary disease)   . CHF (congestive heart failure)   . GERD (gastroesophageal reflux disease)   . CKD (chronic kidney disease), stage III   Assessment: 81 year old female admitted with copd exacerbation, placed on empiric azithromycin now found to have 1/2 positive blood cultures for GPC. Will start empiric vancomycin, recheck bld cxs and d/c vanc if contaminant. No fevers, wbc normal, scr normal. Will adjust dosing d/t age.  Goal of Therapy:  Vancomycin trough level 15-20 mcg/ml  Plan:  Measure antibiotic drug levels at steady state Follow up culture results Vancomycin 750mg  IV now then 500mg  IV q24 hours  Sheppard CoilFrank Roshawna Colclasure PharmD., BCPS Clinical Pharmacist Pager [phone] 11/08/2014 11:44 AM

## 2014-11-08 NOTE — Progress Notes (Signed)
Nutrition Brief Note  Patient identified on the Malnutrition Screening Tool (MST) Report  Wt Readings from Last 15 Encounters:  11/08/14 128 lb 4.9 oz (58.2 kg)  09/25/14 123 lb 3.7 oz (55.898 kg)  05/19/14 134 lb 7.7 oz (61 kg)  12/23/12 123 lb (55.792 kg)  11/05/12 120 lb (54.432 kg)  03/11/12 131 lb 2.8 oz (59.5 kg)    Body mass index is 22.01 kg/(m^2). Patient meets criteria for normal based on current BMI. Weight has been stable.  Current diet order is heart, patient is consuming approximately 75-100% of meals at this time. Pt reports having a good appetite now and PTA with no other difficulties. Pt currently has Ensure ordered and would like to continue with them. Labs and medications reviewed.   No further nutrition interventions warranted at this time. If nutrition issues arise, please consult RD.   Marijean NiemannStephanie La, MS, RD, LDN Pager # [phone] After hours/ weekend pager # [phone]

## 2014-11-08 NOTE — Progress Notes (Signed)
Report received, care assumed. afleming RN 

## 2014-11-08 NOTE — Clinical Social Work Psychosocial (Signed)
Clinical Social Work Department BRIEF PSYCHOSOCIAL ASSESSMENT 11/08/2014  Patient:  [name] [name],[name] [name]     Account Number:  [id number]     Admit date:  11/06/2014  Clinical Social Worker:  Merlyn LotHOLOMAN,Kadiatou Oplinger, CLINICAL SOCIAL WORKER  Date/Time:  11/08/2014 10:00 AM  Referred by:  Physician  Date Referred:  11/08/2014 Referred for  SNF Placement   Other Referral:   Interview type:  Patient Other interview type:    PSYCHOSOCIAL DATA Living Status:  ALONE [facility] Level of care:  Skilled Nursing Facility Primary support name:  [name] [name] Primary support relationship to patient:  FAMILY Degree of support available:   unclear level of support    CURRENT CONCERNS Current Concerns  Post-Acute Placement   Other Concerns:    SOCIAL WORK ASSESSMENT / PLAN CSW spoke with patient concerning return to [name] at time of DC- pt reports that she has been at rehab for 2 days and pt reports that she is agreeable to returning to [name] for rehab.    Pt seemed somewhat confused when CSW asked her about her living situation prior to going to [name]- she states that she has only be at rehab for 2 days but then states that she lives at [facility]- CSW unable to clarify with pt during the interview.    CSW will continue to follow   Assessment/plan status:  Psychosocial Support/Ongoing Assessment of Needs Other assessment/ plan:   FL2 update   Information/referral to community resources:    PATIENT'S/FAMILY'S RESPONSE TO PLAN OF CARE: Patient is agreeable to returning to [name] at time of DC but expresses that she does not feel ready to leave at this time       Merlyn LotJenna Holoman, Bjosc LLCCSWA Clinical Social Worker [phone]

## 2014-11-08 NOTE — Evaluation (Signed)
Physical Therapy Evaluation Patient Details Name: [name] [name] MRN: [id number] DOB: Apr 18, 1929 Today's Date: 11/08/2014   History of Present Illness  81 y.o. female with past medical history of hypertension, hyperlipidemia, hypothyroidism, GERD, COPD on prn 2L oxygen , congestive heart failure, chronic kidney disease-stage III, who presents with cough and shortness of breath.  Recent admission with d/c to SNF.  Clinical Impression  Patient demonstrates deficits in functional mobility as indicated below. Will need continued skilled PT to address deficits and maximize function. Will see as indicated and progress as tolerated.     Follow Up Recommendations SNF;Supervision/Assistance - 24 hour    Equipment Recommendations  None recommended by PT    Recommendations for Other Services       Precautions / Restrictions Precautions Precautions: Fall      Mobility  Bed Mobility Overal bed mobility: Needs Assistance Bed Mobility: Supine to Sit;Sit to Supine     Supine to sit: Min assist Sit to supine: Min assist   General bed mobility comments: Min assist to rotate trunk to EOB, assist to elevate LEs to return to bed, assist for repositioning in bed.   Transfers Overall transfer level: Needs assistance Equipment used: Rolling walker (2 wheeled) Transfers: Sit to/from Stand Sit to Stand: Min assist         General transfer comment: VCs for safe hand placement, assist for stability and controlled descent to bed  Ambulation/Gait Ambulation/Gait assistance: Min guard Ambulation Distance (Feet): 16 Feet (8 fwd 8 back) Assistive device: Rolling walker (2 wheeled) Gait Pattern/deviations: Step-to pattern;Decreased stride length;Narrow base of support Gait velocity: decreased and guarded Gait velocity interpretation: Below normal speed for age/gender General Gait Details: patient very cautious with ambulation, reliance on RW, VCs for upright posture.   Stairs            Wheelchair Mobility    Modified Rankin (Stroke Patients Only)       Balance Overall balance assessment: History of Falls                                           Pertinent Vitals/Pain Pain Assessment: Faces Faces Pain Scale: Hurts little more Pain Location: arm Pain Descriptors / Indicators: Sore Pain Intervention(s): Limited activity within patient's tolerance;Monitored during session;Repositioned;Relaxation    Home Living Family/patient expects to be discharged to:: Skilled nursing facility   Available Help at Discharge: Skilled Nursing Facility           Home Equipment: Dan Humphreys - 2 wheels Additional Comments: Pt from [name]     Prior Function Level of Independence: Needs assistance         Comments: Pt states she uses a RW and has assist for ADLs      Hand Dominance   Dominant Hand: Right    Extremity/Trunk Assessment   Upper Extremity Assessment: Generalized weakness           Lower Extremity Assessment: Generalized weakness      Cervical / Trunk Assessment: Kyphotic  Communication   Communication: HOH  Cognition Arousal/Alertness: Awake/alert Behavior During Therapy: WFL for tasks assessed/performed Overall Cognitive Status: No family/caregiver present to determine baseline cognitive functioning                      General Comments      Exercises        Assessment/Plan  PT Assessment Patient needs continued PT services  PT Diagnosis Difficulty walking;Generalized weakness   PT Problem List Decreased strength;Decreased range of motion;Decreased activity tolerance;Decreased balance;Decreased mobility;Decreased safety awareness;Cardiopulmonary status limiting activity;Pain  PT Treatment Interventions DME instruction;Gait training;Functional mobility training;Therapeutic activities;Therapeutic exercise;Balance training;Patient/family education   PT Goals (Current goals can be found in the Care  Plan section) Acute Rehab PT Goals Patient Stated Goal: to get better PT Goal Formulation: With patient Time For Goal Achievement: 11/22/14 Potential to Achieve Goals: Good    Frequency Min 3X/week   Barriers to discharge        Co-evaluation               End of Session Equipment Utilized During Treatment: Gait belt;Oxygen Activity Tolerance: Patient limited by fatigue Patient left: in bed;with call bell/phone within reach;with bed alarm set Nurse Communication: Mobility status         Time: [id number] PT Time Calculation (min) (ACUTE ONLY): 17 min   Charges:   PT Evaluation $Initial PT Evaluation Tier I: 1 Procedure     PT G CodesFabio [name]:        [name] [name] 11/08/2014, 3:33 PM  [name] [name], PT [name]  [phone]

## 2014-11-08 NOTE — Progress Notes (Signed)
[facility]                                                                              Patient Demographics  [name] [name], is a 81 y.o. female, DOB - 05-10-1929, UVO:[id number]  Admit date - 11/06/2014   Admitting Physician Lorretta Harp, MD  Outpatient Primary MD for the patient is Julian Hy, MD  LOS - 1   Chief Complaint  Patient presents with  . Shortness of Breath      HPI on 11/07/2014 by Dr. Lorretta Harp [name] [name] is a 81 y.o. female with past medical history of hypertension, hyperlipidemia, hypothyroidism, GERD, COPD on prn 2L oxygen , congestive heart failure, chronic kidney disease-stage III, who presents with cough and shortness of breath. Patient reports that she has worsening shortness of breath in the past several days. She has productive cough with white mucus production. She does not have fever or chills. She also has mild chest pain, which related to coughing. She reports having diarrhea with mild abdominal pain recently. She has 2 or 3 loose stools each day. Patient denies fever, chills, headaches, dysuria, urgency, frequency, hematuria, skin rashes. No unilateral weakness, numbness or tingling sensations. No vision change or hearing loss. In ED, patient was found to have BNP 75.8, negative troponin, normal temperature, slightly tachycardic, electrolytes okay. Chest x-ray has no infiltration, but shows fullness in the right hilar region, this may be vascular, but cannot exclude right hilar adenopathy or mass per radiologist. The patient is admitted to inpatient for further evaluation treatment.   Assessment & Plan   Dyspnea/cough secondary to COPD exacerbation -Chest x-ray: Negative for infiltrate -Improving slightly -Continue nebulizer treatments, Solu-Medrol, antibioticx, antitussives -Blood culture 1/2 GPC -respiratory viral panel pending -Influenza PCR negative, strep pneumoniae urine antigen negative  Bacteremia? -Blood cultures  1/2 GPC in clusters- suspect contamination -Will obtain second set and place patient on vancomycin -No leukocytosis or fever  Chronic Diastolic congestive heart failure -Echocardiogram 09/23/2014 shows an A. fib 55-60%, grade 1 diastolic dysfunction -BNP 75.8 -Appears to currently be euvolemic -Continue daily weights, intake and output monitoring -Troponin negative  -Continue home medications, aspirin chloride Lasix  Hypertension -Stable, continue lisinopril, Coreg, Lasix  Hypothyroidism -Continue Synthroid  GERD -Continue PPI  Hyperlipidemia -Continue statin  Diarrhea -Likely secondary to marijuana -C. difficile PCR/GI pathogen panel currently pending  Chronic kidney disease, stage III -Currently stable, continue to monitor BMP  Abnormal chest x-ray -Shows no infiltration however fullness in the right hilar region, may be vascular cannot exclude right hilar adenopathy or mass -Patient should have repeat chest x-ray in 4-6 weeks  Code Status: DNR  Family Communication: None at bedside  Disposition Plan: Admitted, continue treatment for COPD exac and reculture  Time Spent in minutes   30 minutes  Procedures  None  Consults   None  DVT Prophylaxis  heparin  Lab Results  Component Value Date   PLT 208 11/06/2014    Medications  Scheduled Meds: . antiseptic oral rinse  7 mL Mouth Rinse BID  . aspirin  81 mg Oral Daily  . azithromycin  250 mg Oral Daily  . carvedilol  3.125 mg Oral BID WC  .  cholecalciferol  1,000 Units Oral Daily  . dextromethorphan-guaiFENesin  1 tablet Oral BID  . feeding supplement (ENSURE ENLIVE)  237 mL Oral BID BM  . furosemide  40 mg Oral Daily  . heparin  5,000 Units Subcutaneous 3 times per day  . ipratropium-albuterol  3 mL Nebulization QID  . levothyroxine  137 mcg Oral QAC breakfast  . lisinopril  2.5 mg Oral Daily  . methylPREDNISolone (SOLU-MEDROL) injection  60 mg Intravenous Q12H  . pantoprazole  40 mg Oral Daily  .  pravastatin  40 mg Oral QHS   Continuous Infusions:  PRN Meds:.albuterol, HYDROcodone-acetaminophen  Antibiotics    Anti-infectives    Start     Dose/Rate Route Frequency Ordered Stop   11/08/14 1000  azithromycin (ZITHROMAX) tablet 250 mg     250 mg Oral Daily 11/07/14 0613 11/12/14 0959   11/07/14 0630  azithromycin (ZITHROMAX) tablet 500 mg     500 mg Oral Daily 11/07/14 [id number] 11/08/14 0959        Subjective:   [name] [name] seen and examined today.  Patient continues to complain of shortness of breath however feels slightly improved. Continues to have cough and loose stools.  Denies abdominal pain, nausea, vomiting.   Objective:   Filed Vitals:   11/07/14 1914 11/07/14 1958 11/08/14 0539 11/08/14 1034  BP:  140/71 155/69 151/72  Pulse:  100 91   Temp:  98 F (36.7 C) 97.3 F (36.3 C)   TempSrc:  Oral Axillary   Resp:  18 22   Weight:   58.2 kg (128 lb 4.9 oz)   SpO2: 96% 95% 92%     Wt Readings from Last 3 Encounters:  11/08/14 58.2 kg (128 lb 4.9 oz)  09/25/14 55.898 kg (123 lb 3.7 oz)  05/19/14 61 kg (134 lb 7.7 oz)     Intake/Output Summary (Last 24 hours) at 11/08/14 1129 Last data filed at 11/08/14 1042  Gross per 24 hour  Intake    240 ml  Output    100 ml  Net    140 ml    Exam  General: Well developed,  NAD  Cardiovascular: S1 S2 auscultated, RRR  Respiratory: Diminished breath sounds, +wheezing  Abdomen: Soft, nontender, nondistended, + bowel sounds  Extremities: warm dry without cyanosis clubbing. Trace LE edema.  Neuro: AAOx3, nonfocal   Psych: Normal affect and demeanor   Data Review   Micro Results Recent Results (from the past 240 hour(s))  Culture, blood (routine x 2) Call MD if unable to obtain prior to antibiotics being given     Status: None (Preliminary result)   Collection Time: 11/07/14  7:50 AM  Result Value Ref Range Status   Specimen Description BLOOD RIGHT ANTECUBITAL  Final   Special Requests BOTTLES  DRAWN AEROBIC ONLY 10CC  Final   Culture   Final    GRAM POSITIVE COCCI IN CLUSTERS Note: Gram Stain Report Called to,Read Back By and Verified With: Anthoney HaradaADRIAN SLOAN BY INGRAM A 11/08/14 1020AM [facility]formed [facility]    Report Status PENDING  Incomplete  Culture, blood (routine x 2) Call MD if unable to obtain prior to antibiotics being given     Status: None (Preliminary result)   Collection Time: 11/07/14  7:53 AM  Result Value Ref Range Status   Specimen Description BLOOD LEFT WRIST  Final   Special Requests BOTTLES DRAWN AEROBIC ONLY  10CC  Final   Culture   Final  BLOOD CULTURE RECEIVED NO GROWTH TO DATE CULTURE WILL BE HELD FOR 5 DAYS BEFORE ISSUING A FINAL NEGATIVE REPORT Performed at [facility]    Report Status PENDING  Incomplete    Radiology Reports Dg Chest 2 View (if Patient Has Fever And/or Copd)  11/06/2014   CLINICAL DATA:  Sudden onset respiratory distress. Low O2 sats. History of CHF, COPD.  EXAM: CHEST  2 VIEW  COMPARISON:  09/20/2014  FINDINGS: Heart is borderline in size. Mediastinal contours are within normal limits. Predominately linear opacities in the right middle lobe, likely atelectasis. Mild fullness in the right hilum. No focal opacity on the left. No effusions.  Mild compression fracture in the lower thoracic spine, likely chronic. Diffuse osteopenia.  IMPRESSION: Right middle lobe atelectasis. Fullness in the right hilar region. This may be vascular, but I cannot exclude right hilar adenopathy or mass.   Electronically Signed   By: Charlett Nose M.D.   On: 11/06/2014 22:46    CBC  Recent Labs Lab 11/06/14 2213  WBC 7.9  HGB 14.0  HCT 43.9  PLT 208  MCV 92.0  MCH 29.4  MCHC 31.9  RDW 14.5    Chemistries   Recent Labs Lab 11/06/14 2213  NA 139  K 4.1  CL 102  CO2 31  GLUCOSE 117*  BUN 19  CREATININE 1.09  CALCIUM 9.2    ------------------------------------------------------------------------------------------------------------------ estimated creatinine clearance is 33.8 mL/min (by C-G formula based on Cr of 1.09). ------------------------------------------------------------------------------------------------------------------ No results for input(s): HGBA1C in the last 72 hours. ------------------------------------------------------------------------------------------------------------------ No results for input(s): CHOL, HDL, LDLCALC, TRIG, CHOLHDL, LDLDIRECT in the last 72 hours. ------------------------------------------------------------------------------------------------------------------ No results for input(s): TSH, T4TOTAL, T3FREE, THYROIDAB in the last 72 hours.  Invalid input(s): FREET3 ------------------------------------------------------------------------------------------------------------------ No results for input(s): VITAMINB12, FOLATE, FERRITIN, TIBC, IRON, RETICCTPCT in the last 72 hours.  Coagulation profile No results for input(s): INR, PROTIME in the last 168 hours.  No results for input(s): DDIMER in the last 72 hours.  Cardiac Enzymes  Recent Labs Lab 11/07/14 0750 11/07/14 1826  TROPONINI <0.03 <0.03   ------------------------------------------------------------------------------------------------------------------ Invalid input(s): POCBNP    Kodie Kishi D.O. on 11/08/2014 at 11:29 AM  Between 7am to 7pm - Pager - [phone]  After 7pm go to [email or web address] - password [email or web address]  And look for the night coverage person covering for me after hours  [facility] Office  [phone]

## 2014-11-09 LAB — CULTURE, BLOOD (ROUTINE X 2)

## 2014-11-09 LAB — CBC
HCT: 40 % (ref 36.0–46.0)
Hemoglobin: 12.8 g/dL (ref 12.0–15.0)
MCH: 30 pg (ref 26.0–34.0)
MCHC: 32 g/dL (ref 30.0–36.0)
MCV: 93.7 fL (ref 78.0–100.0)
PLATELETS: 214 10*3/uL (ref 150–400)
RBC: 4.27 MIL/uL (ref 3.87–5.11)
RDW: 14.7 % (ref 11.5–15.5)
WBC: 10.6 10*3/uL — AB (ref 4.0–10.5)

## 2014-11-09 LAB — BASIC METABOLIC PANEL
Anion gap: 7 (ref 5–15)
BUN: 32 mg/dL — ABNORMAL HIGH (ref 6–23)
CALCIUM: 8.9 mg/dL (ref 8.4–10.5)
CO2: 30 mmol/L (ref 19–32)
Chloride: 101 mmol/L (ref 96–112)
Creatinine, Ser: 0.89 mg/dL (ref 0.50–1.10)
GFR, EST AFRICAN AMERICAN: 68 mL/min — AB (ref >90)
GFR, EST NON AFRICAN AMERICAN: 58 mL/min — AB (ref >90)
Glucose, Bld: 129 mg/dL — ABNORMAL HIGH (ref 70–99)
Potassium: 4.2 mmol/L (ref 3.5–5.1)
Sodium: 138 mmol/L (ref 135–145)

## 2014-11-09 LAB — RESPIRATORY VIRUS PANEL
Adenovirus: NEGATIVE
INFLUENZA A: NEGATIVE
Influenza B: NEGATIVE
Metapneumovirus: NEGATIVE
PARAINFLUENZA 1 A: NEGATIVE
Parainfluenza 2: NEGATIVE
Parainfluenza 3: NEGATIVE
RHINOVIRUS: NEGATIVE
Respiratory Syncytial Virus A: NEGATIVE
Respiratory Syncytial Virus B: NEGATIVE

## 2014-11-09 MED ORDER — METHYLPREDNISOLONE SODIUM SUCC 125 MG IJ SOLR
60.0000 mg | Freq: Every day | INTRAMUSCULAR | Status: DC
  Administered 2014-11-10: 60 mg via INTRAVENOUS
  Filled 2014-11-09: qty 0.96

## 2014-11-09 NOTE — Progress Notes (Addendum)
[facility]                                                                              Patient Demographics  [name] [name], is a 81 y.o. female, DOB - 05-29-1929, ZOX:[id number]  Admit date - 11/06/2014   Admitting Physician Lorretta Harp, MD  Outpatient Primary MD for the patient is Julian Hy, MD  LOS - 2   Chief Complaint  Patient presents with  . Shortness of Breath      HPI on 11/07/2014 by Dr. Lorretta Harp [name] [name] is a 81 y.o. female with past medical history of hypertension, hyperlipidemia, hypothyroidism, GERD, COPD on prn 2L oxygen , congestive heart failure, chronic kidney disease-stage III, who presents with cough and shortness of breath. Patient reports that she has worsening shortness of breath in the past several days. She has productive cough with white mucus production. She does not have fever or chills. She also has mild chest pain, which related to coughing. She reports having diarrhea with mild abdominal pain recently. She has 2 or 3 loose stools each day. Patient denies fever, chills, headaches, dysuria, urgency, frequency, hematuria, skin rashes. No unilateral weakness, numbness or tingling sensations. No vision change or hearing loss. In ED, patient was found to have BNP 75.8, negative troponin, normal temperature, slightly tachycardic, electrolytes okay. Chest x-ray has no infiltration, but shows fullness in the right hilar region, this may be vascular, but cannot exclude right hilar adenopathy or mass per radiologist. The patient is admitted to inpatient for further evaluation treatment.   Assessment & Plan   Dyspnea/cough secondary to COPD exacerbation -Chest x-ray: Negative for infiltrate -Improving slightly -Continue nebulizer treatments, Solu-Medrol, antibioticx, antitussives -Continue to taper steroids -Blood culture 1/2 GPC- SCN -respiratory viral panel pending -Influenza PCR negative, strep pneumoniae and legionella urine  antigens negative  Bacteremia? -Blood cultures 1/2 GPC in clusters/ SCN- suspect contamination -Initially placed on vancomycin, will discontinue -Repeat cultures show no growth to date -No leukocytosis or fever  Chronic Diastolic congestive heart failure -Echocardiogram 09/23/2014 shows an A. fib 55-60%, grade 1 diastolic dysfunction -BNP 75.8 -Appears to currently be euvolemic -Continue daily weights, intake and output monitoring -Troponin negative  -Continue home medications, aspirin chloride Lasix  Hypertension -Stable, continue lisinopril, Coreg, Lasix  Hypothyroidism -Continue Synthroid  GERD -Continue PPI  Hyperlipidemia -Continue statin  Diarrhea -Likely secondary to miralax -C. difficile PCR/GI pathogen panel currently pending  Chronic kidney disease, stage III -Currently stable, continue to monitor BMP  Abnormal chest x-ray -Shows no infiltration however fullness in the right hilar region, may be vascular cannot exclude right hilar adenopathy or mass -Patient should have repeat chest x-ray in 4-6 weeks  Generalized weakness -PT consulted and recommended continue SNF  Code Status: DNR  Family Communication: None at bedside  Disposition Plan: Admitted, patient states she is feeling weak and does not want to be discharged early.  Continue COPD exac treatment.  Likely dc 11/10/2014 back to SNF  Time Spent in minutes   30 minutes  Procedures  None  Consults   None  DVT Prophylaxis  heparin  Lab Results  Component Value Date   PLT 214 11/09/2014  Medications  Scheduled Meds: . antiseptic oral rinse  7 mL Mouth Rinse BID  . aspirin  81 mg Oral Daily  . azithromycin  250 mg Oral Daily  . carvedilol  3.125 mg Oral BID WC  . cholecalciferol  1,000 Units Oral Daily  . dextromethorphan-guaiFENesin  1 tablet Oral BID  . feeding supplement (ENSURE ENLIVE)  237 mL Oral BID BM  . furosemide  40 mg Oral Daily  . heparin  5,000 Units Subcutaneous 3  times per day  . ipratropium-albuterol  3 mL Nebulization QID  . levothyroxine  137 mcg Oral QAC breakfast  . lisinopril  2.5 mg Oral Daily  . methylPREDNISolone (SOLU-MEDROL) injection  60 mg Intravenous Q12H  . pantoprazole  40 mg Oral Daily  . pravastatin  40 mg Oral QHS   Continuous Infusions:  PRN Meds:.albuterol, HYDROcodone-acetaminophen  Antibiotics    Anti-infectives    Start     Dose/Rate Route Frequency Ordered Stop   11/09/14 1800  vancomycin (VANCOCIN) 500 mg in sodium chloride 0.9 % 100 mL IVPB  Status:  Discontinued     500 mg 100 mL/hr over 60 Minutes Intravenous Every 24 hours 11/08/14 1139 11/09/14 1142   11/08/14 1300  vancomycin (VANCOCIN) IVPB 750 mg/150 ml premix     750 mg 150 mL/hr over 60 Minutes Intravenous  Once 11/08/14 1139 11/08/14 1430   11/08/14 1000  azithromycin (ZITHROMAX) tablet 250 mg     250 mg Oral Daily 11/07/14 0613 11/12/14 0959   11/07/14 0630  azithromycin (ZITHROMAX) tablet 500 mg     500 mg Oral Daily 11/07/14 1610 11/08/14 0959        Subjective:   [name] [name] seen and examined today.  Patient continues to complain of shortness of breath and is upset as she did not get toast this morning with her breakfast.  She continues to complain feeling weak.  Denies any diarrhea in the past day. Denies abdominal pain, nausea, vomiting.   Objective:   Filed Vitals:   11/08/14 1945 11/08/14 2105 11/09/14 0421 11/09/14 1131  BP:  127/57 152/64 185/91  Pulse: 80 92 70 83  Temp:  98.3 F (36.8 C) 98.1 F (36.7 C)   TempSrc:  Oral Oral   Resp: Weight:   59 kg (130 lb 1.1 oz)   SpO2:  91% 95%     Wt Readings from Last 3 Encounters:  11/09/14 59 kg (130 lb 1.1 oz)  09/25/14 55.898 kg (123 lb 3.7 oz)  05/19/14 61 kg (134 lb 7.7 oz)     Intake/Output Summary (Last 24 hours) at 11/09/14 1317 Last data filed at 11/09/14 1100  Gross per 24 hour  Intake    480 ml  Output     50 ml  Net    430 ml     Exam  General: Well developed,  NAD  Cardiovascular: S1 S2 auscultated, RRR  Respiratory: Diminished breath sounds/ decreased air movement, scattered exp wheezing  Extremities: warm dry without cyanosis clubbing, or edema  Neuro: AAOx3, nonfocal   Data Review   Micro Results Recent Results (from the past 240 hour(s))  Culture, blood (routine x 2) Call MD if unable to obtain prior to antibiotics being given     Status: None   Collection Time: 11/07/14  7:50 AM  Result Value Ref Range Status   Specimen Description BLOOD RIGHT ANTECUBITAL  Final   Special Requests BOTTLES DRAWN AEROBIC ONLY 10CC  Final  Culture   Final    STAPHYLOCOCCUS SPECIES (COAGULASE NEGATIVE) Note: THE SIGNIFICANCE OF ISOLATING THIS ORGANISM FROM A SINGLE SET OF BLOOD CULTURES WHEN MULTIPLE SETS ARE DRAWN IS UNCERTAIN. PLEASE NOTIFY THE MICROBIOLOGY DEPARTMENT WITHIN ONE WEEK IF SPECIATION AND SENSITIVITIES ARE REQUIRED. Note: Gram Stain Report Called to,Read Back By and Verified With: Anthoney Harada BY INGRAM A 11/08/14 1020AM Performed at [facility]    Report Status 11/09/2014 FINAL  Final  Culture, blood (routine x 2) Call MD if unable to obtain prior to antibiotics being given     Status: None (Preliminary result)   Collection Time: 11/07/14  7:53 AM  Result Value Ref Range Status   Specimen Description BLOOD LEFT WRIST  Final   Special Requests BOTTLES DRAWN AEROBIC ONLY  10CC  Final   Culture   Final           BLOOD CULTURE RECEIVED NO GROWTH TO DATE CULTURE WILL BE HELD FOR 5 DAYS BEFORE ISSUING A FINAL NEGATIVE REPORT Performed at [facility]    Report Status PENDING  Incomplete  Culture, blood (routine x 2)     Status: None (Preliminary result)   Collection Time: 11/08/14 12:10 PM  Result Value Ref Range Status   Specimen Description BLOOD RIGHT ANTECUBITAL  Final   Special Requests BOTTLES DRAWN AEROBIC ONLY 7CC  Final   Culture   Final           BLOOD CULTURE RECEIVED  NO GROWTH TO DATE CULTURE WILL BE HELD FOR 5 DAYS BEFORE ISSUING A FINAL NEGATIVE REPORT Performed at [facility]    Report Status PENDING  Incomplete  Culture, blood (routine x 2)     Status: None (Preliminary result)   Collection Time: 11/08/14 12:15 PM  Result Value Ref Range Status   Specimen Description BLOOD RIGHT WRIST  Final   Special Requests BOTTLES DRAWN AEROBIC ONLY 2CC  Final   Culture   Final           BLOOD CULTURE RECEIVED NO GROWTH TO DATE CULTURE WILL BE HELD FOR 5 DAYS BEFORE ISSUING A FINAL NEGATIVE REPORT Performed at [facility]    Report Status PENDING  Incomplete    Radiology Reports Dg Chest 2 View (if Patient Has Fever And/or Copd)  11/06/2014   CLINICAL DATA:  Sudden onset respiratory distress. Low O2 sats. History of CHF, COPD.  EXAM: CHEST  2 VIEW  COMPARISON:  09/20/2014  FINDINGS: Heart is borderline in size. Mediastinal contours are within normal limits. Predominately linear opacities in the right middle lobe, likely atelectasis. Mild fullness in the right hilum. No focal opacity on the left. No effusions.  Mild compression fracture in the lower thoracic spine, likely chronic. Diffuse osteopenia.  IMPRESSION: Right middle lobe atelectasis. Fullness in the right hilar region. This may be vascular, but I cannot exclude right hilar adenopathy or mass.   Electronically Signed   By: Charlett Nose M.D.   On: 11/06/2014 22:46    CBC  Recent Labs Lab 11/06/14 2213 11/08/14 1210 11/09/14 0500  WBC 7.9 12.2* 10.6*  HGB 14.0 13.9 12.8  HCT 43.9 43.5 40.0  PLT 208 230 214  MCV 92.0 93.1 93.7  MCH 29.4 29.8 30.0  MCHC 31.9 32.0 32.0  RDW 14.5 14.6 14.7    Chemistries   Recent Labs Lab 11/06/14 2213 11/09/14 0500  NA 139 138  K 4.1 4.2  CL 102 101  CO2 31 30  GLUCOSE 117*  129*  BUN 19 32*  CREATININE 1.09 0.89  CALCIUM 9.2 8.9    ------------------------------------------------------------------------------------------------------------------ estimated creatinine clearance is 41.4 mL/min (by C-G formula based on Cr of 0.89). ------------------------------------------------------------------------------------------------------------------ No results for input(s): HGBA1C in the last 72 hours. ------------------------------------------------------------------------------------------------------------------ No results for input(s): CHOL, HDL, LDLCALC, TRIG, CHOLHDL, LDLDIRECT in the last 72 hours. ------------------------------------------------------------------------------------------------------------------ No results for input(s): TSH, T4TOTAL, T3FREE, THYROIDAB in the last 72 hours.  Invalid input(s): FREET3 ------------------------------------------------------------------------------------------------------------------ No results for input(s): VITAMINB12, FOLATE, FERRITIN, TIBC, IRON, RETICCTPCT in the last 72 hours.  Coagulation profile No results for input(s): INR, PROTIME in the last 168 hours.  No results for input(s): DDIMER in the last 72 hours.  Cardiac Enzymes  Recent Labs Lab 11/07/14 0750 11/07/14 1826  TROPONINI <0.03 <0.03   ------------------------------------------------------------------------------------------------------------------ Invalid input(s): POCBNP    Chonte Ricke D.O. on 11/09/2014 at 1:17 PM  Between 7am to 7pm - Pager - [phone]  After 7pm go to [email or web address] - password [email or web address]  And look for the night coverage person covering for me after hours  [facility] Office  [phone]

## 2014-11-09 NOTE — Progress Notes (Signed)
11/09/2014 1215 [facility] AdvocateEunice RN.

## 2014-11-10 ENCOUNTER — Inpatient Hospital Stay (HOSPITAL_COMMUNITY): Payer: Medicare Other

## 2014-11-10 DIAGNOSIS — R1084 Generalized abdominal pain: Secondary | ICD-10-CM

## 2014-11-10 LAB — BASIC METABOLIC PANEL
Anion gap: 7 (ref 5–15)
BUN: 34 mg/dL — ABNORMAL HIGH (ref 6–23)
CO2: 33 mmol/L — ABNORMAL HIGH (ref 19–32)
CREATININE: 0.74 mg/dL (ref 0.50–1.10)
Calcium: 8.9 mg/dL (ref 8.4–10.5)
Chloride: 99 mmol/L (ref 96–112)
GFR, EST AFRICAN AMERICAN: 89 mL/min — AB (ref >90)
GFR, EST NON AFRICAN AMERICAN: 77 mL/min — AB (ref >90)
GLUCOSE: 122 mg/dL — AB (ref 70–99)
POTASSIUM: 4 mmol/L (ref 3.5–5.1)
SODIUM: 139 mmol/L (ref 135–145)

## 2014-11-10 LAB — URINALYSIS, ROUTINE W REFLEX MICROSCOPIC
Bilirubin Urine: NEGATIVE
Glucose, UA: NEGATIVE mg/dL
Hgb urine dipstick: NEGATIVE
Ketones, ur: NEGATIVE mg/dL
LEUKOCYTES UA: NEGATIVE
Nitrite: NEGATIVE
Protein, ur: NEGATIVE mg/dL
SPECIFIC GRAVITY, URINE: 1.006 (ref 1.005–1.030)
UROBILINOGEN UA: 0.2 mg/dL (ref 0.0–1.0)
pH: 6 (ref 5.0–8.0)

## 2014-11-10 LAB — TSH: TSH: 2.923 u[IU]/mL (ref 0.350–4.500)

## 2014-11-10 LAB — CBC
HEMATOCRIT: 41.4 % (ref 36.0–46.0)
HEMOGLOBIN: 13 g/dL (ref 12.0–15.0)
MCH: 29.1 pg (ref 26.0–34.0)
MCHC: 31.4 g/dL (ref 30.0–36.0)
MCV: 92.6 fL (ref 78.0–100.0)
Platelets: 213 10*3/uL (ref 150–400)
RBC: 4.47 MIL/uL (ref 3.87–5.11)
RDW: 14.5 % (ref 11.5–15.5)
WBC: 7 10*3/uL (ref 4.0–10.5)

## 2014-11-10 MED ORDER — PREDNISONE 50 MG PO TABS
50.0000 mg | ORAL_TABLET | Freq: Every day | ORAL | Status: DC
  Administered 2014-11-11: 50 mg via ORAL
  Filled 2014-11-10 (×2): qty 1

## 2014-11-10 MED ORDER — SENNOSIDES-DOCUSATE SODIUM 8.6-50 MG PO TABS
2.0000 | ORAL_TABLET | Freq: Two times a day (BID) | ORAL | Status: DC
  Administered 2014-11-10 – 2014-11-11 (×3): 2 via ORAL
  Filled 2014-11-10 (×4): qty 2

## 2014-11-10 NOTE — Progress Notes (Signed)
SATURATION QUALIFICATIONS: (This note is used to comply with regulatory documentation for home oxygen)  Patient Saturations on Room Air at Rest = 88%   Patient Saturations on Room Air while Ambulating = 83%  Patient Saturations on 2 Liters of oxygen while Ambulating= 91%  Please briefly explain why patient needs home oxygen: Has COPD Hiram Combererek Markeesha Char RN

## 2014-11-10 NOTE — Progress Notes (Signed)
[facility]                                                                              Patient Demographics  [name] [name], is a 81 y.o. female, DOB - 07/12/1929, ZOX:[id number]  Admit date - 11/06/2014   Admitting Physician Lorretta HarpXilin Niu, MD  Outpatient Primary MD for the patient is Julian HySOUTH,STEPHEN ALAN, MD  LOS - 3   Chief Complaint  Patient presents with  . Shortness of Breath      HPI on 11/07/2014 by Dr. Lorretta HarpXilin Niu [name] Alexanders[place]lshefski is a 81 y.o. female with past medical history of hypertension, hyperlipidemia, hypothyroidism, GERD, COPD on prn 2L oxygen , congestive heart failure, chronic kidney disease-stage III, who presents with cough and shortness of breath. Patient reports that she has worsening shortness of breath in the past several days. She has productive cough with white mucus production. She does not have fever or chills. She also has mild chest pain, which related to coughing. She reports having diarrhea with mild abdominal pain recently. She has 2 or 3 loose stools each day. Patient denies fever, chills, headaches, dysuria, urgency, frequency, hematuria, skin rashes. No unilateral weakness, numbness or tingling sensations. No vision change or hearing loss. In ED, patient was found to have BNP 75.8, negative troponin, normal temperature, slightly tachycardic, electrolytes okay. Chest x-ray has no infiltration, but shows fullness in the right hilar region, this may be vascular, but cannot exclude right hilar adenopathy or mass per radiologist. The patient is admitted to inpatient for further evaluation treatment.   Assessment & Plan   Dyspnea/cough secondary to COPD exacerbation -Chest x-ray: Negative for infiltrate -Improving slightly -Continue nebulizer treatments, Solu-Medrol, antibioticx, antitussives -Continue to taper steroids -Blood culture 1/2 GPC- SCN -respiratory viral panel pending -Influenza PCR negative, strep pneumoniae and legionella urine  antigens negative -taper steroid/ambulate patient  Bacteremia? -Blood cultures 1/2 GPC in clusters/ SCN- suspect contamination -Initially placed on vancomycin, will discontinue -Repeat cultures show no growth to date -No leukocytosis or fever  Chronic Diastolic congestive heart failure -Echocardiogram 09/23/2014 shows an A. fib 55-60%, grade 1 diastolic dysfunction -BNP 75.8 -Appears to currently be euvolemic -Continue daily weights, intake and output monitoring -Troponin negative  -Continue home medications, aspirin chloride Lasix  Hypertension -Stable, continue lisinopril, Coreg, Lasix  Hypothyroidism -Continue Synthroid  GERD -Continue PPI  Hyperlipidemia -Continue statin  Diarrhea -Likely secondary to miralax -C. difficile PCR/GI pathogen panel currently pending -no bm in the last 48hrs, c/o generalized ab pain, no n/v, no leukocytosis, kub pending on 3/30  Chronic kidney disease, stage III -Currently stable, continue to monitor BMP  Abnormal chest x-ray -Shows no infiltration however fullness in the right hilar region, may be vascular cannot exclude right hilar adenopathy or mass -Patient should have repeat chest x-ray in 4-6 weeks  Generalized weakness -PT consulted and recommended continue SNF  Code Status: DNR  Family Communication: None at bedside  Disposition Plan: Admitted, patient states she is feeling weak /continued cough/ab pain /does not want to be discharged.  Continue COPD exac treatment.  Likely dc 11/11/2014 back to SNF  Time Spent in minutes   30 minutes  Procedures  None  Consults  None  DVT Prophylaxis  heparin  Lab Results  Component Value Date   PLT 213 11/10/2014    Medications  Scheduled Meds: . antiseptic oral rinse  7 mL Mouth Rinse BID  . aspirin  81 mg Oral Daily  . azithromycin  250 mg Oral Daily  . carvedilol  3.125 mg Oral BID WC  . cholecalciferol  1,000 Units Oral Daily  . dextromethorphan-guaiFENesin  1  tablet Oral BID  . feeding supplement (ENSURE ENLIVE)  237 mL Oral BID BM  . furosemide  40 mg Oral Daily  . heparin  5,000 Units Subcutaneous 3 times per day  . ipratropium-albuterol  3 mL Nebulization QID  . levothyroxine  137 mcg Oral QAC breakfast  . lisinopril  2.5 mg Oral Daily  . pantoprazole  40 mg Oral Daily  . pravastatin  40 mg Oral QHS  . [START ON 11/11/2014] predniSONE  50 mg Oral Q breakfast  . senna-docusate  2 tablet Oral BID   Continuous Infusions:  PRN Meds:.albuterol, HYDROcodone-acetaminophen  Antibiotics    Anti-infectives    Start     Dose/Rate Route Frequency Ordered Stop   11/09/14 1800  vancomycin (VANCOCIN) 500 mg in sodium chloride 0.9 % 100 mL IVPB  Status:  Discontinued     500 mg 100 mL/hr over 60 Minutes Intravenous Every 24 hours 11/08/14 1139 11/09/14 1142   11/08/14 1300  vancomycin (VANCOCIN) IVPB 750 mg/150 ml premix     750 mg 150 mL/hr over 60 Minutes Intravenous  Once 11/08/14 1139 11/08/14 1430   11/08/14 1000  azithromycin (ZITHROMAX) tablet 250 mg     250 mg Oral Daily 11/07/14 0613 11/12/14 0959   11/07/14 0630  azithromycin (ZITHROMAX) tablet 500 mg     500 mg Oral Daily 11/07/14 1610 11/08/14 0959        Subjective:   [name] [employer] seen and examined today.  Patient is sitting in chair, frail, no cough noticed during encounter, but she reported cough is "horrible", also c/o ab pain,  No n/v. Reported no bm in the last 48hrs.   Objective:   Filed Vitals:   11/09/14 1500 11/09/14 1934 11/10/14 0649 11/10/14 1322  BP: 111/57 168/64 163/63 163/70  Pulse: 71 100 69 80  Temp: 97.9 F (36.6 C) 98.5 F (36.9 C) 97.9 F (36.6 C) 97.6 F (36.4 C)  TempSrc: Oral Oral Oral Oral  Resp: Weight:      SpO2: 97% 94% 97% 94%    Wt Readings from Last 3 Encounters:  11/09/14 59 kg (130 lb 1.1 oz)  09/25/14 55.898 kg (123 lb 3.7 oz)  05/19/14 61 kg (134 lb 7.7 oz)     Intake/Output Summary (Last 24 hours) at  11/10/14 1326 Last data filed at 11/10/14 1322  Gross per 24 hour  Intake    480 ml  Output   1000 ml  Net   -520 ml    Exam  General: frail,  NAD  Cardiovascular: S1 S2 auscultated, RRR  Respiratory: Diminished breath sounds/ decreased air movement, no obvious wheezing  Extremities: warm dry without cyanosis clubbing, or edema  Neuro: AAOx3, nonfocal   Data Review   Micro Results Recent Results (from the past 240 hour(s))  Culture, blood (routine x 2) Call MD if unable to obtain prior to antibiotics being given     Status: None   Collection Time: 11/07/14  7:50 AM  Result Value Ref Range Status   Specimen  Description BLOOD RIGHT ANTECUBITAL  Final   Special Requests BOTTLES DRAWN AEROBIC ONLY 10CC  Final   Culture   Final    STAPHYLOCOCCUS SPECIES (COAGULASE NEGATIVE) Note: THE SIGNIFICANCE OF ISOLATING THIS ORGANISM FROM A SINGLE SET OF BLOOD CULTURES WHEN MULTIPLE SETS ARE DRAWN IS UNCERTAIN. PLEASE NOTIFY THE MICROBIOLOGY DEPARTMENT WITHIN ONE WEEK IF SPECIATION AND SENSITIVITIES ARE REQUIRED. Note: Gram Stain Report Called to,Read Back By and Verified With: Anthoney Harada BY INGRAM A 11/08/14 1020AM Performed at [facility]    Report Status 11/09/2014 FINAL  Final  Culture, blood (routine x 2) Call MD if unable to obtain prior to antibiotics being given     Status: None (Preliminary result)   Collection Time: 11/07/14  7:53 AM  Result Value Ref Range Status   Specimen Description BLOOD LEFT WRIST  Final   Special Requests BOTTLES DRAWN AEROBIC ONLY  10CC  Final   Culture   Final           BLOOD CULTURE RECEIVED NO GROWTH TO DATE CULTURE WILL BE HELD FOR 5 DAYS BEFORE ISSUING A FINAL NEGATIVE REPORT Performed at [facility]    Report Status PENDING  Incomplete  Respiratory virus panel     Status: None   Collection Time: 11/07/14  8:39 AM  Result Value Ref Range Status   Source - RVPAN NASAL SWAB  Corrected   Respiratory Syncytial Virus A Negative  Negative Final   Respiratory Syncytial Virus B Negative Negative Final   Influenza A Negative Negative Final   Influenza B Negative Negative Final   Parainfluenza 1 Negative Negative Final   Parainfluenza 2 Negative Negative Final   Parainfluenza 3 Negative Negative Final   Metapneumovirus Negative Negative Final   Rhinovirus Negative Negative Final   Adenovirus Negative Negative Final    Comment: (NOTE) Performed At: [facility] [address] [place], [place] [id number] Mila Homer MD WU:[id number]   Culture, blood (routine x 2)     Status: None (Preliminary result)   Collection Time: 11/08/14 12:10 PM  Result Value Ref Range Status   Specimen Description BLOOD RIGHT ANTECUBITAL  Final   Special Requests BOTTLES DRAWN AEROBIC ONLY 7CC  Final   Culture   Final           BLOOD CULTURE RECEIVED NO GROWTH TO DATE CULTURE WILL BE HELD FOR 5 DAYS BEFORE ISSUING A FINAL NEGATIVE REPORT Performed at [facility]    Report Status PENDING  Incomplete  Culture, blood (routine x 2)     Status: None (Preliminary result)   Collection Time: 11/08/14 12:15 PM  Result Value Ref Range Status   Specimen Description BLOOD RIGHT WRIST  Final   Special Requests BOTTLES DRAWN AEROBIC ONLY 2CC  Final   Culture   Final           BLOOD CULTURE RECEIVED NO GROWTH TO DATE CULTURE WILL BE HELD FOR 5 DAYS BEFORE ISSUING A FINAL NEGATIVE REPORT Performed at [facility]    Report Status PENDING  Incomplete    Radiology Reports Dg Chest 2 View (if Patient Has Fever And/or Copd)  11/06/2014   CLINICAL DATA:  Sudden onset respiratory distress. Low O2 sats. History of CHF, COPD.  EXAM: CHEST  2 VIEW  COMPARISON:  09/20/2014  FINDINGS: Heart is borderline in size. Mediastinal contours are within normal limits. Predominately linear opacities in the right middle lobe, likely atelectasis. Mild fullness in the right hilum. No focal opacity  on the left. No effusions.  Mild compression  fracture in the lower thoracic spine, likely chronic. Diffuse osteopenia.  IMPRESSION: Right middle lobe atelectasis. Fullness in the right hilar region. This may be vascular, but I cannot exclude right hilar adenopathy or mass.   Electronically Signed   By: Charlett Nose M.D.   On: 11/06/2014 22:46   Dg Abd Portable 1v  11/10/2014   CLINICAL DATA:  Abdominal pain since this morning. Also we shortness of breath.  EXAM: PORTABLE ABDOMEN - 1 VIEW  COMPARISON:  None.  FINDINGS: No bowel dilation is seen to suggest obstruction or generalized adynamic ileus. Mild increased stool in the colon.  No evidence of renal or ureteral stones. Soft tissues are unremarkable.  Bony structures are demineralized. There are degenerative changes of the visualized spine and arthropathic changes of both hips.  IMPRESSION: 1. No acute findings. No evidence of bowel obstruction or generalized adynamic ileus. No evidence of renal or ureteral stones. 2. Mild increased stool in the colon.   Electronically Signed   By: Amie Portland M.D.   On: 11/10/2014 11:26    CBC  Recent Labs Lab 11/06/14 2213 11/08/14 1210 11/09/14 0500 11/10/14 0406  WBC 7.9 12.2* 10.6* 7.0  HGB 14.0 13.9 12.8 13.0  HCT 43.9 43.5 40.0 41.4  PLT 208 230 214 213  MCV 92.0 93.1 93.7 92.6  MCH 29.4 29.8 30.0 29.1  MCHC 31.9 32.0 32.0 31.4  RDW 14.5 14.6 14.7 14.5    Chemistries   Recent Labs Lab 11/06/14 2213 11/09/14 0500 11/10/14 0406  NA 139 138 139  K 4.1 4.2 4.0  CL 102 101 99  CO2 31 30 33*  GLUCOSE 117* 129* 122*  BUN 19 32* 34*  CREATININE 1.09 0.89 0.74  CALCIUM 9.2 8.9 8.9   ------------------------------------------------------------------------------------------------------------------ estimated creatinine clearance is 46 mL/min (by C-G formula based on Cr of 0.74). ------------------------------------------------------------------------------------------------------------------ No results for input(s): HGBA1C in the last  72 hours. ------------------------------------------------------------------------------------------------------------------ No results for input(s): CHOL, HDL, LDLCALC, TRIG, CHOLHDL, LDLDIRECT in the last 72 hours. ------------------------------------------------------------------------------------------------------------------  Recent Labs  11/10/14 0939  TSH 2.923   ------------------------------------------------------------------------------------------------------------------ No results for input(s): VITAMINB12, FOLATE, FERRITIN, TIBC, IRON, RETICCTPCT in the last 72 hours.  Coagulation profile No results for input(s): INR, PROTIME in the last 168 hours.  No results for input(s): DDIMER in the last 72 hours.  Cardiac Enzymes  Recent Labs Lab 11/07/14 0750 11/07/14 1826  TROPONINI <0.03 <0.03   ------------------------------------------------------------------------------------------------------------------ Invalid input(s): Jodean Lima MD, PhD on 11/10/2014 at 1:26 PM  Between 7am to 7pm - Pager - [phone]  After 7pm go to [email or web address] - password [email or web address]  And look for the night coverage person covering for me after hours  [facility] Office  [phone]

## 2014-11-10 NOTE — Progress Notes (Signed)
Utilization review completed.  

## 2014-11-10 NOTE — Progress Notes (Signed)
Medicare Important Message given? YES  (If response is "NO", the following Medicare IM given date fields will be blank)  Date Medicare IM given: 11/10/14 Medicare IM given by:  Rhone Ozaki  

## 2014-11-10 NOTE — Progress Notes (Signed)
Physical Therapy Treatment Patient Details Name: [name] [name] MRN: [id number] DOB: April 29, 1929 Today's Date: 11/10/2014    History of Present Illness 81 y.o. female with past medical history of hypertension, hyperlipidemia, hypothyroidism, GERD, COPD on prn 2L oxygen , congestive heart failure, chronic kidney disease-stage III, who presents with cough and shortness of breath.  Recent admission with d/c to SNF.    PT Comments    Pt progressing very slowly due to fatigue and generalized weakness. Worked on standing exercises as well as transfers because pt having difficulty getting up. PT will continue to follow.   Follow Up Recommendations  SNF;Supervision/Assistance - 24 hour     Equipment Recommendations  None recommended by PT    Recommendations for Other Services       Precautions / Restrictions Precautions Precautions: Fall Restrictions Weight Bearing Restrictions: No    Mobility  Bed Mobility Overal bed mobility: Modified Independent Bed Mobility: Supine to Sit     Supine to sit: Modified independent (Device/Increase time)     General bed mobility comments: despite fatigue, pt able to get self to EOB with increased time and use of rail, without physical assist  Transfers Overall transfer level: Needs assistance Equipment used: Rolling walker (2 wheeled) Transfers: Sit to/from Stand Sit to Stand: Min assist         General transfer comment: pt prefers to put hands on RW, discussed safety issues with this and practiced getting up multiple times with hands on bed. Pt insecure moving hands from stable surface to RW, will continue to work on this  Ambulation/Gait Ambulation/Gait assistance: Architect (Feet): 6 Feet (3' fwd, 3'back) Assistive device: Rolling walker (2 wheeled) Gait Pattern/deviations: Step-to pattern;Decreased stride length;Trunk flexed Gait velocity: very slow Gait velocity interpretation: Below normal speed for  age/gender General Gait Details: vc's for upright posture   Stairs            Wheelchair Mobility    Modified Rankin (Stroke Patients Only)       Balance Overall balance assessment: History of Falls;Needs assistance Sitting-balance support: Single extremity supported;Feet supported Sitting balance-Leahy Scale: Fair Sitting balance - Comments: pt maintained sitting EOB x8 mins and remained for lunch. Able to use bilateral UE's in sitting though increases fatigue   Standing balance support: During functional activity;Bilateral upper extremity supported Standing balance-Leahy Scale: Poor Standing balance comment: pt able to let go of RW briefly with one UE with min A to steady                    Cognition Arousal/Alertness: Awake/alert Behavior During Therapy: WFL for tasks assessed/performed Overall Cognitive Status: No family/caregiver present to determine baseline cognitive functioning       Memory: Decreased short-term memory              Exercises Other Exercises Other Exercises: exercise sequence: sit to stand, 5 marches, reach low with one hand, reach high with other, 5 marches, return to sitting. Pt was able to complete 2x with 2 min seated rest break in between.    General Comments General comments (skin integrity, edema, etc.): O2 sats 95% on 2L O2, HR 72 bpm      Pertinent Vitals/Pain Pain Assessment: No/denies pain    Home Living                      Prior Function            PT Goals (current goals  can now be found in the care plan section) Acute Rehab PT Goals Patient Stated Goal: to get better PT Goal Formulation: With patient Time For Goal Achievement: 11/22/14 Potential to Achieve Goals: Good Progress towards PT goals: Progressing toward goals    Frequency  Min 3X/week    PT Plan Current plan remains appropriate    Co-evaluation             End of Session Equipment Utilized During Treatment: Gait  belt;Oxygen Activity Tolerance: Patient limited by fatigue Patient left: in bed;with call bell/phone within reach     Time: 1201-1219 PT Time Calculation (min) (ACUTE ONLY): 18 min  Charges:  $Therapeutic Activity: 8-22 mins                    G Codes:     Lyanne CoVictoria Sharmon Cheramie, PT  Acute Rehab Services  [phone]  [place], [place] 11/10/2014, 1:15 PM

## 2014-11-11 DIAGNOSIS — E038 Other specified hypothyroidism: Secondary | ICD-10-CM

## 2014-11-11 LAB — BASIC METABOLIC PANEL
ANION GAP: 9 (ref 5–15)
BUN: 30 mg/dL — ABNORMAL HIGH (ref 6–23)
CALCIUM: 8.7 mg/dL (ref 8.4–10.5)
CO2: 32 mmol/L (ref 19–32)
CREATININE: 0.78 mg/dL (ref 0.50–1.10)
Chloride: 98 mmol/L (ref 96–112)
GFR calc Af Amer: 87 mL/min — ABNORMAL LOW (ref >90)
GFR, EST NON AFRICAN AMERICAN: 75 mL/min — AB (ref >90)
GLUCOSE: 126 mg/dL — AB (ref 70–99)
Potassium: 3.8 mmol/L (ref 3.5–5.1)
Sodium: 139 mmol/L (ref 135–145)

## 2014-11-11 LAB — HEMOGLOBIN A1C
Hgb A1c MFr Bld: 6.3 % — ABNORMAL HIGH (ref 4.8–5.6)
Mean Plasma Glucose: 134 mg/dL

## 2014-11-11 LAB — CBC
HCT: 40.2 % (ref 36.0–46.0)
HEMOGLOBIN: 12.7 g/dL (ref 12.0–15.0)
MCH: 29.1 pg (ref 26.0–34.0)
MCHC: 31.6 g/dL (ref 30.0–36.0)
MCV: 92 fL (ref 78.0–100.0)
Platelets: 199 10*3/uL (ref 150–400)
RBC: 4.37 MIL/uL (ref 3.87–5.11)
RDW: 14.4 % (ref 11.5–15.5)
WBC: 6.5 10*3/uL (ref 4.0–10.5)

## 2014-11-11 LAB — MAGNESIUM: Magnesium: 2.1 mg/dL (ref 1.5–2.5)

## 2014-11-11 MED ORDER — HYDROCODONE-ACETAMINOPHEN 5-325 MG PO TABS: 1.0000 | ORAL_TABLET | Freq: Four times a day (QID) | ORAL | Status: DC | PRN

## 2014-11-11 MED ORDER — GUAIFENESIN ER 600 MG PO TB12: 600.0000 mg | ORAL_TABLET | Freq: Two times a day (BID) | ORAL | Status: DC

## 2014-11-11 NOTE — Care Management Note (Signed)
    Page 1 of 1   11/11/2014     12:31:15 PM CARE MANAGEMENT NOTE 11/11/2014  Patient:  [name] [name],[name] [name]   Account Number:  [id number]  Date Initiated:  11/10/2014  Documentation initiated by:  Donn PieriniWEBSTER,Glendora Clouatre  Subjective/Objective Assessment:   Pt admitted with SOB     Action/Plan:   [facility] CSW following for return to SNF when medically stable   Anticipated DC Date:  11/11/2014   Anticipated DC Plan:  SKILLED NURSING FACILITY  In-house referral  Clinical Social Worker      DC Planning Services  CM consult      Choice offered to / List presented to:             Status of service:  Completed, signed off Medicare Important Message given?  YES (If response is "NO", the following Medicare IM given date fields will be blank) Date Medicare IM given:  11/10/2014 Medicare IM given by:  Donn PieriniWEBSTER,Messi Twedt Date Additional Medicare IM given:   Additional Medicare IM given by:    Discharge Disposition:  SKILLED NURSING FACILITY  Per UR Regulation:  Reviewed for med. necessity/level of care/duration of stay  If discussed at Long Length of Stay Meetings, dates discussed:   11/11/2014    Comments:

## 2014-11-11 NOTE — Clinical Social Work Note (Signed)
Patient will discharge to [name] Anticipated discharge date:11/11/14 Family notified: Tiney RougeDonald Owens- pt grandson Transportation by [employer]- scheduled for 11:30am  CSW signing off.  Merlyn LotJenna Holoman, LCSWA Clinical Social Worker [phone]

## 2014-11-11 NOTE — Discharge Summary (Signed)
Discharge Summary  [facility] ZOX:[id number] DOB: December 04, 1928  PCP: Julian Hy, MD  Admit date: 11/06/2014 Discharge date: 11/11/2014  Time spent: >54mins  Recommendations for Outpatient Follow-up:  1. F/u with PMD in one week, pmd to repeat 2view chest x ray to compare with cxr from 3/26 (fullness in the right hilar region)  Discharge Diagnoses:  Active Hospital Problems   Diagnosis Date Noted  . COPD exacerbation 05/15/2014  . HLD (hyperlipidemia) 11/07/2014  . Diarrhea 11/07/2014  . RBBB 09/23/2014  . CKD (chronic kidney disease), stage II 09/20/2014  . GERD (gastroesophageal reflux disease) 05/18/2014  . Chronic diastolic heart failure 05/17/2014  . Essential hypertension, benign 11/05/2012  . Hypothyroidism 11/05/2012    Resolved Hospital Problems   Diagnosis Date Noted Date Resolved  No resolved problems to display.    Discharge Condition: stable  Diet recommendation: heart healthy/carb modified  Filed Weights   11/08/14 0539 11/09/14 0421  Weight: 58.2 kg (128 lb 4.9 oz) 59 kg (130 lb 1.1 oz)    History of present illness:  [name] [name] is a 81 y.o. female with past medical history of hypertension, hyperlipidemia, hypothyroidism, GERD, COPD on prn 2L oxygen , congestive heart failure, chronic kidney disease-stage III, who presents with cough and shortness of breath.  Patient reports that she has worsening shortness of breath in the past several days. She has productive cough with white mucus production. She does not have fever or chills. She also has mild chest pain, which related to coughing. She reports having diarrhea with mild abdominal pain recently. She has 2 or 3 loose stools each day. Patient denies fever, chills, headaches, dysuria, urgency, frequency, hematuria, skin rashes. No unilateral weakness, numbness or tingling sensations. No vision change or hearing loss.  In ED, patient was found to have BNP 75.8, negative troponin,  normal temperature, slightly tachycardic, electrolytes okay. Chest x-ray has no infiltration, but shows fullness in the right hilar region, this may be vascular, but cannot exclude right hilar adenopathy or mass per radiologist. The patient is admitted to inpatient   Hospital Course:  Principal Problem:   COPD exacerbation Active Problems:   Essential hypertension, benign   Hypothyroidism   Chronic diastolic heart failure   GERD (gastroesophageal reflux disease)   CKD (chronic kidney disease), stage II   RBBB   HLD (hyperlipidemia)   Diarrhea  Dyspnea/cough secondary to COPD exacerbation -Chest x-ray: Negative for infiltrate -received nebulizer treatments, Solu-Medrol, antibioticx, antitussives -Blood culture 1/2 GPC, final culture showed contamination. Received vanc initially. -respiratory viral panel negative -Influenza PCR negative, strep pneumoniae and legionella urine antigens negative -tapered off steroid   Chronic Diastolic congestive heart failure -Echocardiogram 09/23/2014 shows an A. fib 55-60%, grade 1 diastolic dysfunction -BNP 75.8 -Appears to currently be euvolemic -Continue daily weights, intake and output monitoring -Troponin negative  -Continue home medications, aspirin , coreg, Lasix. No edema at discharge  Hypertension -Stable, continue lisinopril, Coreg, Lasix  Hypothyroidism -Continue Synthroid, tsh wnl  GERD -Continue PPI  Hyperlipidemia -Continue statin  Diarrhea -Likely secondary to miralax --no bm in the last 48hrs, c/o generalized ab pain, no n/v, no leukocytosis, kub showed increased stool. Received stool softener, with observed formed stool.  Chronic kidney disease, stage III -Currently stable, continue to monitor BMP  Abnormal chest x-ray -Shows no infiltration however fullness in the right hilar region, may be vascular cannot exclude right hilar adenopathy or mass -Patient should have repeat chest x-ray in 4-6 weeks  Generalized  weakness -PT consulted and  continue SNF  Code Status: DNR  Family Communication: None at bedside    Procedures  None  Consults  None  Discharge Exam: BP 139/63 mmHg  Pulse 67  Temp(Src) 98 F (36.7 C) (Oral)  Resp 18  Wt 59 kg (130 lb 1.1 oz)  SpO2 96%  General: Not in acute distress HEENT:  Eyes: PERRL, EOMI, no scleral icterus  ENT: No discharge from the ears and nose, no pharynx injection, no tonsillar enlargement.   Neck: No JVD, no bruit, no mass felt. Cardiac: S1/S2, RRR, No murmurs, No gallops or rubs Pulm: no wheezes, diminished at bases, crackles at bases, clears with deep breath/cough. Abd: Soft, nondistended, nontender, no rebound pain, no organomegaly, BS present Ext: no edema. 2+DP/PT pulse bilaterally Musculoskeletal: No joint deformities, erythema, or stiffness, ROM full Skin: No rashes.  Neuro: Alert and oriented X3, cranial nerves II-XII grossly intact, muscle strength 5/5 in all extremeties, sensation to light touch intact. Psych: Patient is not psychotic, no suicidal or hemocidal ideation.   Discharge Instructions You were cared for by a hospitalist during your hospital stay. If you have any questions about your discharge medications or the care you received while you were in the hospital after you are discharged, you can call the unit and asked to speak with the hospitalist on call if the hospitalist that took care of you is not available. Once you are discharged, your primary care physician will handle any further medical issues. Please note that NO REFILLS for any discharge medications will be authorized once you are discharged, as it is imperative that you return to your primary care physician (or establish a relationship with a primary care physician if you do not have one) for your aftercare needs so that they can reassess your need for medications and monitor your lab values.  Discharge Instructions    Diet - low sodium heart  healthy    Complete by:  As directed      Increase activity slowly    Complete by:  As directed             Medication List    TAKE these medications        albuterol (2.5 MG/3ML) 0.083% nebulizer solution  Commonly known as:  PROVENTIL  Take 2.5 mg by nebulization 4 (four) times daily.     aspirin 81 MG chewable tablet  Chew 1 tablet (81 mg total) by mouth daily.     carvedilol 3.125 MG tablet  Commonly known as:  COREG  Take 1 tablet (3.125 mg total) by mouth 2 (two) times daily with a meal.     cholecalciferol 1000 UNITS tablet  Commonly known as:  VITAMIN D  Take 1,000 Units by mouth daily.     feeding supplement (ENSURE COMPLETE) Liqd  Take 237 mLs by mouth 2 (two) times daily between meals.     furosemide 40 MG tablet  Commonly known as:  LASIX  Take 1 tablet (40 mg total) by mouth daily.     guaiFENesin 600 MG 12 hr tablet  Commonly known as:  MUCINEX  Take 1 tablet (600 mg total) by mouth 2 (two) times daily.     HYDROcodone-acetaminophen 5-325 MG per tablet  Commonly known as:  NORCO/VICODIN  Take 1 tablet by mouth every 6 (six) hours as needed for moderate pain.     ipratropium 0.02 % nebulizer solution  Commonly known as:  ATROVENT  Take 0.5 mg by nebulization 4 (four) times daily.  levothyroxine 137 MCG tablet  Commonly known as:  SYNTHROID, LEVOTHROID  Take 137 mcg by mouth daily before breakfast.     lisinopril 2.5 MG tablet  Commonly known as:  PRINIVIL,ZESTRIL  Take 1 tablet (2.5 mg total) by mouth daily.     omeprazole 20 MG tablet  Commonly known as:  PRILOSEC OTC  Take 20 mg by mouth daily.     polyethylene glycol packet  Commonly known as:  MIRALAX / GLYCOLAX  Take 17 g by mouth 2 (two) times daily.     potassium chloride SA 20 MEQ tablet  Commonly known as:  K-DUR,KLOR-CON  Take 20 mEq by mouth daily.     pravastatin 40 MG tablet  Commonly known as:  PRAVACHOL  Take 40 mg by mouth daily at 6 PM.       No Known  Allergies    The results of significant diagnostics from this hospitalization (including imaging, microbiology, ancillary and laboratory) are listed below for reference.    Significant Diagnostic Studies: Dg Chest 2 View (if Patient Has Fever And/or Copd)  11/06/2014   CLINICAL DATA:  Sudden onset respiratory distress. Low O2 sats. History of CHF, COPD.  EXAM: CHEST  2 VIEW  COMPARISON:  09/20/2014  FINDINGS: Heart is borderline in size. Mediastinal contours are within normal limits. Predominately linear opacities in the right middle lobe, likely atelectasis. Mild fullness in the right hilum. No focal opacity on the left. No effusions.  Mild compression fracture in the lower thoracic spine, likely chronic. Diffuse osteopenia.  IMPRESSION: Right middle lobe atelectasis. Fullness in the right hilar region. This may be vascular, but I cannot exclude right hilar adenopathy or mass.   Electronically Signed   By: Charlett NoseKevin  Dover M.D.   On: 11/06/2014 22:46   Dg Abd Portable 1v  11/10/2014   CLINICAL DATA:  Abdominal pain since this morning. Also we shortness of breath.  EXAM: PORTABLE ABDOMEN - 1 VIEW  COMPARISON:  None.  FINDINGS: No bowel dilation is seen to suggest obstruction or generalized adynamic ileus. Mild increased stool in the colon.  No evidence of renal or ureteral stones. Soft tissues are unremarkable.  Bony structures are demineralized. There are degenerative changes of the visualized spine and arthropathic changes of both hips.  IMPRESSION: 1. No acute findings. No evidence of bowel obstruction or generalized adynamic ileus. No evidence of renal or ureteral stones. 2. Mild increased stool in the colon.   Electronically Signed   By: Amie Portlandavid  Ormond M.D.   On: 11/10/2014 11:26    Microbiology: Recent Results (from the past 240 hour(s))  Culture, blood (routine x 2) Call MD if unable to obtain prior to antibiotics being given     Status: None   Collection Time: 11/07/14  7:50 AM  Result Value Ref  Range Status   Specimen Description BLOOD RIGHT ANTECUBITAL  Final   Special Requests BOTTLES DRAWN AEROBIC ONLY 10CC  Final   Culture   Final    STAPHYLOCOCCUS SPECIES (COAGULASE NEGATIVE) Note: THE SIGNIFICANCE OF ISOLATING THIS ORGANISM FROM A SINGLE SET OF BLOOD CULTURES WHEN MULTIPLE SETS ARE DRAWN IS UNCERTAIN. PLEASE NOTIFY THE MICROBIOLOGY DEPARTMENT WITHIN ONE WEEK IF SPECIATION AND SENSITIVITIES ARE REQUIRED. Note: Gram Stain Report Called to,Read Back By and Verified With: Anthoney HaradaADRIAN SLOAN BY INGRAM A 11/08/14 1020AM Performed at [facility]    Report Status 11/09/2014 FINAL  Final  Culture, blood (routine x 2) Call MD if unable to obtain prior to antibiotics being  given     Status: None (Preliminary result)   Collection Time: 11/07/14  7:53 AM  Result Value Ref Range Status   Specimen Description BLOOD LEFT WRIST  Final   Special Requests BOTTLES DRAWN AEROBIC ONLY  10CC  Final   Culture   Final           BLOOD CULTURE RECEIVED NO GROWTH TO DATE CULTURE WILL BE HELD FOR 5 DAYS BEFORE ISSUING A FINAL NEGATIVE REPORT Performed at [facility]    Report Status PENDING  Incomplete  Respiratory virus panel     Status: None   Collection Time: 11/07/14  8:39 AM  Result Value Ref Range Status   Source - RVPAN NASAL SWAB  Corrected   Respiratory Syncytial Virus A Negative Negative Final   Respiratory Syncytial Virus B Negative Negative Final   Influenza A Negative Negative Final   Influenza B Negative Negative Final   Parainfluenza 1 Negative Negative Final   Parainfluenza 2 Negative Negative Final   Parainfluenza 3 Negative Negative Final   Metapneumovirus Negative Negative Final   Rhinovirus Negative Negative Final   Adenovirus Negative Negative Final    Comment: (NOTE) Performed At: [facility] [address] [place], [place] [id number] Mila Homer MD WU:[id number]   Culture, blood (routine x 2)     Status: None (Preliminary result)   Collection  Time: 11/08/14 12:10 PM  Result Value Ref Range Status   Specimen Description BLOOD RIGHT ANTECUBITAL  Final   Special Requests BOTTLES DRAWN AEROBIC ONLY 7CC  Final   Culture   Final           BLOOD CULTURE RECEIVED NO GROWTH TO DATE CULTURE WILL BE HELD FOR 5 DAYS BEFORE ISSUING A FINAL NEGATIVE REPORT Performed at [facility]    Report Status PENDING  Incomplete  Culture, blood (routine x 2)     Status: None (Preliminary result)   Collection Time: 11/08/14 12:15 PM  Result Value Ref Range Status   Specimen Description BLOOD RIGHT WRIST  Final   Special Requests BOTTLES DRAWN AEROBIC ONLY 2CC  Final   Culture   Final           BLOOD CULTURE RECEIVED NO GROWTH TO DATE CULTURE WILL BE HELD FOR 5 DAYS BEFORE ISSUING A FINAL NEGATIVE REPORT Performed at [facility]    Report Status PENDING  Incomplete     Labs: Basic Metabolic Panel:  Recent Labs Lab 11/06/14 2213 11/09/14 0500 11/10/14 0406 11/11/14 0421  NA 139 138 139 139  K 4.1 4.2 4.0 3.8  CL 102 101 99 98  CO2 31 30 33* 32  GLUCOSE 117* 129* 122* 126*  BUN 19 32* 34* 30*  CREATININE 1.09 0.89 0.74 0.78  CALCIUM 9.2 8.9 8.9 8.7  MG  --   --   --  2.1   Liver Function Tests: No results for input(s): AST, ALT, ALKPHOS, BILITOT, PROT, ALBUMIN in the last 168 hours. No results for input(s): LIPASE, AMYLASE in the last 168 hours. No results for input(s): AMMONIA in the last 168 hours. CBC:  Recent Labs Lab 11/06/14 2213 11/08/14 1210 11/09/14 0500 11/10/14 0406 11/11/14 0421  WBC 7.9 12.2* 10.6* 7.0 6.5  HGB 14.0 13.9 12.8 13.0 12.7  HCT 43.9 43.5 40.0 41.4 40.2  MCV 92.0 93.1 93.7 92.6 92.0  PLT 208 230 214 213 199   Cardiac Enzymes:  Recent Labs Lab 11/07/14 0750 11/07/14 1826  TROPONINI <0.03 <0.03  BNP: BNP (last 3 results)  Recent Labs  09/20/14 1010 11/06/14 2213  BNP 113.7* 75.8    ProBNP (last 3 results)  Recent Labs  01/28/14 1132 05/15/14 2119 05/17/14 1446   PROBNP 785.1* 892.3* 690.8*    CBG:  Recent Labs Lab 11/07/14 1122 11/07/14 1629 11/07/14 2109 11/08/14 0653 11/08/14 1123  GLUCAP 183* 147* 215* 176* 122*       Signed:  Gaylin Bulthuis MD, PhD  [facility] 11/11/2014, 9:29 AM

## 2014-11-12 ENCOUNTER — Telehealth: Payer: Self-pay | Admitting: Cardiology

## 2014-11-12 NOTE — Telephone Encounter (Signed)
New Message    Patients grandson would like to know if the appt is still necessary because the patient just got out of the hospital the other day. Please give patient a call to let them know if the appt is still necessary. Thanks.

## 2014-11-12 NOTE — Telephone Encounter (Signed)
Contacted the pts Alain HoneyGrandson, Donald to inform him that the pt should keep her follow-up appt with Dr Delton SeeNelson on Monday 11/15/14 at 2:15 pm, being the pt was discharged from the hospital yesterday.  Informed Donald of our office location, and time to be here.  Dorinda HillDonald verbalized understanding, stating he will contact [facility], where pt lives, and inform there transportation tech of appt date, time and location.  Dorinda HillDonald states he will call our office back with any conflicting transportation issues, to reschedule for a new date.

## 2014-11-13 ENCOUNTER — Emergency Department (HOSPITAL_COMMUNITY): Payer: Medicare Other

## 2014-11-13 ENCOUNTER — Emergency Department (HOSPITAL_COMMUNITY)
Admission: EM | Admit: 2014-11-13 | Discharge: 2014-11-13 | Disposition: A | Discharge status: Home / Self Care | Payer: Medicare Other | Attending: Emergency Medicine | Admitting: Emergency Medicine

## 2014-11-13 ENCOUNTER — Encounter (HOSPITAL_COMMUNITY): Payer: Self-pay

## 2014-11-13 DIAGNOSIS — Z7982 Long term (current) use of aspirin: Secondary | ICD-10-CM | POA: Diagnosis not present

## 2014-11-13 DIAGNOSIS — E785 Hyperlipidemia, unspecified: Secondary | ICD-10-CM | POA: Insufficient documentation

## 2014-11-13 DIAGNOSIS — J449 Chronic obstructive pulmonary disease, unspecified: Secondary | ICD-10-CM

## 2014-11-13 DIAGNOSIS — Z87891 Personal history of nicotine dependence: Secondary | ICD-10-CM | POA: Diagnosis not present

## 2014-11-13 DIAGNOSIS — J441 Chronic obstructive pulmonary disease with (acute) exacerbation: Secondary | ICD-10-CM | POA: Diagnosis not present

## 2014-11-13 DIAGNOSIS — H9190 Unspecified hearing loss, unspecified ear: Secondary | ICD-10-CM | POA: Insufficient documentation

## 2014-11-13 DIAGNOSIS — N183 Chronic kidney disease, stage 3 (moderate): Secondary | ICD-10-CM | POA: Insufficient documentation

## 2014-11-13 DIAGNOSIS — E079 Disorder of thyroid, unspecified: Secondary | ICD-10-CM | POA: Insufficient documentation

## 2014-11-13 DIAGNOSIS — Z79899 Other long term (current) drug therapy: Secondary | ICD-10-CM | POA: Diagnosis not present

## 2014-11-13 DIAGNOSIS — R0602 Shortness of breath: Secondary | ICD-10-CM

## 2014-11-13 DIAGNOSIS — E559 Vitamin D deficiency, unspecified: Secondary | ICD-10-CM | POA: Insufficient documentation

## 2014-11-13 DIAGNOSIS — K219 Gastro-esophageal reflux disease without esophagitis: Secondary | ICD-10-CM | POA: Diagnosis not present

## 2014-11-13 DIAGNOSIS — I129 Hypertensive chronic kidney disease with stage 1 through stage 4 chronic kidney disease, or unspecified chronic kidney disease: Secondary | ICD-10-CM | POA: Insufficient documentation

## 2014-11-13 LAB — CBC WITH DIFFERENTIAL/PLATELET
BASOS ABS: 0 10*3/uL (ref 0.0–0.1)
BASOS PCT: 0 % (ref 0–1)
EOS PCT: 2 % (ref 0–5)
Eosinophils Absolute: 0.2 10*3/uL (ref 0.0–0.7)
HEMATOCRIT: 45.8 % (ref 36.0–46.0)
Hemoglobin: 14.4 g/dL (ref 12.0–15.0)
LYMPHS ABS: 0.9 10*3/uL (ref 0.7–4.0)
Lymphocytes Relative: 10 % — ABNORMAL LOW (ref 12–46)
MCH: 29.4 pg (ref 26.0–34.0)
MCHC: 31.4 g/dL (ref 30.0–36.0)
MCV: 93.5 fL (ref 78.0–100.0)
MONO ABS: 0.8 10*3/uL (ref 0.1–1.0)
Monocytes Relative: 8 % (ref 3–12)
NEUTROS ABS: 7.7 10*3/uL (ref 1.7–7.7)
NEUTROS PCT: 80 % — AB (ref 43–77)
PLATELETS: 207 10*3/uL (ref 150–400)
RBC: 4.9 MIL/uL (ref 3.87–5.11)
RDW: 15 % (ref 11.5–15.5)
WBC: 9.6 10*3/uL (ref 4.0–10.5)

## 2014-11-13 LAB — BASIC METABOLIC PANEL
Anion gap: 10 (ref 5–15)
BUN: 24 mg/dL — AB (ref 6–23)
CO2: 31 mmol/L (ref 19–32)
Calcium: 8.9 mg/dL (ref 8.4–10.5)
Chloride: 97 mmol/L (ref 96–112)
Creatinine, Ser: 0.95 mg/dL (ref 0.50–1.10)
GFR calc Af Amer: 62 mL/min — ABNORMAL LOW (ref >90)
GFR calc non Af Amer: 54 mL/min — ABNORMAL LOW (ref >90)
Glucose, Bld: 166 mg/dL — ABNORMAL HIGH (ref 70–99)
Potassium: 3.8 mmol/L (ref 3.5–5.1)
Sodium: 138 mmol/L (ref 135–145)

## 2014-11-13 LAB — I-STAT TROPONIN, ED
TROPONIN I, POC: 0.01 ng/mL (ref 0.00–0.08)
Troponin i, poc: 0 ng/mL (ref 0.00–0.08)

## 2014-11-13 LAB — CULTURE, BLOOD (ROUTINE X 2): Culture: NO GROWTH

## 2014-11-13 LAB — BRAIN NATRIURETIC PEPTIDE: B Natriuretic Peptide: 61.6 pg/mL (ref 0.0–100.0)

## 2014-11-13 MED ORDER — IPRATROPIUM-ALBUTEROL 0.5-2.5 (3) MG/3ML IN SOLN
3.0000 mL | RESPIRATORY_TRACT | Status: DC | PRN
  Administered 2014-11-13: 3 mL via RESPIRATORY_TRACT
  Filled 2014-11-13: qty 3

## 2014-11-13 MED ORDER — NITROGLYCERIN 0.4 MG SL SUBL
0.4000 mg | SUBLINGUAL_TABLET | SUBLINGUAL | Status: DC | PRN
  Administered 2014-11-13: 0.4 mg via SUBLINGUAL
  Filled 2014-11-13: qty 1

## 2014-11-13 NOTE — ED Notes (Signed)
Attempted to call [name] [name] to inform of discharge. Unable to get in contact with patient nurse.

## 2014-11-13 NOTE — ED Notes (Signed)
Roe CoombsDon (grandson) notified patient being discharged

## 2014-11-13 NOTE — ED Notes (Signed)
Patient transported to X-ray 

## 2014-11-13 NOTE — ED Notes (Signed)
ptar contacted

## 2014-11-13 NOTE — Discharge Instructions (Signed)

## 2014-11-13 NOTE — ED Provider Notes (Signed)
CSN: [id number]     Arrival date & time 11/13/14  1445 History   First MD Initiated Contact with Patient 11/13/14 1503     Chief Complaint  Patient presents with  . Shortness of Breath    chest pain     (Consider location/radiation/quality/duration/timing/severity/associated sxs/prior Treatment) Patient is a 81 y.o. female presenting with shortness of breath. The history is provided by the patient.  Shortness of Breath Severity:  Moderate Onset quality:  Gradual Timing:  Constant Progression:  Improving Chronicity:  New Relieved by:  None tried Worsened by:  Nothing tried Ineffective treatments:  None tried Associated symptoms: chest pain (vague, non-specific)   Associated symptoms: no cough, no fever and no wheezing     Past Medical History  Diagnosis Date  . Hypertension   . Hyperlipidemia   . Thyroid disease   . Hypokalemia   . Vitamin D deficiency   . COPD (chronic obstructive pulmonary disease)   . CHF (congestive heart failure)   . GERD (gastroesophageal reflux disease)   . CKD (chronic kidney disease), stage III    Past Surgical History  Procedure Laterality Date  . Cholecystectomy    . Abdominal hysterectomy     Family History  Problem Relation Age of Onset  . Heart attack Mother   . Heart attack Brother    History  Substance Use Topics  . Smoking status: Former Smoker    Types: Cigarettes  . Smokeless tobacco: Never Used     Comment: quit smoking years ago  . Alcohol Use: No   OB History    No data available     Review of Systems  Constitutional: Negative for fever.  Respiratory: Positive for shortness of breath. Negative for cough and wheezing.   Cardiovascular: Positive for chest pain (vague, non-specific).  All other systems reviewed and are negative.     Allergies  Review of patient's allergies indicates no known allergies.  Home Medications   Prior to Admission medications   Medication Sig Start Date End Date Taking? Authorizing  Provider  albuterol (PROVENTIL) (2.5 MG/3ML) 0.083% nebulizer solution Take 2.5 mg by nebulization 4 (four) times daily.   Yes Historical Provider, MD  aspirin 81 MG chewable tablet Chew 1 tablet (81 mg total) by mouth daily. 09/25/14  Yes Leroy Sea, MD  carvedilol (COREG) 3.125 MG tablet Take 1 tablet (3.125 mg total) by mouth 2 (two) times daily with a meal. 09/24/14  Yes Leroy Sea, MD  cholecalciferol (VITAMIN D) 1000 UNITS tablet Take 1,000 Units by mouth daily.   Yes Historical Provider, MD  furosemide (LASIX) 40 MG tablet Take 1 tablet (40 mg total) by mouth daily. 09/25/14  Yes Leroy Sea, MD  guaiFENesin (MUCINEX) 600 MG 12 hr tablet Take 1 tablet (600 mg total) by mouth 2 (two) times daily. 11/11/14  Yes Albertine Grates, MD  HYDROcodone-acetaminophen (NORCO/VICODIN) 5-325 MG per tablet Take 1 tablet by mouth every 6 (six) hours as needed for moderate pain. 11/11/14  Yes Albertine Grates, MD  ipratropium (ATROVENT) 0.02 % nebulizer solution Take 0.5 mg by nebulization 4 (four) times daily.   Yes Historical Provider, MD  levothyroxine (SYNTHROID, LEVOTHROID) 137 MCG tablet Take 137 mcg by mouth daily before breakfast.   Yes Historical Provider, MD  omeprazole (PRILOSEC OTC) 20 MG tablet Take 20 mg by mouth daily.   Yes Historical Provider, MD  OVER THE COUNTER MEDICATION Take 90 mLs by mouth 2 (two) times daily. Med Pass   Yes  Historical Provider, MD  polyethylene glycol (MIRALAX / GLYCOLAX) packet Take 17 g by mouth 2 (two) times daily. 09/25/14  Yes Leroy SeaPrashant K Singh, MD  potassium chloride SA (K-DUR,KLOR-CON) 20 MEQ tablet Take 20 mEq by mouth daily.   Yes Historical Provider, MD  pravastatin (PRAVACHOL) 40 MG tablet Take 40 mg by mouth daily at 6 PM.    Yes Historical Provider, MD  feeding supplement, ENSURE COMPLETE, (ENSURE COMPLETE) LIQD Take 237 mLs by mouth 2 (two) times daily between meals. 09/24/14   Leroy SeaPrashant K Singh, MD  lisinopril (PRINIVIL,ZESTRIL) 2.5 MG tablet Take 1 tablet (2.5 mg  total) by mouth daily. 05/18/14   Hollice EspySendil K Krishnan, MD   BP 111/61 mmHg  Pulse 76  Temp(Src) 99.6 F (37.6 C) (Rectal)  Resp 20  SpO2 99% Physical Exam  Constitutional: She is oriented to person, place, and time. She appears well-developed. No distress.  Well-appearing elderly female in no distress. Hard of hearing and difficult to communicate with  HENT:  Head: Normocephalic and atraumatic.  Mouth/Throat: No oropharyngeal exudate.  Eyes: Conjunctivae and EOM are normal. Pupils are equal, round, and reactive to light.  Cardiovascular: Normal rate, regular rhythm, normal heart sounds and intact distal pulses.  Exam reveals no gallop and no friction rub.   No murmur heard. Pulmonary/Chest: Effort normal and breath sounds normal. No respiratory distress. She has no wheezes. She has no rales.  Abdominal: Soft. She exhibits no distension. There is no tenderness.  Musculoskeletal: Normal range of motion. She exhibits no edema or tenderness.  Neurological: She is alert and oriented to person, place, and time.  Skin: Skin is warm and dry. No rash noted. She is not diaphoretic. No pallor.  Psychiatric: She has a normal mood and affect. Her behavior is normal. Judgment and thought content normal.  Nursing note and vitals reviewed.   ED Course  Procedures (including critical care time) Labs Review Labs Reviewed  CBC WITH DIFFERENTIAL/PLATELET - Abnormal; Notable for the following:    Neutrophils Relative % 80 (*)    Lymphocytes Relative 10 (*)    All other components within normal limits  BASIC METABOLIC PANEL - Abnormal; Notable for the following:    Glucose, Bld 166 (*)    BUN 24 (*)    GFR calc non Af Amer 54 (*)    GFR calc Af Amer 62 (*)    All other components within normal limits  BRAIN NATRIURETIC PEPTIDE  I-STAT TROPOININ, ED  I-STAT TROPOININ, ED    Imaging Review Dg Chest 2 View  11/13/2014   CLINICAL DATA:  Shortness of breath, centralized chest pain  EXAM: CHEST  2  VIEW  COMPARISON:  11/06/2014  FINDINGS: Mild enlargement of the cardiomediastinal silhouette is reidentified. Persistent right middle lobe linear atelectasis or scarring. The patient is kyphotic with lower thoracic compression deformities reidentified. No new focal pulmonary opacity. No pleural effusion.  IMPRESSION: Stable findings as above.   Electronically Signed   By: Christiana PellantGretchen  Green M.D.   On: 11/13/2014 17:03     EKG Interpretation   Date/Time:  Saturday November 13 2014 14:52:58 EDT Ventricular Rate:  88 PR Interval:  175 QRS Duration: 147 QT Interval:  402 QTC Calculation: 486 R Axis:   40 Text Interpretation:  Sinus rhythm Right bundle branch block similar to  previous Confirmed by ZAVITZ  MD, JOSHUA ([id number]) on 11/13/2014 3:04:38 PM      MDM   Final diagnoses:  Shortness of breath  Chronic obstructive pulmonary  disease, unspecified COPD, unspecified chronic bronchitis type   81 year old female with heart failure, COPD presents with vague chest pain and shortness of breath of one-day duration. Unable to further describe chest pain. Is symptom-free on my examination. Also states that shortness of breath is improved with DuoNeb in route with EMS. She does have a history of COPD and was recently admitted here for COPD exacerbation. She was just discharged this morning. Denies fever and cough. Is on home 2 L oxygen and is not hypoxic on home oxygen in the emergency department. Lungs are clear to auscultation bilaterally. DuoNeb given and she does admit symptomatic improvement in her shortness of breath. Denies chest pain on my initial exam and throughout stay. Chest x-ray ordered as well as basic labs, troponin, BNP. The patient is otherwise afebrile with stable vital signs and well-appearing. No signs of lower extremity edema and no tachycardia. She has no other risk factors for pulmonary embolus and we will defer workup of that at this time area  Initial troponin negative. Chest x-ray  clear. Remainder of workup unremarkable. Will obtain a delta troponin at 3 hours and if negative and the patient remained symptom-free, she should be stable for outpatient follow-up.  6:49 PM Delta troponin negative. D/C with PCP follow up. Return precautions for chest pain / dyspnea discussed.  Dorna Leitz, MD 11/13/14 1850  Blane Ohara, MD 11/14/14 [phone]

## 2014-11-13 NOTE — ED Notes (Signed)
Pt, was at [name] , after lunch pt. Developed sob and chest pain.  Pt. Has received 10 mg of albuterol and .5 of Atrovent.  Upon arrival to ed  Pt. Reports that her chest pain 8/10  , non -radiating with sob.  Pt. Is on 2L [place] continuous.  Skin is warm and dry.

## 2014-11-14 LAB — CULTURE, BLOOD (ROUTINE X 2)
Culture: NO GROWTH
Culture: NO GROWTH

## 2014-11-15 ENCOUNTER — Ambulatory Visit: Payer: Medicare Other | Admitting: Cardiology

## 2014-11-29 ENCOUNTER — Encounter: Payer: Self-pay | Admitting: Cardiology

## 2014-12-09 ENCOUNTER — Encounter: Payer: Self-pay | Admitting: Cardiology

## 2014-12-09 ENCOUNTER — Ambulatory Visit (INDEPENDENT_AMBULATORY_CARE_PROVIDER_SITE_OTHER): Payer: Medicare Other | Admitting: Cardiology

## 2014-12-09 VITALS — BP 104/68 | HR 91 | Ht 64.0 in | Wt 128.6 lb

## 2014-12-09 DIAGNOSIS — J441 Chronic obstructive pulmonary disease with (acute) exacerbation: Secondary | ICD-10-CM | POA: Diagnosis not present

## 2014-12-09 DIAGNOSIS — I5032 Chronic diastolic (congestive) heart failure: Secondary | ICD-10-CM

## 2014-12-09 DIAGNOSIS — S22000A Wedge compression fracture of unspecified thoracic vertebra, initial encounter for closed fracture: Secondary | ICD-10-CM

## 2014-12-09 DIAGNOSIS — J9621 Acute and chronic respiratory failure with hypoxia: Secondary | ICD-10-CM | POA: Diagnosis not present

## 2014-12-09 DIAGNOSIS — Z66 Do not resuscitate: Secondary | ICD-10-CM

## 2014-12-09 DIAGNOSIS — M8448XA Pathological fracture, other site, initial encounter for fracture: Secondary | ICD-10-CM | POA: Diagnosis not present

## 2014-12-09 NOTE — Assessment & Plan Note (Signed)
Echo showed EF 55-60% with grade 1 diastolic dysfunction

## 2014-12-09 NOTE — Assessment & Plan Note (Signed)
Severe kyphoscoliosis

## 2014-12-09 NOTE — Assessment & Plan Note (Signed)
She appears to be end stage.

## 2014-12-09 NOTE — Assessment & Plan Note (Signed)
S/P recent admission (3d this year) with acute on chronic respiratory failure.

## 2014-12-09 NOTE — Assessment & Plan Note (Signed)
She is DNR in SNF

## 2014-12-09 NOTE — Progress Notes (Signed)
12/09/2014 [name] [name]   04/04/1929  [id number]  Primary Physician Julian HySOUTH,STEPHEN ALAN, MD Primary Cardiologist: Dr Delton SeeNelson  HPI:  81 y/o female from [place], moved her in 2013, with a history of COPD. She has had recurrent respiratory failure. She has been admitted 3 times so far this year. She was just discharged 11/11/14. She is at a SNF and is DNR on chronic O2. Echo done 2/11/6 showed an EF of 55-60% with grade 1 diastolic dysfunction. She has carried a diagnosis of diastolic CHF but her BNP has been less than only been abnormal once during her last 3 admission. She was brought to the office today fo a f/u visit. (She actually is here on the wrong day). She is wheelchair bound and on O2. When asked how she was doing she replied "fair". No other significant conversation could be elicited.    Current Outpatient Prescriptions  Medication Sig Dispense Refill  . albuterol (PROVENTIL) (2.5 MG/3ML) 0.083% nebulizer solution Take 2.5 mg by nebulization 4 (four) times daily.    Marland Kitchen. aspirin 81 MG chewable tablet Chew 1 tablet (81 mg total) by mouth daily. 30 tablet 0  . carvedilol (COREG) 3.125 MG tablet Take 1 tablet (3.125 mg total) by mouth 2 (two) times daily with a meal. 30 tablet 0  . cholecalciferol (VITAMIN D) 1000 UNITS tablet Take 1,000 Units by mouth daily.    . feeding supplement, ENSURE COMPLETE, (ENSURE COMPLETE) LIQD Take 237 mLs by mouth 2 (two) times daily between meals. 90 Bottle 0  . furosemide (LASIX) 40 MG tablet Take 1 tablet (40 mg total) by mouth daily. 30 tablet 0  . guaiFENesin (MUCINEX) 600 MG 12 hr tablet Take 1 tablet (600 mg total) by mouth 2 (two) times daily. 14 tablet 3  . HYDROcodone-acetaminophen (NORCO/VICODIN) 5-325 MG per tablet Take 1 tablet by mouth every 6 (six) hours as needed for moderate pain. 10 tablet 0  . ipratropium (ATROVENT) 0.02 % nebulizer solution Take 0.5 mg by nebulization 4 (four) times daily.    Marland Kitchen. levothyroxine (SYNTHROID, LEVOTHROID) 137  MCG tablet Take 137 mcg by mouth daily before breakfast.    . lisinopril (PRINIVIL,ZESTRIL) 2.5 MG tablet Take 1 tablet (2.5 mg total) by mouth daily. 30 tablet 1  . omeprazole (PRILOSEC OTC) 20 MG tablet Take 20 mg by mouth daily.    Marland Kitchen. OVER THE COUNTER MEDICATION Take 90 mLs by mouth 2 (two) times daily. Med Pass    . polyethylene glycol (MIRALAX / GLYCOLAX) packet Take 17 g by mouth 2 (two) times daily. 14 each 0  . potassium chloride SA (K-DUR,KLOR-CON) 20 MEQ tablet Take 20 mEq by mouth daily.    . pravastatin (PRAVACHOL) 40 MG tablet Take 40 mg by mouth daily at 6 PM.      No current facility-administered medications for this visit.    No Known Allergies  History   Social History  . Marital Status: Single    Spouse Name: N/A  . Number of Children: N/A  . Years of Education: N/A   Occupational History  . Not on file.   Social History Main Topics  . Smoking status: Former Smoker    Types: Cigarettes  . Smokeless tobacco: Never Used     Comment: quit smoking years ago  . Alcohol Use: No  . Drug Use: No  . Sexual Activity: Not Currently    Birth Control/ Protection: Post-menopausal   Other Topics Concern  . Not on file   Social  History Narrative     Review of Systems: General: negative for chills, fever, night sweats or weight changes.  Cardiovascular: negative for chest pain, dyspnea on exertion, edema, orthopnea, palpitations, paroxysmal nocturnal dyspnea or shortness of breath Dermatological: negative for rash Respiratory: negative for cough or wheezing Urologic: negative for hematuria Abdominal: negative for nausea, vomiting, diarrhea, bright red blood per rectum, melena, or hematemesis Neurologic: negative for visual changes, syncope, or dizziness All other systems reviewed and are otherwise negative except as noted above.    Blood pressure 104/68, pulse 91, height  (1.626 m), weight 128 lb 9.6 oz (58.333 kg), SpO2 96 %.  General appearance: alert,  cooperative, no distress and in wheel chair, on O2 Neck: no carotid bruit Lungs: decreased breath sounds, significant kyphoscoliosis Heart: regular rate and rhythm Extremities: trace edema   ASSESSMENT AND PLAN:   Acute on chronic respiratory failure with hypoxia S/P recent admission (3d this year) with acute on chronic respiratory failure.   COPD with exacerbation She appears to be end stage.   Compression fracture of body of thoracic vertebra-wears a brace Severe kyphoscoliosis   Chronic diastolic heart failure Echo showed EF 55-60% with grade 1 diastolic dysfunction   DNR (do not resuscitate) She is DNR in SNF    PLAN  Not much to add to her care from our standpoint. We will see prn.  [facility]PA-[facility] 12/09/2014 3:46 PM

## 2014-12-09 NOTE — Patient Instructions (Signed)
Medication Instructions:  Your physician recommends that you continue on your current medications as directed. Please refer to the Current Medication list given to you today.   Labwork: NONE AT THIS TIME  Testing/Procedures: NONE AT THIS TIME  Follow-Up: CALL OFFICE IF YOU HAVE ANY CARDIAC ISSUES  Any Other Special Instructions Will Be Listed Below (If Applicable).

## 2014-12-10 ENCOUNTER — Ambulatory Visit: Payer: Medicare Other | Admitting: Cardiology

## 2015-01-23 ENCOUNTER — Emergency Department (HOSPITAL_COMMUNITY): Payer: Medicare Other

## 2015-01-23 ENCOUNTER — Encounter (HOSPITAL_COMMUNITY): Payer: Self-pay | Admitting: Emergency Medicine

## 2015-01-23 ENCOUNTER — Inpatient Hospital Stay (HOSPITAL_COMMUNITY)
Admission: EM | Admit: 2015-01-23 | Discharge: 2015-01-25 | DRG: 378 | Disposition: A | Discharge status: Skilled Nursing Facility | Payer: Medicare Other | Attending: Internal Medicine | Admitting: Internal Medicine

## 2015-01-23 DIAGNOSIS — I5032 Chronic diastolic (congestive) heart failure: Secondary | ICD-10-CM | POA: Diagnosis present

## 2015-01-23 DIAGNOSIS — J441 Chronic obstructive pulmonary disease with (acute) exacerbation: Secondary | ICD-10-CM | POA: Diagnosis present

## 2015-01-23 DIAGNOSIS — Z9981 Dependence on supplemental oxygen: Secondary | ICD-10-CM | POA: Diagnosis not present

## 2015-01-23 DIAGNOSIS — Z800 Family history of malignant neoplasm of digestive organs: Secondary | ICD-10-CM

## 2015-01-23 DIAGNOSIS — N183 Chronic kidney disease, stage 3 (moderate): Secondary | ICD-10-CM | POA: Diagnosis present

## 2015-01-23 DIAGNOSIS — E785 Hyperlipidemia, unspecified: Secondary | ICD-10-CM | POA: Diagnosis present

## 2015-01-23 DIAGNOSIS — I10 Essential (primary) hypertension: Secondary | ICD-10-CM | POA: Diagnosis not present

## 2015-01-23 DIAGNOSIS — K921 Melena: Secondary | ICD-10-CM | POA: Diagnosis present

## 2015-01-23 DIAGNOSIS — E079 Disorder of thyroid, unspecified: Secondary | ICD-10-CM | POA: Diagnosis present

## 2015-01-23 DIAGNOSIS — D62 Acute posthemorrhagic anemia: Secondary | ICD-10-CM | POA: Diagnosis present

## 2015-01-23 DIAGNOSIS — I129 Hypertensive chronic kidney disease with stage 1 through stage 4 chronic kidney disease, or unspecified chronic kidney disease: Secondary | ICD-10-CM | POA: Diagnosis present

## 2015-01-23 DIAGNOSIS — K5791 Diverticulosis of intestine, part unspecified, without perforation or abscess with bleeding: Secondary | ICD-10-CM | POA: Diagnosis not present

## 2015-01-23 DIAGNOSIS — Z87891 Personal history of nicotine dependence: Secondary | ICD-10-CM | POA: Diagnosis not present

## 2015-01-23 DIAGNOSIS — K219 Gastro-esophageal reflux disease without esophagitis: Secondary | ICD-10-CM | POA: Diagnosis present

## 2015-01-23 DIAGNOSIS — Z66 Do not resuscitate: Secondary | ICD-10-CM | POA: Diagnosis present

## 2015-01-23 DIAGNOSIS — Z8249 Family history of ischemic heart disease and other diseases of the circulatory system: Secondary | ICD-10-CM | POA: Diagnosis not present

## 2015-01-23 DIAGNOSIS — J9621 Acute and chronic respiratory failure with hypoxia: Secondary | ICD-10-CM | POA: Diagnosis not present

## 2015-01-23 DIAGNOSIS — K922 Gastrointestinal hemorrhage, unspecified: Secondary | ICD-10-CM | POA: Diagnosis present

## 2015-01-23 DIAGNOSIS — R109 Unspecified abdominal pain: Secondary | ICD-10-CM

## 2015-01-23 DIAGNOSIS — Z79899 Other long term (current) drug therapy: Secondary | ICD-10-CM

## 2015-01-23 DIAGNOSIS — Z7982 Long term (current) use of aspirin: Secondary | ICD-10-CM | POA: Diagnosis not present

## 2015-01-23 DIAGNOSIS — Z9049 Acquired absence of other specified parts of digestive tract: Secondary | ICD-10-CM | POA: Diagnosis present

## 2015-01-23 DIAGNOSIS — K625 Hemorrhage of anus and rectum: Secondary | ICD-10-CM

## 2015-01-23 DIAGNOSIS — E039 Hypothyroidism, unspecified: Secondary | ICD-10-CM | POA: Diagnosis present

## 2015-01-23 DIAGNOSIS — J9611 Chronic respiratory failure with hypoxia: Secondary | ICD-10-CM | POA: Diagnosis present

## 2015-01-23 LAB — COMPREHENSIVE METABOLIC PANEL
ALT: 11 U/L — ABNORMAL LOW (ref 14–54)
AST: 15 U/L (ref 15–41)
Albumin: 3.5 g/dL (ref 3.5–5.0)
Alkaline Phosphatase: 73 U/L (ref 38–126)
Anion gap: 7 (ref 5–15)
BUN: 21 mg/dL — ABNORMAL HIGH (ref 6–20)
CALCIUM: 9.1 mg/dL (ref 8.9–10.3)
CHLORIDE: 101 mmol/L (ref 101–111)
CO2: 32 mmol/L (ref 22–32)
Creatinine, Ser: 0.8 mg/dL (ref 0.44–1.00)
GFR calc Af Amer: >60 mL/min (ref >60)
GFR calc non Af Amer: >60 mL/min (ref >60)
Glucose, Bld: 141 mg/dL — ABNORMAL HIGH (ref 65–99)
Potassium: 4.2 mmol/L (ref 3.5–5.1)
Sodium: 140 mmol/L (ref 135–145)
Total Bilirubin: 0.7 mg/dL (ref 0.3–1.2)
Total Protein: 6 g/dL — ABNORMAL LOW (ref 6.5–8.1)

## 2015-01-23 LAB — URINALYSIS, ROUTINE W REFLEX MICROSCOPIC
Bilirubin Urine: NEGATIVE
Glucose, UA: NEGATIVE mg/dL
Ketones, ur: NEGATIVE mg/dL
NITRITE: NEGATIVE
Protein, ur: 30 mg/dL — AB
Specific Gravity, Urine: 1.013 (ref 1.005–1.030)
UROBILINOGEN UA: 0.2 mg/dL (ref 0.0–1.0)
pH: 5 (ref 5.0–8.0)

## 2015-01-23 LAB — TYPE AND SCREEN
ABO/RH(D): A POS
ANTIBODY SCREEN: NEGATIVE

## 2015-01-23 LAB — CBC
HCT: 42 % (ref 36.0–46.0)
HCT: 40.1 % (ref 36.0–46.0)
Hemoglobin: 13.2 g/dL (ref 12.0–15.0)
Hemoglobin: 12.5 g/dL (ref 12.0–15.0)
MCH: 29.3 pg (ref 26.0–34.0)
MCH: 29.1 pg (ref 26.0–34.0)
MCHC: 31.4 g/dL (ref 30.0–36.0)
MCHC: 31.2 g/dL (ref 30.0–36.0)
MCV: 93.1 fL (ref 78.0–100.0)
MCV: 93.3 fL (ref 78.0–100.0)
Platelets: 225 10*3/uL (ref 150–400)
Platelets: 185 10*3/uL (ref 150–400)
RBC: 4.51 MIL/uL (ref 3.87–5.11)
RBC: 4.3 MIL/uL (ref 3.87–5.11)
RDW: 14.6 % (ref 11.5–15.5)
RDW: 14.7 % (ref 11.5–15.5)
WBC: 10.1 10*3/uL (ref 4.0–10.5)
WBC: 8.2 10*3/uL (ref 4.0–10.5)

## 2015-01-23 LAB — POC OCCULT BLOOD, ED: Fecal Occult Bld: POSITIVE — AB

## 2015-01-23 LAB — URINE MICROSCOPIC-ADD ON

## 2015-01-23 LAB — LIPASE, BLOOD: Lipase: 18 U/L — ABNORMAL LOW (ref 22–51)

## 2015-01-23 LAB — ABO/RH: ABO/RH(D): A POS

## 2015-01-23 LAB — MRSA PCR SCREENING: MRSA by PCR: NEGATIVE

## 2015-01-23 MED ORDER — SODIUM CHLORIDE 0.9 % IJ SOLN
3.0000 mL | Freq: Two times a day (BID) | INTRAMUSCULAR | Status: DC
  Administered 2015-01-24 – 2015-01-25 (×3): 3 mL via INTRAVENOUS

## 2015-01-23 MED ORDER — IPRATROPIUM-ALBUTEROL 0.5-2.5 (3) MG/3ML IN SOLN: 3.0000 mL | RESPIRATORY_TRACT | Status: DC

## 2015-01-23 MED ORDER — MORPHINE SULFATE 2 MG/ML IJ SOLN: 1.0000 mg | INTRAMUSCULAR | Status: DC | PRN

## 2015-01-23 MED ORDER — IOHEXOL 300 MG/ML  SOLN
50.0000 mL | Freq: Once | INTRAMUSCULAR | Status: AC | PRN
  Administered 2015-01-23: 50 mL via ORAL

## 2015-01-23 MED ORDER — OMEPRAZOLE MAGNESIUM 20 MG PO TBEC: 20.0000 mg | DELAYED_RELEASE_TABLET | Freq: Every day | ORAL | Status: DC

## 2015-01-23 MED ORDER — GUAIFENESIN-DM 100-10 MG/5ML PO SYRP: 5.0000 mL | ORAL_SOLUTION | ORAL | Status: DC | PRN

## 2015-01-23 MED ORDER — FUROSEMIDE 10 MG/ML IJ SOLN
INTRAMUSCULAR | Status: AC
  Filled 2015-01-23: qty 2

## 2015-01-23 MED ORDER — IPRATROPIUM-ALBUTEROL 0.5-2.5 (3) MG/3ML IN SOLN
3.0000 mL | Freq: Four times a day (QID) | RESPIRATORY_TRACT | Status: DC
  Administered 2015-01-23 – 2015-01-25 (×6): 3 mL via RESPIRATORY_TRACT
  Filled 2015-01-23 (×7): qty 3

## 2015-01-23 MED ORDER — VITAMIN D3 25 MCG (1000 UNIT) PO TABS
1000.0000 [IU] | ORAL_TABLET | Freq: Every day | ORAL | Status: DC
  Administered 2015-01-24 – 2015-01-25 (×2): 1000 [IU] via ORAL
  Filled 2015-01-23 (×2): qty 1

## 2015-01-23 MED ORDER — PRAVASTATIN SODIUM 40 MG PO TABS
40.0000 mg | ORAL_TABLET | Freq: Every day | ORAL | Status: DC
  Administered 2015-01-24: 40 mg via ORAL
  Filled 2015-01-23 (×2): qty 1

## 2015-01-23 MED ORDER — POLYETHYLENE GLYCOL 3350 17 G PO PACK
17.0000 g | PACK | Freq: Every day | ORAL | Status: DC
  Administered 2015-01-25: 17 g via ORAL
  Filled 2015-01-23 (×3): qty 1

## 2015-01-23 MED ORDER — ACETAMINOPHEN 325 MG PO TABS: 650.0000 mg | ORAL_TABLET | Freq: Four times a day (QID) | ORAL | Status: DC | PRN

## 2015-01-23 MED ORDER — GUAIFENESIN ER 600 MG PO TB12
600.0000 mg | ORAL_TABLET | Freq: Two times a day (BID) | ORAL | Status: DC
Start: 2015-01-23 — End: 2015-01-23

## 2015-01-23 MED ORDER — ACETAMINOPHEN 650 MG RE SUPP: 650.0000 mg | Freq: Four times a day (QID) | RECTAL | Status: DC | PRN

## 2015-01-23 MED ORDER — ONDANSETRON HCL 4 MG PO TABS: 4.0000 mg | ORAL_TABLET | Freq: Four times a day (QID) | ORAL | Status: DC | PRN

## 2015-01-23 MED ORDER — ALBUTEROL SULFATE (2.5 MG/3ML) 0.083% IN NEBU
2.5000 mg | INHALATION_SOLUTION | RESPIRATORY_TRACT | Status: DC | PRN
  Administered 2015-01-23: 2.5 mg via RESPIRATORY_TRACT
  Filled 2015-01-23: qty 3

## 2015-01-23 MED ORDER — HYDROCODONE-ACETAMINOPHEN 5-325 MG PO TABS
1.0000 | ORAL_TABLET | ORAL | Status: DC | PRN
  Administered 2015-01-23 – 2015-01-25 (×4): 1 via ORAL
  Filled 2015-01-23 (×4): qty 1

## 2015-01-23 MED ORDER — IOHEXOL 300 MG/ML  SOLN
100.0000 mL | Freq: Once | INTRAMUSCULAR | Status: AC | PRN
  Administered 2015-01-23: 80 mL via INTRAVENOUS

## 2015-01-23 MED ORDER — LORAZEPAM 0.5 MG PO TABS: 0.5000 mg | ORAL_TABLET | Freq: Three times a day (TID) | ORAL | Status: DC | PRN

## 2015-01-23 MED ORDER — POTASSIUM CHLORIDE CRYS ER 20 MEQ PO TBCR
20.0000 meq | EXTENDED_RELEASE_TABLET | Freq: Every day | ORAL | Status: DC
  Administered 2015-01-24 – 2015-01-25 (×2): 20 meq via ORAL
  Filled 2015-01-23 (×2): qty 1

## 2015-01-23 MED ORDER — ONDANSETRON HCL 4 MG/2ML IJ SOLN: 4.0000 mg | Freq: Four times a day (QID) | INTRAMUSCULAR | Status: DC | PRN

## 2015-01-23 MED ORDER — ALBUTEROL SULFATE (2.5 MG/3ML) 0.083% IN NEBU
2.5000 mg | INHALATION_SOLUTION | Freq: Once | RESPIRATORY_TRACT | Status: AC
Start: 2015-01-23 — End: 2015-01-23
  Administered 2015-01-23: 2.5 mg via RESPIRATORY_TRACT
  Filled 2015-01-23: qty 3

## 2015-01-23 MED ORDER — SODIUM CHLORIDE 0.9 % IV SOLN
INTRAVENOUS | Status: DC
  Administered 2015-01-23: 19:00:00 via INTRAVENOUS

## 2015-01-23 MED ORDER — PANTOPRAZOLE SODIUM 40 MG PO TBEC
40.0000 mg | DELAYED_RELEASE_TABLET | Freq: Every day | ORAL | Status: DC
  Administered 2015-01-24 – 2015-01-25 (×2): 40 mg via ORAL
  Filled 2015-01-23 (×2): qty 1

## 2015-01-23 MED ORDER — FUROSEMIDE 10 MG/ML IJ SOLN
20.0000 mg | Freq: Once | INTRAMUSCULAR | Status: AC
  Administered 2015-01-23: 20 mg via INTRAVENOUS

## 2015-01-23 MED ORDER — GUAIFENESIN 100 MG/5ML PO SYRP
300.0000 mg | ORAL_SOLUTION | Freq: Four times a day (QID) | ORAL | Status: DC
  Administered 2015-01-23 – 2015-01-25 (×6): 300 mg via ORAL
  Filled 2015-01-23 (×10): qty 15

## 2015-01-23 MED ORDER — FUROSEMIDE 40 MG PO TABS
40.0000 mg | ORAL_TABLET | Freq: Every day | ORAL | Status: DC
  Administered 2015-01-24 – 2015-01-25 (×2): 40 mg via ORAL
  Filled 2015-01-23 (×2): qty 1

## 2015-01-23 MED ORDER — LEVOTHYROXINE SODIUM 137 MCG PO TABS
137.0000 ug | ORAL_TABLET | Freq: Every day | ORAL | Status: DC
  Administered 2015-01-24 – 2015-01-25 (×2): 137 ug via ORAL
  Filled 2015-01-23 (×3): qty 1

## 2015-01-23 MED ORDER — ENSURE ENLIVE PO LIQD
237.0000 mL | Freq: Two times a day (BID) | ORAL | Status: DC
  Administered 2015-01-25: 237 mL via ORAL

## 2015-01-23 NOTE — ED Notes (Signed)
Bed: WA14 Expected date:  Expected time:  Means of arrival:  Comments: EMS-abdominal pain 

## 2015-01-23 NOTE — ED Notes (Signed)
POC Occult stool test + pos. RN aware.

## 2015-01-23 NOTE — ED Notes (Signed)
Per EMS: Pt from [facility], pt c/o abd pain x 3 days, woke up this morning with blood in bed and on floor. Pt has wheelchair but able to walk from chair to stretcher.

## 2015-01-23 NOTE — H&P (Signed)
PATIENT DETAILS Name: [name] [name] Age: 81 y.o. Sex: female Date of Birth: 12-31-1928 Admit Date: 01/23/2015 FGB:[id number],BZMCEYE Hessie Diener, MD Referring Physician:Dr Wofford   CHIEF COMPLAINT:  Hematochezia  HPI: [name] [name] is a 80 y.o. female with a Past Medical History of COPD, chronic respiratory failure on home O2, chronic diastolic heart failure, who presents today with the above noted complaint. Patient is a poor historian, she resides in the local skilled nursing facility. She was brought to the emergency room for further evaluation of persistent hematochezia. Per ED note, patient woke up this morning with blood in her bed and on the floor. Apparently rectal bleeding has been ongoing for the past 2-3 days. Patient also endorses vague mid abdominal pain. There is no history of nausea vomiting. Per RN, patient has had approximately 3 bloody bowel movements here in the emergency room. Patient also was noted to be somewhat short of breath, however per RN this close to her usual baseline.  Patient denies any fever, headache or chest pain.  Note-review of nursing home paperwork work -indicates DO NOT RESUSCITATE status with universal DO NOT RESUSCITATE/golden yellow form and MOST form present with the paperwork.   ALLERGIES:  No Known Allergies  PAST MEDICAL HISTORY: Past Medical History  Diagnosis Date  . Hypertension   . Hyperlipidemia   . Thyroid disease   . Hypokalemia   . Vitamin D deficiency   . COPD (chronic obstructive pulmonary disease)   . CHF (congestive heart failure)   . GERD (gastroesophageal reflux disease)   . CKD (chronic kidney disease), stage III     PAST SURGICAL HISTORY: Past Surgical History  Procedure Laterality Date  . Cholecystectomy    . Abdominal hysterectomy      MEDICATIONS AT HOME: Prior to Admission medications   Medication Sig Start Date End Date Taking? Authorizing Provider  albuterol (PROVENTIL) (2.5  MG/3ML) 0.083% nebulizer solution Take 2.5 mg by nebulization 4 (four) times daily.   Yes Historical Provider, MD  aspirin 81 MG chewable tablet Chew 1 tablet (81 mg total) by mouth daily. 09/25/14  Yes Leroy Sea, MD  carvedilol (COREG) 3.125 MG tablet Take 1 tablet (3.125 mg total) by mouth 2 (two) times daily with a meal. 09/24/14  Yes Leroy Sea, MD  cholecalciferol (VITAMIN D) 1000 UNITS tablet Take 1,000 Units by mouth daily.   Yes Historical Provider, MD  feeding supplement, ENSURE COMPLETE, (ENSURE COMPLETE) LIQD Take 237 mLs by mouth 2 (two) times daily between meals. 09/24/14  Yes Leroy Sea, MD  furosemide (LASIX) 40 MG tablet Take 1 tablet (40 mg total) by mouth daily. 09/25/14  Yes Leroy Sea, MD  guaiFENesin (MUCINEX) 600 MG 12 hr tablet Take 1 tablet (600 mg total) by mouth 2 (two) times daily. 11/11/14  Yes Albertine Grates, MD  HYDROcodone-acetaminophen (NORCO/VICODIN) 5-325 MG per tablet Take 1 tablet by mouth every 6 (six) hours as needed for moderate pain. 11/11/14  Yes Albertine Grates, MD  ipratropium (ATROVENT) 0.02 % nebulizer solution Take 0.5 mg by nebulization 4 (four) times daily.   Yes Historical Provider, MD  levothyroxine (SYNTHROID, LEVOTHROID) 137 MCG tablet Take 137 mcg by mouth daily before breakfast.   Yes Historical Provider, MD  lisinopril (PRINIVIL,ZESTRIL) 2.5 MG tablet Take 1 tablet (2.5 mg total) by mouth daily. 05/18/14  Yes Hollice Espy, MD  LORazepam (ATIVAN) 0.5 MG tablet Take 0.5 mg by mouth every 8 (eight) hours as  needed for anxiety.   Yes Historical Provider, MD  omeprazole (PRILOSEC OTC) 20 MG tablet Take 20 mg by mouth daily.   Yes Historical Provider, MD  polyethylene glycol (MIRALAX / GLYCOLAX) packet Take 17 g by mouth 2 (two) times daily. 09/25/14  Yes Leroy Sea, MD  potassium chloride SA (K-DUR,KLOR-CON) 20 MEQ tablet Take 20 mEq by mouth daily.   Yes Historical Provider, MD  pravastatin (PRAVACHOL) 40 MG tablet Take 40 mg by mouth  daily at 6 PM.    Yes Historical Provider, MD  sennosides-docusate sodium (SENOKOT-S) 8.6-50 MG tablet Take 2 tablets by mouth at bedtime.   Yes Historical Provider, MD    FAMILY HISTORY: Family History  Problem Relation Age of Onset  . Heart attack Mother   . Heart attack Brother     SOCIAL HISTORY:  reports that she has quit smoking. Her smoking use included Cigarettes. She has never used smokeless tobacco. She reports that she does not drink alcohol or use illicit drugs. Lives at: SNF Mobility: Wheelchair-Pt has wheelchair but able to walk from chair to stretcher.   REVIEW OF SYSTEMS:  Constitutional:   No  weight loss, night sweats,  Fevers, chills, fatigue.  HEENT:    No headaches, Dysphagia,Tooth/dental problems,Sore throat,   Cardio-vascular: No chest pain,Orthopnea, PND,lower extremity edema, anasarca, palpitations  GI:  No heartburn, indigestion, nausea, vomiting, diarrhea  Resp: No hemoptysis,plueritic chest pain.   Skin:  No rash or lesions.  GU:  No dysuria, change in color of urine, no urgency or frequency.  No flank pain.  Musculoskeletal: No joint pain or swelling.  No decreased range of motion.  No back pain.  Endocrine: No heat intolerance, no cold intolerance, no polyuria, no polydipsia  Psych: No change in mood or affect. No depression or anxiety.  No memory loss.   PHYSICAL EXAM: Blood pressure 127/68, pulse 96, temperature 97.8 F (36.6 C), temperature source Oral, resp. rate 20, SpO2 97 %.  General appearance :Awake, alert-answers most of my questions appropriately. Does appear to be in mild respiratory distress-but speaking in full sentences. HEENT: Atraumatic and Normocephalic, pupils equally reactive to light and accomodation Neck: supple, no JVD. No cervical lymphadenopathy.  Chest:Decreased air entry at bilateral bases-but no rhonchi or rales CVS: S1 S2 regular, no murmurs.  Abdomen: Bowel sounds present, Non tender and not  distended with no gaurding, rigidity or rebound. Extremities: B/L Lower Ext shows no edema, both legs are warm to touch Neurology:  Non focal Skin:No Rash Wounds:N/A  LABS ON ADMISSION:   Recent Labs  01/23/15 1211  NA 140  K 4.2  CL 101  CO2 32  GLUCOSE 141*  BUN 21*  CREATININE 0.80  CALCIUM 9.1    Recent Labs  01/23/15 1211  AST 15  ALT 11*  ALKPHOS 73  BILITOT 0.7  PROT 6.0*  ALBUMIN 3.5    Recent Labs  01/23/15 1211  LIPASE 18*    Recent Labs  01/23/15 1211  WBC 10.1  HGB 13.2  HCT 42.0  MCV 93.1  PLT 225   No results for input(s): CKTOTAL, CKMB, CKMBINDEX, TROPONINI in the last 72 hours. No results for input(s): DDIMER in the last 72 hours. Invalid input(s): POCBNP   RADIOLOGIC STUDIES ON ADMISSION: Dg Chest 2 View  01/23/2015   CLINICAL DATA:  Rectal bleeding today. Abdominal pain with cough and congestion. History of COPD and hypertension. Initial encounter.  EXAM: CHEST  2 VIEW  COMPARISON:  Radiographs 11/13/2014 and  11/06/2014.  FINDINGS: The heart size and mediastinal contours are stable with aortic atherosclerosis. The lungs are hyperinflated with emphysematous changes and chronic right middle lobe atelectasis or scarring. No superimposed airspace disease, edema or significant pleural effusion. The bones are demineralized with a stable lower thoracic compression deformity. No acute osseous findings identified.  IMPRESSION: Stable findings of chronic obstructive pulmonary disease. No acute cardiopulmonary process.   Electronically Signed   By: Carey Bullocks M.D.   On: 01/23/2015 13:21   Ct Abdomen Pelvis W Contrast  01/23/2015   CLINICAL DATA:  Abdominal pain for 3 days.  EXAM: CT ABDOMEN AND PELVIS WITH CONTRAST  TECHNIQUE: Multidetector CT imaging of the abdomen and pelvis was performed using the standard protocol following bolus administration of intravenous contrast.  CONTRAST:  50mL OMNIPAQUE IOHEXOL 300 MG/ML SOLN, 80mL OMNIPAQUE IOHEXOL  300 MG/ML SOLN  COMPARISON:  Abdominal radiograph 11/10/2014. Chest radiographs 11/13/2014.  FINDINGS: Minimal scarring is noted in the lung bases.  The gallbladder is surgically absent. No significant biliary dilatation is seen. The liver, spleen, adrenal glands, and pancreas are unremarkable. Subcentimeter low-density lesions in both kidneys are too small to fully characterize but most likely represent cysts.  There is no evidence of bowel obstruction. There is diffuse colonic diverticulosis with extensive involvement of the sigmoid colon. No definite bowel wall thickening or pericolonic inflammatory changes are seen to indicate acute diverticulitis.  Bladder is unremarkable. Uterus is absent. No pelvic mass is seen. Infrarenal abdominal aortic aneurysm measures up to 3.8 cm in diameter with prominent plaque/ thrombus within the aneurysm sac. Moderate diffuse atherosclerotic vascular calcification is present in the abdomen and pelvis. No free fluid or enlarged lymph nodes are identified. Chronic, moderate T11 compression fracture is again seen. Advanced disc space height loss is present at L3-4 and L4-5.  IMPRESSION: 1. Extensive colonic diverticulosis. No significant inflammatory change identified to definitely indicate acute diverticulitis. 2. 3.8 cm infrarenal abdominal aortic aneurysm.   Electronically Signed   By: Sebastian Ache   On: 01/23/2015 15:30    I have personally reviewed images of chest xray    ASSESSMENT AND PLAN: Present on Admission:  . Lower GI bleed: High suspicion for diverticular bleeding. Hemoglobin remained stable. Will type and screen, monitor CBC periodically and transfuse as needed. If bleeding continues, may need a tagged RBC scan-even respiratory status-probably will not be a great candidate for endoscopic evaluation. We will consult gastroenterology  . COPD with exacerbation: Somewhat short of breath-not sure if this is her usual baseline. Not sure if this is a mild COPD  exacerbation or anxiety is playing a role-very few scattered rhonchi on exam. We will start scheduled nebulized bronchodilators and monitor closely. Hold off on starting Solu-Medrol at this time.  . Hypothyroidism: Continue with levothyroxine   . HLD (hyperlipidemia): Continue with statins  . GERD (gastroesophageal reflux disease): Continue with PPI   . Essential hypertension, benign: Will hold all antihypertensives for now, once bleeding resolves we will slowly resume antihypertensives.  . Chronic respiratory failure with hypoxia: Continue home O2  Further plan will depend as patient's clinical course evolves and further radiologic and laboratory data become available. Patient will be monitored closely.  Above noted plan was discussed with patient/Grandson Cecilio Asper the phone), they were in agreement.   CONSULTS: GI  DVT Prophylaxis: SCD's  Code Status: DNR-Universal DNR/MOST form present-and reconfirmed with grandson  Disposition Plan:  Discharge back to SNF possibly in 2-3 days   Total time spent  55  minutes.Greater than 50% of this time was spent in counseling, explanation of diagnosis, planning of further management, and coordination of care.  [facility] Pager [phone]  If 7PM-7AM, please contact night-coverage [email or web address] Password [facility] 01/23/2015, 5:06 PM

## 2015-01-23 NOTE — ED Provider Notes (Signed)
CSN: [id number]     Arrival date & time 01/23/15  1103 History   First MD Initiated Contact with Patient 01/23/15 1212     Chief Complaint  Patient presents with  . Abdominal Pain  . Rectal Bleeding     (Consider location/radiation/quality/duration/timing/severity/associated sxs/prior Treatment) Patient is a 81 y.o. female presenting with abdominal pain and hematochezia.  Abdominal Pain Pain location:  Generalized Pain radiates to:  Does not radiate Pain severity:  Severe Onset quality:  Gradual Duration:  3 days Timing:  Constant Progression:  Unchanged Chronicity:  New Context comment:  Episode of bright red blood per rectum today Relieved by:  Nothing Worsened by:  Nothing tried Associated symptoms: diarrhea and hematochezia   Associated symptoms: no fever, no nausea and no vomiting   Rectal Bleeding Associated symptoms: abdominal pain   Associated symptoms: no fever and no vomiting     Past Medical History  Diagnosis Date  . Hypertension   . Hyperlipidemia   . Thyroid disease   . Hypokalemia   . Vitamin D deficiency   . COPD (chronic obstructive pulmonary disease)   . CHF (congestive heart failure)   . GERD (gastroesophageal reflux disease)   . CKD (chronic kidney disease), stage III    Past Surgical History  Procedure Laterality Date  . Cholecystectomy    . Abdominal hysterectomy     Family History  Problem Relation Age of Onset  . Heart attack Mother   . Heart attack Brother    History  Substance Use Topics  . Smoking status: Former Smoker    Types: Cigarettes  . Smokeless tobacco: Never Used     Comment: quit smoking years ago  . Alcohol Use: No   OB History    No data available     Review of Systems  Constitutional: Negative for fever.  Gastrointestinal: Positive for abdominal pain, diarrhea and hematochezia. Negative for nausea and vomiting.  All other systems reviewed and are negative.     Allergies  Review of patient's allergies  indicates no known allergies.  Home Medications   Prior to Admission medications   Medication Sig Start Date End Date Taking? Authorizing Provider  albuterol (PROVENTIL) (2.5 MG/3ML) 0.083% nebulizer solution Take 2.5 mg by nebulization 4 (four) times daily.   Yes Historical Provider, MD  aspirin 81 MG chewable tablet Chew 1 tablet (81 mg total) by mouth daily. 09/25/14  Yes Leroy Sea, MD  carvedilol (COREG) 3.125 MG tablet Take 1 tablet (3.125 mg total) by mouth 2 (two) times daily with a meal. 09/24/14  Yes Leroy Sea, MD  cholecalciferol (VITAMIN D) 1000 UNITS tablet Take 1,000 Units by mouth daily.   Yes Historical Provider, MD  feeding supplement, ENSURE COMPLETE, (ENSURE COMPLETE) LIQD Take 237 mLs by mouth 2 (two) times daily between meals. 09/24/14  Yes Leroy Sea, MD  furosemide (LASIX) 40 MG tablet Take 1 tablet (40 mg total) by mouth daily. 09/25/14  Yes Leroy Sea, MD  guaiFENesin (MUCINEX) 600 MG 12 hr tablet Take 1 tablet (600 mg total) by mouth 2 (two) times daily. 11/11/14  Yes Albertine Grates, MD  HYDROcodone-acetaminophen (NORCO/VICODIN) 5-325 MG per tablet Take 1 tablet by mouth every 6 (six) hours as needed for moderate pain. 11/11/14  Yes Albertine Grates, MD  ipratropium (ATROVENT) 0.02 % nebulizer solution Take 0.5 mg by nebulization 4 (four) times daily.   Yes Historical Provider, MD  levothyroxine (SYNTHROID, LEVOTHROID) 137 MCG tablet Take 137 mcg by mouth  daily before breakfast.   Yes Historical Provider, MD  lisinopril (PRINIVIL,ZESTRIL) 2.5 MG tablet Take 1 tablet (2.5 mg total) by mouth daily. 05/18/14  Yes Hollice Espy, MD  LORazepam (ATIVAN) 0.5 MG tablet Take 0.5 mg by mouth every 8 (eight) hours as needed for anxiety.   Yes Historical Provider, MD  omeprazole (PRILOSEC OTC) 20 MG tablet Take 20 mg by mouth daily.   Yes Historical Provider, MD  polyethylene glycol (MIRALAX / GLYCOLAX) packet Take 17 g by mouth 2 (two) times daily. 09/25/14  Yes Leroy Sea, MD  potassium chloride SA (K-DUR,KLOR-CON) 20 MEQ tablet Take 20 mEq by mouth daily.   Yes Historical Provider, MD  pravastatin (PRAVACHOL) 40 MG tablet Take 40 mg by mouth daily at 6 PM.    Yes Historical Provider, MD  sennosides-docusate sodium (SENOKOT-S) 8.6-50 MG tablet Take 2 tablets by mouth at bedtime.   Yes Historical Provider, MD   BP 127/68 mmHg  Pulse 96  Temp(Src) 97.8 F (36.6 C) (Oral)  Resp 20  SpO2 97% Physical Exam  Constitutional: She is oriented to person, place, and time. She appears well-developed and well-nourished. No distress.  HENT:  Head: Normocephalic and atraumatic.  Mouth/Throat: Oropharynx is clear and moist.  Eyes: Conjunctivae are normal. Pupils are equal, round, and reactive to light. No scleral icterus.  Neck: Neck supple.  Cardiovascular: Normal rate, regular rhythm, normal heart sounds and intact distal pulses.   No murmur heard. Pulmonary/Chest: Effort normal and breath sounds normal. No stridor. No respiratory distress. She has no rales.  Abdominal: Soft. Bowel sounds are normal. She exhibits no distension. There is tenderness in the epigastric area and periumbilical area. There is no rigidity, no rebound and no guarding.  Genitourinary: Rectal exam shows no external hemorrhoid. Guaiac positive stool (red bloody stool).  Musculoskeletal: Normal range of motion.  Neurological: She is alert and oriented to person, place, and time.  Skin: Skin is warm and dry. No rash noted.  Psychiatric: She has a normal mood and affect. Her behavior is normal.  Nursing note and vitals reviewed.   ED Course  Procedures (including critical care time) Labs Review Labs Reviewed  COMPREHENSIVE METABOLIC PANEL - Abnormal; Notable for the following:    Glucose, Bld 141 (*)    BUN 21 (*)    Total Protein 6.0 (*)    ALT 11 (*)    All other components within normal limits  URINALYSIS, ROUTINE W REFLEX MICROSCOPIC (NOT AT [facility]) - Abnormal; Notable for the  following:    Color, Urine RED (*)    APPearance CLOUDY (*)    Hgb urine dipstick LARGE (*)    Protein, ur 30 (*)    Leukocytes, UA SMALL (*)    All other components within normal limits  LIPASE, BLOOD - Abnormal; Notable for the following:    Lipase 18 (*)    All other components within normal limits  URINE MICROSCOPIC-ADD ON - Abnormal; Notable for the following:    Squamous Epithelial / LPF MANY (*)    Bacteria, UA MANY (*)    Casts HYALINE CASTS (*)    All other components within normal limits  POC OCCULT BLOOD, ED - Abnormal; Notable for the following:    Fecal Occult Bld POSITIVE (*)    All other components within normal limits  CBC  TYPE AND SCREEN  ABO/RH    Imaging Review Dg Chest 2 View  01/23/2015   CLINICAL DATA:  Rectal bleeding today.  Abdominal pain with cough and congestion. History of COPD and hypertension. Initial encounter.  EXAM: CHEST  2 VIEW  COMPARISON:  Radiographs 11/13/2014 and 11/06/2014.  FINDINGS: The heart size and mediastinal contours are stable with aortic atherosclerosis. The lungs are hyperinflated with emphysematous changes and chronic right middle lobe atelectasis or scarring. No superimposed airspace disease, edema or significant pleural effusion. The bones are demineralized with a stable lower thoracic compression deformity. No acute osseous findings identified.  IMPRESSION: Stable findings of chronic obstructive pulmonary disease. No acute cardiopulmonary process.   Electronically Signed   By: Carey Bullocks M.D.   On: 01/23/2015 13:21   Ct Abdomen Pelvis W Contrast  01/23/2015   CLINICAL DATA:  Abdominal pain for 3 days.  EXAM: CT ABDOMEN AND PELVIS WITH CONTRAST  TECHNIQUE: Multidetector CT imaging of the abdomen and pelvis was performed using the standard protocol following bolus administration of intravenous contrast.  CONTRAST:  50mL OMNIPAQUE IOHEXOL 300 MG/ML SOLN, 80mL OMNIPAQUE IOHEXOL 300 MG/ML SOLN  COMPARISON:  Abdominal radiograph  11/10/2014. Chest radiographs 11/13/2014.  FINDINGS: Minimal scarring is noted in the lung bases.  The gallbladder is surgically absent. No significant biliary dilatation is seen. The liver, spleen, adrenal glands, and pancreas are unremarkable. Subcentimeter low-density lesions in both kidneys are too small to fully characterize but most likely represent cysts.  There is no evidence of bowel obstruction. There is diffuse colonic diverticulosis with extensive involvement of the sigmoid colon. No definite bowel wall thickening or pericolonic inflammatory changes are seen to indicate acute diverticulitis.  Bladder is unremarkable. Uterus is absent. No pelvic mass is seen. Infrarenal abdominal aortic aneurysm measures up to 3.8 cm in diameter with prominent plaque/ thrombus within the aneurysm sac. Moderate diffuse atherosclerotic vascular calcification is present in the abdomen and pelvis. No free fluid or enlarged lymph nodes are identified. Chronic, moderate T11 compression fracture is again seen. Advanced disc space height loss is present at L3-4 and L4-5.  IMPRESSION: 1. Extensive colonic diverticulosis. No significant inflammatory change identified to definitely indicate acute diverticulitis. 2. 3.8 cm infrarenal abdominal aortic aneurysm.   Electronically Signed   By: Sebastian Ache   On: 01/23/2015 15:30     EKG Interpretation None      MDM   Final diagnoses:  Abdominal pain  Gastrointestinal hemorrhage, unspecified gastritis, unspecified gastrointestinal hemorrhage type    81 yo female with BRBPR, possibly from diverticulosis.  Hg reassuring, but has had persistent bloody stools in ED.  Also given breathing treatment, has hx of COPD.      Blake Divine, MD 01/23/15 1655

## 2015-01-24 DIAGNOSIS — K922 Gastrointestinal hemorrhage, unspecified: Secondary | ICD-10-CM

## 2015-01-24 LAB — CBC
HCT: 38 % (ref 36.0–46.0)
HEMATOCRIT: 37.7 % (ref 36.0–46.0)
HEMOGLOBIN: 11.8 g/dL — AB (ref 12.0–15.0)
Hemoglobin: 11.6 g/dL — ABNORMAL LOW (ref 12.0–15.0)
MCH: 29.1 pg (ref 26.0–34.0)
MCH: 29 pg (ref 26.0–34.0)
MCHC: 31.1 g/dL (ref 30.0–36.0)
MCHC: 30.8 g/dL (ref 30.0–36.0)
MCV: 93.6 fL (ref 78.0–100.0)
MCV: 94.3 fL (ref 78.0–100.0)
PLATELETS: 180 10*3/uL (ref 150–400)
Platelets: 182 10*3/uL (ref 150–400)
RBC: 4.06 MIL/uL (ref 3.87–5.11)
RBC: 4 MIL/uL (ref 3.87–5.11)
RDW: 14.6 % (ref 11.5–15.5)
RDW: 14.8 % (ref 11.5–15.5)
WBC: 7.6 10*3/uL (ref 4.0–10.5)
WBC: 8.4 10*3/uL (ref 4.0–10.5)

## 2015-01-24 LAB — BASIC METABOLIC PANEL
ANION GAP: 7 (ref 5–15)
BUN: 22 mg/dL — ABNORMAL HIGH (ref 6–20)
CALCIUM: 8.5 mg/dL — AB (ref 8.9–10.3)
CO2: 31 mmol/L (ref 22–32)
Chloride: 102 mmol/L (ref 101–111)
Creatinine, Ser: 0.56 mg/dL (ref 0.44–1.00)
GFR calc Af Amer: >60 mL/min (ref >60)
GFR calc non Af Amer: >60 mL/min (ref >60)
Glucose, Bld: 113 mg/dL — ABNORMAL HIGH (ref 65–99)
POTASSIUM: 3.6 mmol/L (ref 3.5–5.1)
Sodium: 140 mmol/L (ref 135–145)

## 2015-01-24 MED ORDER — BOOST / RESOURCE BREEZE PO LIQD
1.0000 | Freq: Three times a day (TID) | ORAL | Status: DC
  Administered 2015-01-25: 1 via ORAL

## 2015-01-24 MED ORDER — LISINOPRIL 2.5 MG PO TABS
2.5000 mg | ORAL_TABLET | Freq: Every day | ORAL | Status: DC
  Administered 2015-01-24 – 2015-01-25 (×2): 2.5 mg via ORAL
  Filled 2015-01-24 (×2): qty 1

## 2015-01-24 NOTE — Progress Notes (Signed)
Initial Nutrition Assessment  DOCUMENTATION CODES:  Not applicable  INTERVENTION: - Continue Ensure Enlive po BID, each supplement provides 350 kcal and 20 grams of protein - Will order Resource Breeze po TID, each supplement provides 250 kcal and 9 grams of protein - RD will continue to monitor for needs  NUTRITION DIAGNOSIS:  Inadequate oral intake related to acute illness as evidenced by estimated needs, per patient/family report.  GOAL:  Patient will meet greater than or equal to 90% of their needs  MONITOR:  PO intake, Supplement acceptance, Weight trends, Labs, I & O's  REASON FOR ASSESSMENT:  Malnutrition Screening Tool  ASSESSMENT: Per H&P, 81 y.o. female with a Past Medical History of COPD, chronic respiratory failure on home O2, chronic diastolic heart failure, who presents today with the above noted complaint. Patient is a poor historian, she resides in the local skilled nursing facility. She was brought to the emergency room for further evaluation of persistent hematochezia. Per ED note, patient woke up this morning with blood in her bed and on the floor. Apparently rectal bleeding has been ongoing for the past 2-3 days. Patient also endorses vague mid abdominal pain. There is no history of nausea vomiting.  Pt seen for MST. BMI indicates normal weight status. Pt on FLD with no intakes documented. RN reports pt refused breakfast as she was afraid intakes would cause diarrhea. She also refused Ensure for this reason. Pt states she receives Ensure at facility and that she drinks it there. Talked with RN and will order Resource Breeze and monitor for acceptance.  Pt indicates she is still having abdominal pain and that this was going on PTA. Per weight hx, pt has lost 2 lbs in the past 1.5 months (1.5% body weight in this time frame) which is not significant. Physical assessment of upper body does not show muscle or fat wasting; unable to assess legs for pt on bedpan.  Not  meeting needs at this time. Labs and medications reviewed.  Height:  Ht Readings from Last 1 Encounters:  01/23/15 5\' 4"  (1.626 m)    Weight:  Wt Readings from Last 1 Encounters:  01/23/15 126 lb 15.8 oz (57.6 kg)    Ideal Body Weight:  54.5 kg (kg)  Wt Readings from Last 10 Encounters:  01/23/15 126 lb 15.8 oz (57.6 kg)  12/09/14 128 lb 9.6 oz (58.333 kg)  11/09/14 130 lb 1.1 oz (59 kg)  09/25/14 123 lb 3.7 oz (55.898 kg)  05/19/14 134 lb 7.7 oz (61 kg)  12/23/12 123 lb (55.792 kg)  11/05/12 120 lb (54.432 kg)  03/11/12 131 lb 2.8 oz (59.5 kg)    BMI:  Body mass index is 21.79 kg/(m^2).  Estimated Nutritional Needs:  Kcal:  1200-1400  Protein:  55-65 grams  Fluid:  2 L/day  Skin:  Reviewed, no issues  Diet Order:  Diet full liquid Room service appropriate?: Yes; Fluid consistency:: Thin  EDUCATION NEEDS:  No education needs identified at this time   Intake/Output Summary (Last 24 hours) at 01/24/15 1221 Last data filed at 01/24/15 0800  Gross per 24 hour  Intake    700 ml  Output      0 ml  Net    700 ml    Last BM:  6/13   Trenton Gammon, RD, LDN Inpatient Clinical Dietitian Pager # [phone] After hours/weekend pager # [phone]

## 2015-01-24 NOTE — Progress Notes (Signed)
Date:  January 24, 2015 U.R. performed for needs and level of care. Will continue to follow for Case Management needs.  Rhonda Davis, RN, BSN, CCM   [phone] 

## 2015-01-24 NOTE — Progress Notes (Signed)
PATIENT DETAILS Name: [name] [name] Age: 81 y.o. Sex: female Date of Birth: 01-11-1929 Admit Date: 01/23/2015 Admitting Physician Dewayne Shorter Levora Dredge, MD [id number] Hessie Diener, MD  Subjective: No major hematochezia overnight-per RN only a "smudge"  Assessment/Plan: Active Problems: Lower GI bleed:seems to have almost resolved, will continue to monitor an additional day. Follow CBC and transfuse prn. Suspected given her respiratory issues, she would be a poor candidate for endoscopic evaluation.  Mild acute blood loss anemia:secondary to above. No indication for transfusion, Follow CBC  COPD exacerbation:much improved. Moving air-claims breathing at baseline. Continue Nebs  Hypothyroidism: Continue with levothyroxine   HLD (hyperlipidemia): Continue with statins  GERD (gastroesophageal reflux disease): Continue with PPI   Essential hypertension, benign:anti hypertensives held on admission-will resume Lasix and Lisinopril today  Chronic respiratory failure with hypoxia: On home O2-continue  Disposition: Remain inpatient-transfer to med surg-back to SNF on 6/14 if no recurrence of hematochezia  Antimicrobial agents  See below  Anti-infectives    None      DVT Prophylaxis:  SCD's  Code Status:  DNR  Family Communication Leonarda Salon the phone  Procedures: None  CONSULTS:  GI  Time spent 30 minutes-Greater than 50% of this time was spent in counseling, explanation of diagnosis, planning of further management, and coordination of care.  MEDICATIONS: Scheduled Meds: . cholecalciferol  1,000 Units Oral Daily  . feeding supplement (ENSURE ENLIVE)  237 mL Oral BID BM  . furosemide  40 mg Oral Daily  . guaifenesin  300 mg Oral 4 times per day  . ipratropium-albuterol  3 mL Nebulization QID  . levothyroxine  137 mcg Oral QAC breakfast  . pantoprazole  40 mg Oral Daily  . polyethylene glycol  17 g Oral Daily  . potassium chloride  SA  20 mEq Oral Daily  . pravastatin  40 mg Oral q1800  . sodium chloride  3 mL Intravenous Q12H   Continuous Infusions: . sodium chloride 50 mL/hr at 01/24/15 0600   PRN Meds:.acetaminophen **OR** acetaminophen, albuterol, guaiFENesin-dextromethorphan, HYDROcodone-acetaminophen, LORazepam, morphine injection, ondansetron **OR** ondansetron (ZOFRAN) IV    PHYSICAL EXAM: Vital signs in last 24 hours: Filed Vitals:   01/24/15 0357 01/24/15 0400 01/24/15 0500 01/24/15 0600  BP:  109/36 124/51 134/63  Pulse:  81 85 84  Temp: 98.3 F (36.8 C)     TempSrc: Axillary     Resp:  Height:      Weight:      SpO2:  95% 93% 98%    Weight change:  Filed Weights   01/23/15 1825  Weight: 57.6 kg (126 lb 15.8 oz)   Body mass index is 21.79 kg/(m^2).   Gen Exam: Awake and alert with clear speech.   Neck: Supple, No JVD.   Chest: B/L Clear-but diminished air entry at both bases CVS: S1 S2 Regular, no murmurs.  Abdomen: soft, BS +, non tender, non distended.  Extremities: no edema, lower extremities warm to touch. Neurologic: Non Focal.   Skin: No Rash.   Wounds: N/A.   Intake/Output from previous day:  Intake/Output Summary (Last 24 hours) at 01/24/15 0808 Last data filed at 01/24/15 0600  Gross per 24 hour  Intake    600 ml  Output      0 ml  Net    600 ml     LAB RESULTS: CBC  Recent Labs Lab 01/23/15 1211 01/23/15 1824 01/24/15  0235  WBC 10.1 8.2 7.6  HGB 13.2 12.5 11.8*  HCT 42.0 40.1 38.0  PLT 225 185 180  MCV 93.1 93.3 93.6  MCH 29.3 29.1 29.1  MCHC 31.4 31.2 31.1  RDW 14.6 14.7 14.6    Chemistries   Recent Labs Lab 01/23/15 1211 01/24/15 0235  NA 140 140  K 4.2 3.6  CL 101 102  CO2 32 31  GLUCOSE 141* 113*  BUN 21* 22*  CREATININE 0.80 0.56  CALCIUM 9.1 8.5*    CBG: No results for input(s): GLUCAP in the last 168 hours.  GFR Estimated Creatinine Clearance: 45.2 mL/min (by C-G formula based on Cr of 0.56).  Coagulation  profile No results for input(s): INR, PROTIME in the last 168 hours.  Cardiac Enzymes No results for input(s): CKMB, TROPONINI, MYOGLOBIN in the last 168 hours.  Invalid input(s): CK  Invalid input(s): POCBNP No results for input(s): DDIMER in the last 72 hours. No results for input(s): HGBA1C in the last 72 hours. No results for input(s): CHOL, HDL, LDLCALC, TRIG, CHOLHDL, LDLDIRECT in the last 72 hours. No results for input(s): TSH, T4TOTAL, T3FREE, THYROIDAB in the last 72 hours.  Invalid input(s): FREET3 No results for input(s): VITAMINB12, FOLATE, FERRITIN, TIBC, IRON, RETICCTPCT in the last 72 hours.  Recent Labs  01/23/15 1211  LIPASE 18*    Urine Studies No results for input(s): UHGB, CRYS in the last 72 hours.  Invalid input(s): UACOL, UAPR, USPG, UPH, UTP, UGL, UKET, UBIL, UNIT, UROB, ULEU, UEPI, UWBC, URBC, UBAC, CAST, UCOM, BILUA  MICROBIOLOGY: Recent Results (from the past 240 hour(s))  MRSA PCR Screening     Status: None   Collection Time: 01/23/15  6:22 PM  Result Value Ref Range Status   MRSA by PCR NEGATIVE NEGATIVE Final    Comment:        The GeneXpert MRSA Assay (FDA approved for NASAL specimens only), is one component of a comprehensive MRSA colonization surveillance program. It is not intended to diagnose MRSA infection nor to guide or monitor treatment for MRSA infections.     RADIOLOGY STUDIES/RESULTS: Dg Chest 2 View  01/23/2015   CLINICAL DATA:  Rectal bleeding today. Abdominal pain with cough and congestion. History of COPD and hypertension. Initial encounter.  EXAM: CHEST  2 VIEW  COMPARISON:  Radiographs 11/13/2014 and 11/06/2014.  FINDINGS: The heart size and mediastinal contours are stable with aortic atherosclerosis. The lungs are hyperinflated with emphysematous changes and chronic right middle lobe atelectasis or scarring. No superimposed airspace disease, edema or significant pleural effusion. The bones are demineralized with a  stable lower thoracic compression deformity. No acute osseous findings identified.  IMPRESSION: Stable findings of chronic obstructive pulmonary disease. No acute cardiopulmonary process.   Electronically Signed   By: Carey Bullocks M.D.   On: 01/23/2015 13:21   Ct Abdomen Pelvis W Contrast  01/23/2015   CLINICAL DATA:  Abdominal pain for 3 days.  EXAM: CT ABDOMEN AND PELVIS WITH CONTRAST  TECHNIQUE: Multidetector CT imaging of the abdomen and pelvis was performed using the standard protocol following bolus administration of intravenous contrast.  CONTRAST:  50mL OMNIPAQUE IOHEXOL 300 MG/ML SOLN, 80mL OMNIPAQUE IOHEXOL 300 MG/ML SOLN  COMPARISON:  Abdominal radiograph 11/10/2014. Chest radiographs 11/13/2014.  FINDINGS: Minimal scarring is noted in the lung bases.  The gallbladder is surgically absent. No significant biliary dilatation is seen. The liver, spleen, adrenal glands, and pancreas are unremarkable. Subcentimeter low-density lesions in both kidneys are too small to fully characterize  but most likely represent cysts.  There is no evidence of bowel obstruction. There is diffuse colonic diverticulosis with extensive involvement of the sigmoid colon. No definite bowel wall thickening or pericolonic inflammatory changes are seen to indicate acute diverticulitis.  Bladder is unremarkable. Uterus is absent. No pelvic mass is seen. Infrarenal abdominal aortic aneurysm measures up to 3.8 cm in diameter with prominent plaque/ thrombus within the aneurysm sac. Moderate diffuse atherosclerotic vascular calcification is present in the abdomen and pelvis. No free fluid or enlarged lymph nodes are identified. Chronic, moderate T11 compression fracture is again seen. Advanced disc space height loss is present at L3-4 and L4-5.  IMPRESSION: 1. Extensive colonic diverticulosis. No significant inflammatory change identified to definitely indicate acute diverticulitis. 2. 3.8 cm infrarenal abdominal aortic aneurysm.    Electronically Signed   By: Sebastian Ache   On: 01/23/2015 15:30    Jeoffrey Massed, MD  [facility] Pager:[phone]  If 7PM-7AM, please contact night-coverage [email or web address] Password [email or web address] 01/24/2015, 8:08 AM   LOS: 1 day

## 2015-01-24 NOTE — Care Management Note (Signed)
Case Management Note  Patient Details  Name: Quinnie Salvage MRN: [id number] Date of Birth: 1929-02-08  Subjective/Objective:                 Gi bleed with hypotension   Action/Plan: snf  Expected Discharge Date:            [id number]      Expected Discharge Plan:  Skilled Nursing Facility  In-House Referral:  Clinical Social Work  Discharge planning Services  CM Consult  Post Acute Care Choice:  NA Choice offered to:  NA  DME Arranged:  N/A DME Agency:  NA  HH Arranged:  NA HH Agency:  NA  Status of Service:  In process, will continue to follow  Medicare Important Message Given:    Date Medicare IM Given:    Medicare IM give by:    Date Additional Medicare IM Given:    Additional Medicare Important Message give by:     If discussed at Long Length of Stay Meetings, dates discussed:    Additional Comments:  Golda Acre, RN 01/24/2015, 12:16 PM

## 2015-01-24 NOTE — Consult Note (Signed)
Referring Provider: [facility] Primary Care Physician:  Julian Hy, MD Primary Gastroenterologist:  unassigned  Reason for Consultation:  Rectal bleeding    HPI: [name] [name] is a 81 y.o. female who resides in a local nursing home. She has a past medical history of chronic respiratory failure on home oxygen, chronic diastolic heart failure, COPD, thyroid disease, GERD, and chronic kidney disease stage III. She presented to the emergency room yesterday with rectal bleeding. Apparently the patient woke up in her bed at the nursing home with blood on her bed and blood on the floor. She had been having some painless rectal bleeding though not as heavily for 2 or 3 days prior to being brought to the hospital. She also reports some mild abdominal pain that today she points to the left lower quadrant. She has not vomited but she says she is nauseous. Her nurse this morning reports that she has had 2 or 3 very small bowel movements with blood earlier this morning. Patient says she has never had a colonoscopy. She denies ever having similar bouts of bleeding in the past. She says her mother had colon cancer in her early 41s.   Past Medical History  Diagnosis Date  . Hypertension   . Hyperlipidemia   . Thyroid disease   . Hypokalemia   . Vitamin D deficiency   . COPD (chronic obstructive pulmonary disease)   . CHF (congestive heart failure)   . GERD (gastroesophageal reflux disease)   . CKD (chronic kidney disease), stage III     Past Surgical History  Procedure Laterality Date  . Cholecystectomy    . Abdominal hysterectomy      Prior to Admission medications   Medication Sig Start Date End Date Taking? Authorizing Provider  albuterol (PROVENTIL) (2.5 MG/3ML) 0.083% nebulizer solution Take 2.5 mg by nebulization 4 (four) times daily.   Yes Historical Provider, MD  aspirin 81 MG chewable tablet Chew 1 tablet (81 mg total) by mouth daily. 09/25/14  Yes Leroy Sea, MD  carvedilol (COREG) 3.125 MG tablet Take 1 tablet (3.125 mg total) by mouth 2 (two) times daily with a meal. 09/24/14  Yes Leroy Sea, MD  cholecalciferol (VITAMIN D) 1000 UNITS tablet Take 1,000 Units by mouth daily.   Yes Historical Provider, MD  feeding supplement, ENSURE COMPLETE, (ENSURE COMPLETE) LIQD Take 237 mLs by mouth 2 (two) times daily between meals. 09/24/14  Yes Leroy Sea, MD  furosemide (LASIX) 40 MG tablet Take 1 tablet (40 mg total) by mouth daily. 09/25/14  Yes Leroy Sea, MD  guaiFENesin (MUCINEX) 600 MG 12 hr tablet Take 1 tablet (600 mg total) by mouth 2 (two) times daily. 11/11/14  Yes Albertine Grates, MD  HYDROcodone-acetaminophen (NORCO/VICODIN) 5-325 MG per tablet Take 1 tablet by mouth every 6 (six) hours as needed for moderate pain. 11/11/14  Yes Albertine Grates, MD  ipratropium (ATROVENT) 0.02 % nebulizer solution Take 0.5 mg by nebulization 4 (four) times daily.   Yes Historical Provider, MD  levothyroxine (SYNTHROID, LEVOTHROID) 137 MCG tablet Take 137 mcg by mouth daily before breakfast.   Yes Historical Provider, MD  lisinopril (PRINIVIL,ZESTRIL) 2.5 MG tablet Take 1 tablet (2.5 mg total) by mouth daily. 05/18/14  Yes Hollice Espy, MD  LORazepam (ATIVAN) 0.5 MG tablet Take 0.5 mg by mouth every 8 (eight) hours as needed for anxiety.   Yes Historical Provider, MD  omeprazole (PRILOSEC OTC) 20 MG tablet Take 20 mg by mouth daily.  Yes Historical Provider, MD  polyethylene glycol (MIRALAX / GLYCOLAX) packet Take 17 g by mouth 2 (two) times daily. 09/25/14  Yes Leroy Sea, MD  potassium chloride SA (K-DUR,KLOR-CON) 20 MEQ tablet Take 20 mEq by mouth daily.   Yes Historical Provider, MD  pravastatin (PRAVACHOL) 40 MG tablet Take 40 mg by mouth daily at 6 PM.    Yes Historical Provider, MD  sennosides-docusate sodium (SENOKOT-S) 8.6-50 MG tablet Take 2 tablets by mouth at bedtime.   Yes Historical Provider, MD    Current Facility-Administered Medications   Medication Dose Route Frequency Provider Last Rate Last Dose  . acetaminophen (TYLENOL) tablet 650 mg  650 mg Oral Q6H PRN Shanker Levora Dredge, MD       Or  . acetaminophen (TYLENOL) suppository 650 mg  650 mg Rectal Q6H PRN Maretta Bees, MD      . albuterol (PROVENTIL) (2.5 MG/3ML) 0.083% nebulizer solution 2.5 mg  2.5 mg Nebulization Q2H PRN Maretta Bees, MD   2.5 mg at 01/23/15 2318  . cholecalciferol (VITAMIN D) tablet 1,000 Units  1,000 Units Oral Daily Shanker Levora Dredge, MD      . feeding supplement (ENSURE ENLIVE) (ENSURE ENLIVE) liquid 237 mL  237 mL Oral BID BM Shanker Levora Dredge, MD      . furosemide (LASIX) tablet 40 mg  40 mg Oral Daily Shanker Levora Dredge, MD      . guaifenesin (ROBITUSSIN) 100 MG/5ML syrup 300 mg  300 mg Oral 4 times per day Maretta Bees, MD   300 mg at 01/24/15 0659  . guaiFENesin-dextromethorphan (ROBITUSSIN DM) 100-10 MG/5ML syrup 5 mL  5 mL Oral Q4H PRN Shanker Levora Dredge, MD      . HYDROcodone-acetaminophen (NORCO/VICODIN) 5-325 MG per tablet 1-2 tablet  1-2 tablet Oral Q4H PRN Maretta Bees, MD   1 tablet at 01/23/15 2337  . ipratropium-albuterol (DUONEB) 0.5-2.5 (3) MG/3ML nebulizer solution 3 mL  3 mL Nebulization QID Maretta Bees, MD   3 mL at 01/24/15 0743  . levothyroxine (SYNTHROID, LEVOTHROID) tablet 137 mcg  137 mcg Oral QAC breakfast Shanker Levora Dredge, MD      . lisinopril (PRINIVIL,ZESTRIL) tablet 2.5 mg  2.5 mg Oral Daily Shanker Levora Dredge, MD      . LORazepam (ATIVAN) tablet 0.5 mg  0.5 mg Oral Q8H PRN Shanker Levora Dredge, MD      . morphine 2 MG/ML injection 1 mg  1 mg Intravenous Q4H PRN Shanker Levora Dredge, MD      . ondansetron (ZOFRAN) tablet 4 mg  4 mg Oral Q6H PRN Shanker Levora Dredge, MD       Or  . ondansetron (ZOFRAN) injection 4 mg  4 mg Intravenous Q6H PRN Shanker Levora Dredge, MD      . pantoprazole (PROTONIX) EC tablet 40 mg  40 mg Oral Daily Shanker Levora Dredge, MD      . polyethylene glycol (MIRALAX / GLYCOLAX) packet 17 g   17 g Oral Daily Maretta Bees, MD   17 g at 01/23/15 2000  . potassium chloride SA (K-DUR,KLOR-CON) CR tablet 20 mEq  20 mEq Oral Daily Shanker Levora Dredge, MD      . pravastatin (PRAVACHOL) tablet 40 mg  40 mg Oral q1800 Maretta Bees, MD      . sodium chloride 0.9 % injection 3 mL  3 mL Intravenous Q12H Maretta Bees, MD   3 mL at 01/23/15 2200  Allergies as of 01/23/2015  . (No Known Allergies)    Family History  Problem Relation Age of Onset  . Heart attack Mother   . Heart attack Brother     History   Social History  . Marital Status: Single    Spouse Name: N/A  . Number of Children: N/A  . Years of Education: N/A   Occupational History  . Not on file.   Social History Main Topics  . Smoking status: Former Smoker    Types: Cigarettes  . Smokeless tobacco: Never Used     Comment: quit smoking years ago  . Alcohol Use: No  . Drug Use: No  . Sexual Activity: Not Currently    Birth Control/ Protection: Post-menopausal   Other Topics Concern  . Not on file   Social History Narrative    Review of Systems: Gen: Denies any fever, chills, sweats, anorexia, fatigue, weakness, malaise, weight loss, and sleep disorder CV: Denies chest pain, angina, palpitations, syncope, orthopnea, PND, peripheral edema, and claudication. Resp: Has some dyspnea at rest GI: Denies vomiting blood, jaundice, and fecal incontinence.   Has rectal bleeding GU : Denies urinary burning, blood in urine MS: Denies joint pain, limitation of movement, and swelling  Derm: Denies rash, itching, dry skin, hives, moles, warts, or unhealing ulcers.  Psych: Denies depression, anxiety, memory loss, suicidal ideation, hallucinations, paranoia, and confusion. Heme: Denies bruising and enlarged lymph nodes. Neuro:  Denies any headaches, dizziness, paresthesias.   Physical Exam: Vital signs in last 24 hours: Temp:  [97.3 F (36.3 C)-98.3 F (36.8 C)] 97.3 F (36.3 C) (06/13 0800) Pulse  Rate:  [78-102] 87 (06/13 0800) Resp:  [12-30] 19 (06/13 0800) BP: (91-183)/(36-81) 147/78 mmHg (06/13 0800) SpO2:  [93 %-100 %] 96 % (06/13 0800) Weight:  [126 lb 15.8 oz (57.6 kg)] 126 lb 15.8 oz (57.6 kg) (06/12 1825) Last BM Date: 01/23/15 General:   Alert,  Well-developed, well-nourished, pleasant and cooperative in NAD Head:  Normocephalic and atraumatic. Eyes:  Sclera clear, no icterus. Conjunctiva pink. Ears:  Normal auditory acuity. Nose:  No deformity, discharge,  or lesions. Mouth:  No deformity or lesions.   Neck:  Supple; no masses or thyromegaly. Lungs: Diminished breath sounds    Heart:  Regular rate and rhythm Abdomen:  Soft, mild periumbilical and left lower quadrant tenderness to palpation BS active,nonpalp mass or hsm.   Rectal:  Deferred  Msk:  Symmetrical without gross deformities. . Pulses:  Normal pulses noted. Extremities:  Without clubbing or edema. Neurologic: Alert and  oriented x3 Skin: No rashes Psych:  Alert and cooperative. Normal mood and affect.  Intake/Output from previous day: 06/12 0701 - 06/13 0700 In: 600 [I.V.:600] Out: -  Intake/Output this shift: Total I/O In: 100 [I.V.:100] Out: -   Lab Results:  Recent Labs  01/23/15 1211 01/23/15 1824 01/24/15 0235  WBC 10.1 8.2 7.6  HGB 13.2 12.5 11.8*  HCT 42.0 40.1 38.0  PLT 225 185 180   BMET  Recent Labs  01/23/15 1211 01/24/15 0235  NA 140 140  K 4.2 3.6  CL 101 102  CO2 32 31  GLUCOSE 141* 113*  BUN 21* 22*  CREATININE 0.80 0.56  CALCIUM 9.1 8.5*   LFT  Recent Labs  01/23/15 1211  PROT 6.0*  ALBUMIN 3.5  AST 15  ALT 11*  ALKPHOS 73  BILITOT 0.7    Studies/Results: Dg Chest 2 View  01/23/2015   CLINICAL DATA:  Rectal bleeding today. Abdominal  pain with cough and congestion. History of COPD and hypertension. Initial encounter.  EXAM: CHEST  2 VIEW  COMPARISON:  Radiographs 11/13/2014 and 11/06/2014.  FINDINGS: The heart size and mediastinal contours are stable  with aortic atherosclerosis. The lungs are hyperinflated with emphysematous changes and chronic right middle lobe atelectasis or scarring. No superimposed airspace disease, edema or significant pleural effusion. The bones are demineralized with a stable lower thoracic compression deformity. No acute osseous findings identified.  IMPRESSION: Stable findings of chronic obstructive pulmonary disease. No acute cardiopulmonary process.   Electronically Signed   By: Carey Bullocks M.D.   On: 01/23/2015 13:21   Ct Abdomen Pelvis W Contrast  01/23/2015   CLINICAL DATA:  Abdominal pain for 3 days.  EXAM: CT ABDOMEN AND PELVIS WITH CONTRAST  TECHNIQUE: Multidetector CT imaging of the abdomen and pelvis was performed using the standard protocol following bolus administration of intravenous contrast.  CONTRAST:  50mL OMNIPAQUE IOHEXOL 300 MG/ML SOLN, 80mL OMNIPAQUE IOHEXOL 300 MG/ML SOLN  COMPARISON:  Abdominal radiograph 11/10/2014. Chest radiographs 11/13/2014.  FINDINGS: Minimal scarring is noted in the lung bases.  The gallbladder is surgically absent. No significant biliary dilatation is seen. The liver, spleen, adrenal glands, and pancreas are unremarkable. Subcentimeter low-density lesions in both kidneys are too small to fully characterize but most likely represent cysts.  There is no evidence of bowel obstruction. There is diffuse colonic diverticulosis with extensive involvement of the sigmoid colon. No definite bowel wall thickening or pericolonic inflammatory changes are seen to indicate acute diverticulitis.  Bladder is unremarkable. Uterus is absent. No pelvic mass is seen. Infrarenal abdominal aortic aneurysm measures up to 3.8 cm in diameter with prominent plaque/ thrombus within the aneurysm sac. Moderate diffuse atherosclerotic vascular calcification is present in the abdomen and pelvis. No free fluid or enlarged lymph nodes are identified. Chronic, moderate T11 compression fracture is again seen. Advanced  disc space height loss is present at L3-4 and L4-5.  IMPRESSION: 1. Extensive colonic diverticulosis. No significant inflammatory change identified to definitely indicate acute diverticulitis. 2. 3.8 cm infrarenal abdominal aortic aneurysm.   Electronically Signed   By: Sebastian Ache   On: 01/23/2015 15:30    IMPRESSION/PLAN: #1. Lower GI bleed. Likely diverticular in nature. Hemoglobin 11.8. Bleeding seems to be tapering. Monitor H&H and transfuse as needed. With poor respiratory status would not be the best candidate for colonoscopy. If bleeding recurred and became profuse may need tagged RBC scan  #2. COPD. On nebulizers.  3. GERD. Continue PPI    Hvozdovic, Moise Boring 01/24/2015,  Pager [phone]  ________________________________________________________________________  [facility] MD note:  I personally examined the patient, reviewed the data and agree with the assessment and plan described above.  She is a bit confused (year, place).  I spoke with her grandson Dorinda Hill this afternoon.  I explained that colonoscopy is best test to know for certain what has caused the bleeding but at her age, mild dementia, O2 requiring COPD it has increased risk.  He understands that possible causes include AVMs, diverticulosis, polyps, cancer.  HE prefers to observe her another 12-24 hours and only if she continues to bleed would he opt for her undergoing colonoscopy. I think that is very reasonable.  Will advance her diet, follow along.    Rob Bunting, MD [facility] Pager [phone]

## 2015-01-25 DIAGNOSIS — K625 Hemorrhage of anus and rectum: Secondary | ICD-10-CM

## 2015-01-25 DIAGNOSIS — J9621 Acute and chronic respiratory failure with hypoxia: Secondary | ICD-10-CM

## 2015-01-25 LAB — CBC
HCT: 37.8 % (ref 36.0–46.0)
Hemoglobin: 11.5 g/dL — ABNORMAL LOW (ref 12.0–15.0)
MCH: 28.7 pg (ref 26.0–34.0)
MCHC: 30.4 g/dL (ref 30.0–36.0)
MCV: 94.3 fL (ref 78.0–100.0)
Platelets: 203 10*3/uL (ref 150–400)
RBC: 4.01 MIL/uL (ref 3.87–5.11)
RDW: 14.7 % (ref 11.5–15.5)
WBC: 8.6 10*3/uL (ref 4.0–10.5)

## 2015-01-25 MED ORDER — ALBUTEROL SULFATE (2.5 MG/3ML) 0.083% IN NEBU: 2.5000 mg | INHALATION_SOLUTION | RESPIRATORY_TRACT | Status: AC | PRN

## 2015-01-25 MED ORDER — BOOST / RESOURCE BREEZE PO LIQD: 1.0000 | Freq: Three times a day (TID) | ORAL | Status: AC

## 2015-01-25 MED ORDER — HYDROCODONE-ACETAMINOPHEN 5-325 MG PO TABS: 1.0000 | ORAL_TABLET | Freq: Four times a day (QID) | ORAL | Status: DC | PRN

## 2015-01-25 MED ORDER — GUAIFENESIN 100 MG/5ML PO SYRP: 300.0000 mg | ORAL_SOLUTION | Freq: Four times a day (QID) | ORAL | Status: AC

## 2015-01-25 MED ORDER — LORAZEPAM 0.5 MG PO TABS: 0.5000 mg | ORAL_TABLET | Freq: Three times a day (TID) | ORAL | Status: DC | PRN

## 2015-01-25 NOTE — Progress Notes (Signed)
[name] [name] [place] to be D/C'd Skilled nursing facility per MD order.  Discussed prescriptions and follow up appointments with the patient. Prescriptions given to patient, medication list explained in detail. Pt verbalized understanding.    Medication List    STOP taking these medications        aspirin 81 MG chewable tablet     guaiFENesin 600 MG 12 hr tablet  Commonly known as:  MUCINEX  Replaced by:  guaifenesin 100 MG/5ML syrup      TAKE these medications        albuterol (2.5 MG/3ML) 0.083% nebulizer solution  Commonly known as:  PROVENTIL  Take 2.5 mg by nebulization 4 (four) times daily.     albuterol (2.5 MG/3ML) 0.083% nebulizer solution  Commonly known as:  PROVENTIL  Take 3 mLs (2.5 mg total) by nebulization every 2 (two) hours as needed for wheezing.     carvedilol 3.125 MG tablet  Commonly known as:  COREG  Take 1 tablet (3.125 mg total) by mouth 2 (two) times daily with a meal.     cholecalciferol 1000 UNITS tablet  Commonly known as:  VITAMIN D  Take 1,000 Units by mouth daily.     feeding supplement (RESOURCE BREEZE) Liqd  Take 1 Container by mouth 3 (three) times daily between meals.     furosemide 40 MG tablet  Commonly known as:  LASIX  Take 1 tablet (40 mg total) by mouth daily.     guaifenesin 100 MG/5ML syrup  Commonly known as:  ROBITUSSIN  Take 15 mLs (300 mg total) by mouth every 6 (six) hours.     HYDROcodone-acetaminophen 5-325 MG per tablet  Commonly known as:  NORCO/VICODIN  Take 1 tablet by mouth every 6 (six) hours as needed for moderate pain.     ipratropium 0.02 % nebulizer solution  Commonly known as:  ATROVENT  Take 0.5 mg by nebulization 4 (four) times daily.     levothyroxine 137 MCG tablet  Commonly known as:  SYNTHROID, LEVOTHROID  Take 137 mcg by mouth daily before breakfast.     lisinopril 2.5 MG tablet  Commonly known as:  PRINIVIL,ZESTRIL  Take 1 tablet (2.5 mg total) by mouth daily.     LORazepam 0.5 MG tablet   Commonly known as:  ATIVAN  Take 1 tablet (0.5 mg total) by mouth every 8 (eight) hours as needed for anxiety.     omeprazole 20 MG tablet  Commonly known as:  PRILOSEC OTC  Take 20 mg by mouth daily.     polyethylene glycol packet  Commonly known as:  MIRALAX / GLYCOLAX  Take 17 g by mouth 2 (two) times daily.     potassium chloride SA 20 MEQ tablet  Commonly known as:  K-DUR,KLOR-CON  Take 20 mEq by mouth daily.     pravastatin 40 MG tablet  Commonly known as:  PRAVACHOL  Take 40 mg by mouth daily at 6 PM.     sennosides-docusate sodium 8.6-50 MG tablet  Commonly known as:  SENOKOT-S  Take 2 tablets by mouth at bedtime.        Filed Vitals:   01/25/15 0517  BP: 120/75  Pulse: 105  Temp: 98.3 F (36.8 C)  Resp: 20    Skin clean, dry and intact without evidence of skin break down, no evidence of skin tears noted. IV catheter discontinued intact. Site without signs and symptoms of complications. Dressing and pressure applied. Pt denies pain at this time. No complaints noted.  An After Visit Summary was printed and given to the patient. Patient escorted via WC, and D/C home via private auto.  [name] 01/25/2015 1:27 PM

## 2015-01-25 NOTE — Progress Notes (Signed)
[facility] Progress Note    Since last GI note: RN reports no overt bleeding overnight.  She is sleeping currently.  I did not awaken her.  Objective: Vital signs in last 24 hours: Temp:  [97.3 F (36.3 C)-98.5 F (36.9 C)] 98.3 F (36.8 C) (06/14 0517) Pulse Rate:  [81-105] 105 (06/14 0517) Resp:  [13-28] 20 (06/14 0517) BP: (110-150)/(48-78) 120/75 mmHg (06/14 0517) SpO2:  [93 %-100 %] 93 % (06/14 0517) Last BM Date: 01/24/15 General: sleeping Heart: regular rate and rythm   Lab Results:  Recent Labs  01/24/15 0235 01/24/15 1020 01/25/15 0534  WBC 7.6 8.4 8.6  HGB 11.8* 11.6* 11.5*  PLT 180 182 203  MCV 93.6 94.3 94.3    Recent Labs  01/23/15 1211 01/24/15 0235  NA 140 140  K 4.2 3.6  CL 101 102  CO2 32 31  GLUCOSE 141* 113*  BUN 21* 22*  CREATININE 0.80 0.56  CALCIUM 9.1 8.5*    Recent Labs  01/23/15 1211  PROT 6.0*  ALBUMIN 3.5  AST 15  ALT 11*  ALKPHOS 73  BILITOT 0.7   Medications: Scheduled Meds: . cholecalciferol  1,000 Units Oral Daily  . feeding supplement (ENSURE ENLIVE)  237 mL Oral BID BM  . feeding supplement (RESOURCE BREEZE)  1 Container Oral TID BM  . furosemide  40 mg Oral Daily  . guaifenesin  300 mg Oral 4 times per day  . ipratropium-albuterol  3 mL Nebulization QID  . levothyroxine  137 mcg Oral QAC breakfast  . lisinopril  2.5 mg Oral Daily  . pantoprazole  40 mg Oral Daily  . polyethylene glycol  17 g Oral Daily  . potassium chloride SA  20 mEq Oral Daily  . pravastatin  40 mg Oral q1800  . sodium chloride  3 mL Intravenous Q12H   Continuous Infusions:  PRN Meds:.acetaminophen **OR** acetaminophen, albuterol, guaiFENesin-dextromethorphan, HYDROcodone-acetaminophen, LORazepam, morphine injection, ondansetron **OR** ondansetron (ZOFRAN) IV    Assessment/Plan: 81 y.o. female with minor lower GI bleeding, presumed diverticular  The bleeding seems to have stopped. Her Hb never decreased below 11.5 and  she has not needed blood transfusion.  I spoke with her DPOA, grandson Dorinda Hill yesterday.  He prefers to not be aggressive about testing, interventions at this point.  She is probably safe for discharge back to her NH later today or tomorrow. If she has significant rebleeding then we would plan to proceed with colonoscopy.     Rachael Fee, MD  01/25/2015, 7:39 AM [facility] Pager [phone]

## 2015-01-25 NOTE — Clinical Social Work Note (Signed)
Clinical Social Work Assessment  Patient Details  Name: [name] [name] MRN: [id number] Date of Birth: [birth year]  Date of referral:  01/25/15               Reason for consult:  Facility Placement                Permission sought to share information with:  Family Supports Permission granted to share information::  Yes, Verbal Permission Granted  Name::      (grandson- Donald)  Agency::     Relationship::     Contact Information:     Housing/Transportation Living arrangements for the past 2 months:  Skilled Building surveyor of Information:  Facility, Patient, Adult Children Patient Interpreter Needed:  None Criminal Activity/Legal Involvement Pertinent to Current Situation/Hospitalization:    Significant Relationships:  Adult Children, Merchandiser, retail Lives with:  Facility Resident Do you feel safe going back to the place where you live?  Yes Need for family participation in patient care:  Yes (Comment)  Care giving concerns:  Patient ready for return to SNF today   Social Worker assessment / plan:  CSW advised in morning rounds that patient was admitted from [facility]- CSW spoke with grandson and SNF rep and have confirmed plans for her return today (bed hold per SNF).  Employment status:  Retired Health and safety inspector:  Medicare PT Recommendations:  Skilled Nursing Facility Information / Referral to community resources:     Patient/Family's Response to care:  Family very supportive and involved- they are pleased she can return and want to make sure she gets lunch and "her hot strong black coffee" prior to transfer.   Patient/Family's Understanding of and Emotional Response to Diagnosis, Current Treatment, and Prognosis:  Family understands treatment plan and plans going forward back to SNF-  Emotional Assessment Appearance:    Attitude/Demeanor/Rapport:    Affect (typically observed):    Orientation:  Oriented to Self Alcohol / Substance use:  Not  Applicable Psych involvement (Current and /or in the community):  No (Comment)  Discharge Needs  Concerns to be addressed:  No discharge needs identified Readmission within the last 30 days:    Current discharge risk:    Barriers to Discharge:  No Barriers Identified   Liliana Cline, LCSW 01/25/2015, 11:07 AM

## 2015-01-25 NOTE — Discharge Summary (Addendum)
PATIENT DETAILS Name: [name] [name] Age: 81 y.o. Sex: female Date of Birth: 16-Aug-1928 MRN: [id number]. Admitting Physician: Maretta Bees, MD GYB:[id number] Hessie Diener, MD  Admit Date: 01/23/2015 Discharge date: 01/25/2015  Recommendations for Outpatient Follow-up:  1. Please repeat CBC in 1 week  2. Have discontinued aspirin for now-if needed to be resumed-suggest resumption in 2 weeks. 3. Suggest palliative care follow-up while at SNF.   PRIMARY DISCHARGE DIAGNOSIS:  Active Problems:   Essential hypertension, benign   Hypothyroidism   GERD (gastroesophageal reflux disease)   Chronic respiratory failure with hypoxia   COPD with exacerbation   HLD (hyperlipidemia)   Lower GI bleed   GI bleed   Rectal bleeding      PAST MEDICAL HISTORY: Past Medical History  Diagnosis Date  . Hypertension   . Hyperlipidemia   . Thyroid disease   . Hypokalemia   . Vitamin D deficiency   . COPD (chronic obstructive pulmonary disease)   . CHF (congestive heart failure)   . GERD (gastroesophageal reflux disease)   . CKD (chronic kidney disease), stage III     DISCHARGE MEDICATIONS: Current Discharge Medication List    START taking these medications   Details  !! albuterol (PROVENTIL) (2.5 MG/3ML) 0.083% nebulizer solution Take 3 mLs (2.5 mg total) by nebulization every 2 (two) hours as needed for wheezing.    guaifenesin (ROBITUSSIN) 100 MG/5ML syrup Take 15 mLs (300 mg total) by mouth every 6 (six) hours. Qty: 120 mL, Refills: 0     !! - Potential duplicate medications found. Please discuss with provider.    CONTINUE these medications which have CHANGED   Details  feeding supplement, RESOURCE BREEZE, (RESOURCE BREEZE) LIQD Take 1 Container by mouth 3 (three) times daily between meals. Refills: 0    HYDROcodone-acetaminophen (NORCO/VICODIN) 5-325 MG per tablet Take 1 tablet by mouth every 6 (six) hours as needed for moderate pain. Qty: 10 tablet, Refills: 0      LORazepam (ATIVAN) 0.5 MG tablet Take 1 tablet (0.5 mg total) by mouth every 8 (eight) hours as needed for anxiety. Qty: 15 tablet, Refills: 0      CONTINUE these medications which have NOT CHANGED   Details  !! albuterol (PROVENTIL) (2.5 MG/3ML) 0.083% nebulizer solution Take 2.5 mg by nebulization 4 (four) times daily.    carvedilol (COREG) 3.125 MG tablet Take 1 tablet (3.125 mg total) by mouth 2 (two) times daily with a meal. Qty: 30 tablet, Refills: 0    cholecalciferol (VITAMIN D) 1000 UNITS tablet Take 1,000 Units by mouth daily.    furosemide (LASIX) 40 MG tablet Take 1 tablet (40 mg total) by mouth daily. Qty: 30 tablet, Refills: 0    ipratropium (ATROVENT) 0.02 % nebulizer solution Take 0.5 mg by nebulization 4 (four) times daily.    levothyroxine (SYNTHROID, LEVOTHROID) 137 MCG tablet Take 137 mcg by mouth daily before breakfast.    lisinopril (PRINIVIL,ZESTRIL) 2.5 MG tablet Take 1 tablet (2.5 mg total) by mouth daily. Qty: 30 tablet, Refills: 1    omeprazole (PRILOSEC OTC) 20 MG tablet Take 20 mg by mouth daily.    polyethylene glycol (MIRALAX / GLYCOLAX) packet Take 17 g by mouth 2 (two) times daily. Qty: 14 each, Refills: 0    potassium chloride SA (K-DUR,KLOR-CON) 20 MEQ tablet Take 20 mEq by mouth daily.    pravastatin (PRAVACHOL) 40 MG tablet Take 40 mg by mouth daily at 6 PM.     sennosides-docusate sodium (SENOKOT-S) 8.6-50 MG tablet  Take 2 tablets by mouth at bedtime.     !! - Potential duplicate medications found. Please discuss with provider.    STOP taking these medications     aspirin 81 MG chewable tablet      guaiFENesin (MUCINEX) 600 MG 12 hr tablet         ALLERGIES:  No Known Allergies  BRIEF HPI:  See H&P, Labs, Consult and Test reports for all details in brief, patient is a 81 year old female with history of COPD on home O2 admitted for evaluation of hematochezia.  CONSULTATIONS:   GI  PERTINENT RADIOLOGIC STUDIES: Dg Chest 2  View  01/23/2015   CLINICAL DATA:  Rectal bleeding today. Abdominal pain with cough and congestion. History of COPD and hypertension. Initial encounter.  EXAM: CHEST  2 VIEW  COMPARISON:  Radiographs 11/13/2014 and 11/06/2014.  FINDINGS: The heart size and mediastinal contours are stable with aortic atherosclerosis. The lungs are hyperinflated with emphysematous changes and chronic right middle lobe atelectasis or scarring. No superimposed airspace disease, edema or significant pleural effusion. The bones are demineralized with a stable lower thoracic compression deformity. No acute osseous findings identified.  IMPRESSION: Stable findings of chronic obstructive pulmonary disease. No acute cardiopulmonary process.   Electronically Signed   By: Carey Bullocks M.D.   On: 01/23/2015 13:21   Ct Abdomen Pelvis W Contrast  01/23/2015   CLINICAL DATA:  Abdominal pain for 3 days.  EXAM: CT ABDOMEN AND PELVIS WITH CONTRAST  TECHNIQUE: Multidetector CT imaging of the abdomen and pelvis was performed using the standard protocol following bolus administration of intravenous contrast.  CONTRAST:  50mL OMNIPAQUE IOHEXOL 300 MG/ML SOLN, 80mL OMNIPAQUE IOHEXOL 300 MG/ML SOLN  COMPARISON:  Abdominal radiograph 11/10/2014. Chest radiographs 11/13/2014.  FINDINGS: Minimal scarring is noted in the lung bases.  The gallbladder is surgically absent. No significant biliary dilatation is seen. The liver, spleen, adrenal glands, and pancreas are unremarkable. Subcentimeter low-density lesions in both kidneys are too small to fully characterize but most likely represent cysts.  There is no evidence of bowel obstruction. There is diffuse colonic diverticulosis with extensive involvement of the sigmoid colon. No definite bowel wall thickening or pericolonic inflammatory changes are seen to indicate acute diverticulitis.  Bladder is unremarkable. Uterus is absent. No pelvic mass is seen. Infrarenal abdominal aortic aneurysm measures up to  3.8 cm in diameter with prominent plaque/ thrombus within the aneurysm sac. Moderate diffuse atherosclerotic vascular calcification is present in the abdomen and pelvis. No free fluid or enlarged lymph nodes are identified. Chronic, moderate T11 compression fracture is again seen. Advanced disc space height loss is present at L3-4 and L4-5.  IMPRESSION: 1. Extensive colonic diverticulosis. No significant inflammatory change identified to definitely indicate acute diverticulitis. 2. 3.8 cm infrarenal abdominal aortic aneurysm.   Electronically Signed   By: Sebastian Ache   On: 01/23/2015 15:30     PERTINENT LAB RESULTS: CBC:  Recent Labs  01/24/15 1020 01/25/15 0534  WBC 8.4 8.6  HGB 11.6* 11.5*  HCT 37.7 37.8  PLT 182 203   CMET CMP     Component Value Date/Time   NA 140 01/24/2015 0235   K 3.6 01/24/2015 0235   CL 102 01/24/2015 0235   CO2 31 01/24/2015 0235   GLUCOSE 113* 01/24/2015 0235   BUN 22* 01/24/2015 0235   CREATININE 0.56 01/24/2015 0235   CALCIUM 8.5* 01/24/2015 0235   PROT 6.0* 01/23/2015 1211   ALBUMIN 3.5 01/23/2015 1211   AST 15  01/23/2015 1211   ALT 11* 01/23/2015 1211   ALKPHOS 73 01/23/2015 1211   BILITOT 0.7 01/23/2015 1211   GFRNONAA >60 01/24/2015 0235   GFRAA >60 01/24/2015 0235    GFR Estimated Creatinine Clearance: 45.2 mL/min (by C-G formula based on Cr of 0.56).  Recent Labs  01/23/15 1211  LIPASE 18*   No results for input(s): CKTOTAL, CKMB, CKMBINDEX, TROPONINI in the last 72 hours. Invalid input(s): POCBNP No results for input(s): DDIMER in the last 72 hours. No results for input(s): HGBA1C in the last 72 hours. No results for input(s): CHOL, HDL, LDLCALC, TRIG, CHOLHDL, LDLDIRECT in the last 72 hours. No results for input(s): TSH, T4TOTAL, T3FREE, THYROIDAB in the last 72 hours.  Invalid input(s): FREET3 No results for input(s): VITAMINB12, FOLATE, FERRITIN, TIBC, IRON, RETICCTPCT in the last 72 hours. Coags: No results for  input(s): INR in the last 72 hours.  Invalid input(s): PT Microbiology: Recent Results (from the past 240 hour(s))  MRSA PCR Screening     Status: None   Collection Time: 01/23/15  6:22 PM  Result Value Ref Range Status   MRSA by PCR NEGATIVE NEGATIVE Final    Comment:        The GeneXpert MRSA Assay (FDA approved for NASAL specimens only), is one component of a comprehensive MRSA colonization surveillance program. It is not intended to diagnose MRSA infection nor to guide or monitor treatment for MRSA infections.      BRIEF HOSPITAL COURSE:  Lower GI bleed:Resolved, with just supportive measures. Did not require any PRBC transfusion. Hemoglobin remained stable, hemoglobin at time of discharge at 11.5. GI was consulted during this hospital stay, no further recommendations at this time. Since has not had any hematochezia for close to 2 days now, suspect stable for discharge.  Mild acute blood loss anemia:secondary to above. Did not require transfusion, Follow CBC periodically at SNF.  COPD exacerbation:much improved. Moving air-claims breathing at baseline. Continue Nebs on discharge  Hypothyroidism: Continue with levothyroxine   HLD (hyperlipidemia): Continue with statins  GERD (gastroesophageal reflux disease): Continue with PPI   Essential hypertension, benign:anti hypertensives held on admission-however both Lasix and is in up and have been resumed.   Chronic respiratory failure with hypoxia: On home O2-continue  TODAY-DAY OF DISCHARGE:  Subjective:   [name] [place] today has no headache,no chest abdominal pain,no new weakness tingling or numbness. No hematochezia overnight as well  Objective:   Blood pressure 120/75, pulse 105, temperature 98.3 F (36.8 C), temperature source Oral, resp. rate 20, height  (1.626 m), weight 57.6 kg (126 lb 15.8 oz), SpO2 95 %.  Intake/Output Summary (Last 24 hours) at 01/25/15 0919 Last data filed at 01/24/15  1400  Gross per 24 hour  Intake    240 ml  Output      0 ml  Net    240 ml   Filed Weights   01/23/15 1825  Weight: 57.6 kg (126 lb 15.8 oz)    Exam Awake Alert, Oriented *3, No new F.N deficits, Normal affect [place].AT,PERRAL Supple Neck,No JVD, No cervical lymphadenopathy appriciated.  Symmetrical Chest wall movement, Good air movement bilaterally, CTAB RRR,No Gallops,Rubs or new Murmurs, No Parasternal Heave +ve B.Sounds, Abd Soft, Non tender, No organomegaly appriciated, No rebound -guarding or rigidity. No Cyanosis, Clubbing or edema, No new Rash or bruise  DISCHARGE CONDITION: Stable  DISPOSITION: SNF  CODE STATUS: DNR  DISCHARGE INSTRUCTIONS:    Activity:  As tolerated with Full fall precautions use  walker/cane & assistance as needed  Diet recommendation: Heart Healthy diet Aspiration precautions:yes  Discharge Instructions    Call MD for:  difficulty breathing, headache or visual disturbances    Complete by:  As directed      Call MD for:    Complete by:  As directed   Recurrent hematochezia     Diet - low sodium heart healthy    Complete by:  As directed      Increase activity slowly    Complete by:  As directed            Follow-up Information    Follow up with Julian Hy, MD. Schedule an appointment as soon as possible for a visit in 1 week.   Specialty:  Endocrinology   Contact information:   [address] [place] [address] [phone]       Total Time spent on discharge equals  45 minutes.  SignedJeoffrey Massed 01/25/2015 9:19 AM

## 2015-01-25 NOTE — Progress Notes (Signed)
Patient for d/c back today to SNF bed at [facility]. Family and patient agreeable to this plan- plan transfer via EMS. Reece Levy, MSW, Theresia Majors [phone]

## 2015-03-01 ENCOUNTER — Encounter (HOSPITAL_COMMUNITY): Payer: Self-pay | Admitting: *Deleted

## 2015-03-01 ENCOUNTER — Inpatient Hospital Stay (HOSPITAL_COMMUNITY)
Admission: EM | Admit: 2015-03-01 | Discharge: 2015-03-06 | DRG: 193 | Disposition: A | Discharge status: Skilled Nursing Facility | Payer: Medicare Other | Attending: Internal Medicine | Admitting: Internal Medicine

## 2015-03-01 ENCOUNTER — Emergency Department (HOSPITAL_COMMUNITY): Payer: Medicare Other

## 2015-03-01 DIAGNOSIS — J189 Pneumonia, unspecified organism: Principal | ICD-10-CM | POA: Diagnosis present

## 2015-03-01 DIAGNOSIS — E538 Deficiency of other specified B group vitamins: Secondary | ICD-10-CM | POA: Diagnosis present

## 2015-03-01 DIAGNOSIS — R0602 Shortness of breath: Secondary | ICD-10-CM | POA: Diagnosis not present

## 2015-03-01 DIAGNOSIS — I10 Essential (primary) hypertension: Secondary | ICD-10-CM | POA: Diagnosis present

## 2015-03-01 DIAGNOSIS — Y95 Nosocomial condition: Secondary | ICD-10-CM | POA: Diagnosis present

## 2015-03-01 DIAGNOSIS — J9602 Acute respiratory failure with hypercapnia: Secondary | ICD-10-CM | POA: Diagnosis present

## 2015-03-01 DIAGNOSIS — E039 Hypothyroidism, unspecified: Secondary | ICD-10-CM | POA: Diagnosis present

## 2015-03-01 DIAGNOSIS — Z87891 Personal history of nicotine dependence: Secondary | ICD-10-CM

## 2015-03-01 DIAGNOSIS — J440 Chronic obstructive pulmonary disease with acute lower respiratory infection: Secondary | ICD-10-CM | POA: Diagnosis present

## 2015-03-01 DIAGNOSIS — I5032 Chronic diastolic (congestive) heart failure: Secondary | ICD-10-CM | POA: Diagnosis present

## 2015-03-01 DIAGNOSIS — Z66 Do not resuscitate: Secondary | ICD-10-CM | POA: Diagnosis present

## 2015-03-01 DIAGNOSIS — G9340 Encephalopathy, unspecified: Secondary | ICD-10-CM | POA: Diagnosis present

## 2015-03-01 DIAGNOSIS — K219 Gastro-esophageal reflux disease without esophagitis: Secondary | ICD-10-CM | POA: Diagnosis present

## 2015-03-01 DIAGNOSIS — E785 Hyperlipidemia, unspecified: Secondary | ICD-10-CM | POA: Diagnosis present

## 2015-03-01 DIAGNOSIS — J9601 Acute respiratory failure with hypoxia: Secondary | ICD-10-CM | POA: Diagnosis present

## 2015-03-01 DIAGNOSIS — R0902 Hypoxemia: Secondary | ICD-10-CM

## 2015-03-01 DIAGNOSIS — I5033 Acute on chronic diastolic (congestive) heart failure: Secondary | ICD-10-CM | POA: Diagnosis present

## 2015-03-01 DIAGNOSIS — I129 Hypertensive chronic kidney disease with stage 1 through stage 4 chronic kidney disease, or unspecified chronic kidney disease: Secondary | ICD-10-CM | POA: Diagnosis present

## 2015-03-01 DIAGNOSIS — E559 Vitamin D deficiency, unspecified: Secondary | ICD-10-CM | POA: Diagnosis present

## 2015-03-01 DIAGNOSIS — Z515 Encounter for palliative care: Secondary | ICD-10-CM

## 2015-03-01 DIAGNOSIS — N183 Chronic kidney disease, stage 3 (moderate): Secondary | ICD-10-CM | POA: Diagnosis present

## 2015-03-01 DIAGNOSIS — E872 Acidosis: Secondary | ICD-10-CM | POA: Diagnosis present

## 2015-03-01 NOTE — ED Notes (Signed)
Pt arrives from [name] SN[facility] c/o sob and fever, non productive cough. Facility gave tylenol prior to arrival.

## 2015-03-01 NOTE — ED Provider Notes (Signed)
CSN: [id number]     Arrival date & time 03/01/15  2342 History  This chart was scribed for Marisa Severin, MD by Abel Presto, ED Scribe. This patient was seen in room A03C/A03C and the patient's care was started at 11:44 PM.    Chief Complaint  Patient presents with  . Shortness of Breath   LEVEL 5 CAVEAT  The history is provided by the patient, the EMS personnel and medical records. The history is limited by the condition of the patient. No language interpreter was used.   HPI Comments: [name] [name] is a 81 y.o. female brought in by ambulance, with PMHx of HTN, HLD, thyroid disease, COPD, CHF, GERD, and CKD who presents to the Emergency Department complaining of SOB with onset this evening. Pt here from [place], presented today to staff with associated fever at 102 and some AMS. Pt c/o weakness and non productive cough. Pt was given Tylenol for relief around 10 PM. Pt is on 2 L O2 at baseline. Baseline mental status is AxO x4. Pt sleeping in exam room but responding to questions and obeying commands when prompted. ROS limited due to condition of pt.   Past Medical History  Diagnosis Date  . Hypertension   . Hyperlipidemia   . Thyroid disease   . Hypokalemia   . Vitamin D deficiency   . COPD (chronic obstructive pulmonary disease)   . CHF (congestive heart failure)   . GERD (gastroesophageal reflux disease)   . CKD (chronic kidney disease), stage III    Past Surgical History  Procedure Laterality Date  . Cholecystectomy    . Abdominal hysterectomy     Family History  Problem Relation Age of Onset  . Heart attack Mother   . Heart attack Brother    History  Substance Use Topics  . Smoking status: Former Smoker    Types: Cigarettes  . Smokeless tobacco: Never Used     Comment: quit smoking years ago  . Alcohol Use: No   OB History    No data available     Review of Systems  Constitutional: Positive for fever.  Respiratory: Positive for cough and shortness  of breath.   Cardiovascular: Negative for chest pain.  Neurological: Positive for weakness.      Allergies  Review of patient's allergies indicates no known allergies.  Home Medications   Prior to Admission medications   Medication Sig Start Date End Date Taking? Authorizing Provider  albuterol (PROVENTIL) (2.5 MG/3ML) 0.083% nebulizer solution Take 2.5 mg by nebulization 4 (four) times daily.    Historical Provider, MD  albuterol (PROVENTIL) (2.5 MG/3ML) 0.083% nebulizer solution Take 3 mLs (2.5 mg total) by nebulization every 2 (two) hours as needed for wheezing. 01/25/15   Shanker Levora Dredge, MD  carvedilol (COREG) 3.125 MG tablet Take 1 tablet (3.125 mg total) by mouth 2 (two) times daily with a meal. 09/24/14   Leroy Sea, MD  cholecalciferol (VITAMIN D) 1000 UNITS tablet Take 1,000 Units by mouth daily.    Historical Provider, MD  feeding supplement, RESOURCE BREEZE, (RESOURCE BREEZE) LIQD Take 1 Container by mouth 3 (three) times daily between meals. 01/25/15   Shanker Levora Dredge, MD  furosemide (LASIX) 40 MG tablet Take 1 tablet (40 mg total) by mouth daily. 09/25/14   Leroy Sea, MD  guaifenesin (ROBITUSSIN) 100 MG/5ML syrup Take 15 mLs (300 mg total) by mouth every 6 (six) hours. 01/25/15   Shanker Levora Dredge, MD  HYDROcodone-acetaminophen (NORCO/VICODIN) 5-325 MG  per tablet Take 1 tablet by mouth every 6 (six) hours as needed for moderate pain. 01/25/15   Shanker Levora DredgeM Ghimire, MD  ipratropium (ATROVENT) 0.02 % nebulizer solution Take 0.5 mg by nebulization 4 (four) times daily.    Historical Provider, MD  levothyroxine (SYNTHROID, LEVOTHROID) 137 MCG tablet Take 137 mcg by mouth daily before breakfast.    Historical Provider, MD  lisinopril (PRINIVIL,ZESTRIL) 2.5 MG tablet Take 1 tablet (2.5 mg total) by mouth daily. 05/18/14   Hollice EspySendil K Krishnan, MD  LORazepam (ATIVAN) 0.5 MG tablet Take 1 tablet (0.5 mg total) by mouth every 8 (eight) hours as needed for anxiety. 01/25/15   Shanker  Levora DredgeM Ghimire, MD  omeprazole (PRILOSEC OTC) 20 MG tablet Take 20 mg by mouth daily.    Historical Provider, MD  polyethylene glycol (MIRALAX / GLYCOLAX) packet Take 17 g by mouth 2 (two) times daily. 09/25/14   Leroy SeaPrashant K Singh, MD  potassium chloride SA (K-DUR,KLOR-CON) 20 MEQ tablet Take 20 mEq by mouth daily.    Historical Provider, MD  pravastatin (PRAVACHOL) 40 MG tablet Take 40 mg by mouth daily at 6 PM.     Historical Provider, MD  sennosides-docusate sodium (SENOKOT-S) 8.6-50 MG tablet Take 2 tablets by mouth at bedtime.    Historical Provider, MD   SpO2 94% Physical Exam  Constitutional: She is oriented to person, place, and time. She appears well-developed and well-nourished.  Ill appearing  HENT:  Head: Normocephalic and atraumatic.  Nose: Nose normal.  Mouth/Throat: Oropharynx is clear and moist.  Eyes: Conjunctivae and EOM are normal. Pupils are equal, round, and reactive to light.  Neck: Normal range of motion. Neck supple. No JVD present. No tracheal deviation present. No thyromegaly present.  Cardiovascular: Regular rhythm, normal heart sounds and intact distal pulses.  Exam reveals no gallop and no friction rub.   No murmur heard. tachycardia  Pulmonary/Chest: Effort normal and breath sounds normal. No stridor. No respiratory distress. She has no wheezes. She has no rales. She exhibits no tenderness.  Rhonchi in left lower lung fields, loud wet cough  Abdominal: Soft. Bowel sounds are normal. She exhibits no distension and no mass. There is no tenderness. There is no rebound and no guarding.  Musculoskeletal: Normal range of motion. She exhibits no edema or tenderness.  Lymphadenopathy:    She has no cervical adenopathy.  Neurological: She is alert and oriented to person, place, and time. She displays normal reflexes. She exhibits normal muscle tone. Coordination normal.  Skin: Skin is warm and dry. No rash noted. No erythema. No pallor.  Nursing note and vitals  reviewed.   ED Course  Procedures (including critical care time) DIAGNOSTIC STUDIES: Oxygen Saturation is 94% on room air, adequate by my interpretation.    COORDINATION OF CARE: 11:52 PM Discussed treatment plan with patient at beside, the patient agrees with the plan and has no further questions at this time.   Labs Review Labs Reviewed  CBC WITH DIFFERENTIAL/PLATELET - Abnormal; Notable for the following:    Neutrophils Relative % 85 (*)    Neutro Abs 8.8 (*)    Lymphocytes Relative 8 (*)    All other components within normal limits  BASIC METABOLIC PANEL - Abnormal; Notable for the following:    Chloride 100 (*)    CO2 33 (*)    Glucose, Bld 128 (*)    BUN 23 (*)    All other components within normal limits  URINALYSIS, ROUTINE W REFLEX MICROSCOPIC (NOT AT  [facility]) - Abnormal; Notable for the following:    APPearance CLOUDY (*)    Bilirubin Urine SMALL (*)    Protein, ur 30 (*)    All other components within normal limits  BRAIN NATRIURETIC PEPTIDE - Abnormal; Notable for the following:    B Natriuretic Peptide 274.1 (*)    All other components within normal limits  URINE MICROSCOPIC-ADD ON - Abnormal; Notable for the following:    Squamous Epithelial / LPF FEW (*)    Bacteria, UA FEW (*)    Crystals CA OXALATE CRYSTALS (*)    All other components within normal limits  I-STAT ARTERIAL BLOOD GAS, ED - Abnormal; Notable for the following:    pH, Arterial 7.197 (*)    pCO2 arterial 96.4 (*)    pO2, Arterial 237.0 (*)    Bicarbonate 37.4 (*)    Acid-Base Excess 7.0 (*)    All other components within normal limits  CULTURE, BLOOD (ROUTINE X 2)  CULTURE, BLOOD (ROUTINE X 2)  URINE CULTURE  BLOOD GAS, ARTERIAL  I-STAT CG4 LACTIC ACID, ED    Imaging Review Dg Chest Port 1 View  03/02/2015   CLINICAL DATA:  81 year old female with shortness of breath. Concern for pneumonia.  EXAM: PORTABLE CHEST - 1 VIEW  COMPARISON:  Radiograph dated 01/23/2015  FINDINGS: The patient is  mildly rotated to the left. Single-view of the chest demonstrates emphysematous changes of the lungs. No focal consolidation, pleural effusion, or pneumothorax. Stable cardiac silhouette. Degenerative changes of the spine.  IMPRESSION: Emphysema.  No focal consolidation or pneumothorax.   Electronically Signed   By: Elgie Collard M.D.   On: 03/02/2015 00:56     EKG Interpretation None      ED ECG REPORT   Date: 03/02/2015  Rate:106  Rhythm: sinus tachycardia  QRS Axis: normal  Intervals: normal  ST/T Wave abnormalities: nonspecific ST changes  Conduction Disutrbances:right bundle branch block  Narrative Interpretation:   Old EKG Reviewed: unchanged  I have personally reviewed the EKG tracing and agree with the computerized printout as noted.   MDM   Final diagnoses:  HCAP (healthcare-associated pneumonia)    I personally performed the services described in this documentation, which was scribed in my presence. The recorded information has been reviewed and is accurate.  81 year old female who presents with fever, cough, shortness of breath.  Patient with productive cough.  Upon arrival, decreased mental status from baseline.  Per EMS.  Patient reports feeling "lousy".  Concern for pneumonia with rhonchi in left lower base.  Plan for blood cultures, labs, chest x-ray.  EKG was sinus tachycardia without acute ST changes.  Patient with increased oxygen requirement.  Expect she will be admitted to the hospital with hospital-acquired pneumonia.   Marisa Severin, MD 03/02/15 0330

## 2015-03-02 DIAGNOSIS — I129 Hypertensive chronic kidney disease with stage 1 through stage 4 chronic kidney disease, or unspecified chronic kidney disease: Secondary | ICD-10-CM | POA: Diagnosis present

## 2015-03-02 DIAGNOSIS — J9602 Acute respiratory failure with hypercapnia: Secondary | ICD-10-CM | POA: Diagnosis present

## 2015-03-02 DIAGNOSIS — J9601 Acute respiratory failure with hypoxia: Secondary | ICD-10-CM | POA: Diagnosis present

## 2015-03-02 DIAGNOSIS — E872 Acidosis: Secondary | ICD-10-CM | POA: Diagnosis present

## 2015-03-02 DIAGNOSIS — E038 Other specified hypothyroidism: Secondary | ICD-10-CM

## 2015-03-02 DIAGNOSIS — Z515 Encounter for palliative care: Secondary | ICD-10-CM | POA: Diagnosis not present

## 2015-03-02 DIAGNOSIS — Z66 Do not resuscitate: Secondary | ICD-10-CM | POA: Diagnosis present

## 2015-03-02 DIAGNOSIS — I5033 Acute on chronic diastolic (congestive) heart failure: Secondary | ICD-10-CM | POA: Diagnosis present

## 2015-03-02 DIAGNOSIS — I10 Essential (primary) hypertension: Secondary | ICD-10-CM | POA: Diagnosis not present

## 2015-03-02 DIAGNOSIS — J449 Chronic obstructive pulmonary disease, unspecified: Secondary | ICD-10-CM | POA: Diagnosis present

## 2015-03-02 DIAGNOSIS — G9340 Encephalopathy, unspecified: Secondary | ICD-10-CM | POA: Diagnosis present

## 2015-03-02 DIAGNOSIS — R0602 Shortness of breath: Secondary | ICD-10-CM | POA: Diagnosis present

## 2015-03-02 DIAGNOSIS — J189 Pneumonia, unspecified organism: Secondary | ICD-10-CM | POA: Diagnosis present

## 2015-03-02 DIAGNOSIS — E785 Hyperlipidemia, unspecified: Secondary | ICD-10-CM | POA: Diagnosis not present

## 2015-03-02 DIAGNOSIS — I5032 Chronic diastolic (congestive) heart failure: Secondary | ICD-10-CM | POA: Diagnosis not present

## 2015-03-02 DIAGNOSIS — Y95 Nosocomial condition: Secondary | ICD-10-CM | POA: Diagnosis present

## 2015-03-02 DIAGNOSIS — E039 Hypothyroidism, unspecified: Secondary | ICD-10-CM | POA: Diagnosis present

## 2015-03-02 DIAGNOSIS — K219 Gastro-esophageal reflux disease without esophagitis: Secondary | ICD-10-CM | POA: Diagnosis present

## 2015-03-02 DIAGNOSIS — Z87891 Personal history of nicotine dependence: Secondary | ICD-10-CM | POA: Diagnosis not present

## 2015-03-02 DIAGNOSIS — E538 Deficiency of other specified B group vitamins: Secondary | ICD-10-CM | POA: Diagnosis present

## 2015-03-02 DIAGNOSIS — E559 Vitamin D deficiency, unspecified: Secondary | ICD-10-CM | POA: Diagnosis present

## 2015-03-02 DIAGNOSIS — N183 Chronic kidney disease, stage 3 (moderate): Secondary | ICD-10-CM | POA: Diagnosis present

## 2015-03-02 LAB — I-STAT ARTERIAL BLOOD GAS, ED
Acid-Base Excess: 7 mmol/L — ABNORMAL HIGH (ref 0.0–2.0)
BICARBONATE: 37.4 meq/L — AB (ref 20.0–24.0)
O2 Saturation: 100 %
TCO2: 40 mmol/L (ref 0–100)
pCO2 arterial: 96.4 mmHg (ref 35.0–45.0)
pH, Arterial: 7.197 — CL (ref 7.350–7.450)
pO2, Arterial: 237 mmHg — ABNORMAL HIGH (ref 80.0–100.0)

## 2015-03-02 LAB — URINE MICROSCOPIC-ADD ON

## 2015-03-02 LAB — CBC WITH DIFFERENTIAL/PLATELET
Basophils Absolute: 0 10*3/uL (ref 0.0–0.1)
Basophils Absolute: 0.1 10*3/uL (ref 0.0–0.1)
Basophils Relative: 0 % (ref 0–1)
Basophils Relative: 1 % (ref 0–1)
EOS ABS: 0 10*3/uL (ref 0.0–0.7)
EOS PCT: 1 % (ref 0–5)
Eosinophils Absolute: 0.1 10*3/uL (ref 0.0–0.7)
Eosinophils Relative: 0 % (ref 0–5)
HCT: 37.9 % (ref 36.0–46.0)
HCT: 41.8 % (ref 36.0–46.0)
HEMOGLOBIN: 11.2 g/dL — AB (ref 12.0–15.0)
HEMOGLOBIN: 12.6 g/dL (ref 12.0–15.0)
LYMPHS ABS: 0.8 10*3/uL (ref 0.7–4.0)
LYMPHS PCT: 8 % — AB (ref 12–46)
Lymphocytes Relative: 13 % (ref 12–46)
Lymphs Abs: 1.3 10*3/uL (ref 0.7–4.0)
MCH: 27.4 pg (ref 26.0–34.0)
MCH: 27.6 pg (ref 26.0–34.0)
MCHC: 29.6 g/dL — ABNORMAL LOW (ref 30.0–36.0)
MCHC: 30.1 g/dL (ref 30.0–36.0)
MCV: 91.7 fL (ref 78.0–100.0)
MCV: 92.7 fL (ref 78.0–100.0)
MONO ABS: 0.8 10*3/uL (ref 0.1–1.0)
Monocytes Absolute: 1.1 10*3/uL — ABNORMAL HIGH (ref 0.1–1.0)
Monocytes Relative: 11 % (ref 3–12)
Monocytes Relative: 7 % (ref 3–12)
Neutro Abs: 7.3 10*3/uL (ref 1.7–7.7)
Neutro Abs: 8.8 10*3/uL — ABNORMAL HIGH (ref 1.7–7.7)
Neutrophils Relative %: 74 % (ref 43–77)
Neutrophils Relative %: 85 % — ABNORMAL HIGH (ref 43–77)
Platelets: 282 10*3/uL (ref 150–400)
Platelets: 298 10*3/uL (ref 150–400)
RBC: 4.09 MIL/uL (ref 3.87–5.11)
RBC: 4.56 MIL/uL (ref 3.87–5.11)
RDW: 14.6 % (ref 11.5–15.5)
RDW: 14.7 % (ref 11.5–15.5)
WBC: 10.5 10*3/uL (ref 4.0–10.5)
WBC: 9.8 10*3/uL (ref 4.0–10.5)

## 2015-03-02 LAB — BASIC METABOLIC PANEL
Anion gap: 11 (ref 5–15)
BUN: 23 mg/dL — AB (ref 6–20)
CO2: 33 mmol/L — AB (ref 22–32)
Calcium: 9.4 mg/dL (ref 8.9–10.3)
Chloride: 100 mmol/L — ABNORMAL LOW (ref 101–111)
Creatinine, Ser: 0.8 mg/dL (ref 0.44–1.00)
GLUCOSE: 128 mg/dL — AB (ref 65–99)
Potassium: 4.3 mmol/L (ref 3.5–5.1)
SODIUM: 144 mmol/L (ref 135–145)

## 2015-03-02 LAB — GLUCOSE, CAPILLARY
GLUCOSE-CAPILLARY: 96 mg/dL (ref 65–99)
GLUCOSE-CAPILLARY: 143 mg/dL — AB (ref 65–99)
GLUCOSE-CAPILLARY: 133 mg/dL — AB (ref 65–99)
Glucose-Capillary: 135 mg/dL — ABNORMAL HIGH (ref 65–99)

## 2015-03-02 LAB — POCT I-STAT 3, ART BLOOD GAS (G3+)
Acid-Base Excess: 3 mmol/L — ABNORMAL HIGH (ref 0.0–2.0)
Bicarbonate: 31.9 meq/L — ABNORMAL HIGH (ref 20.0–24.0)
O2 Saturation: 94 %
Patient temperature: 98.6
TCO2: 34 mmol/L (ref 0–100)
pCO2 arterial: 68.9 mmHg (ref 35.0–45.0)
pH, Arterial: 7.273 — ABNORMAL LOW (ref 7.350–7.450)
pO2, Arterial: 85 mmHg (ref 80.0–100.0)

## 2015-03-02 LAB — URINALYSIS, ROUTINE W REFLEX MICROSCOPIC
GLUCOSE, UA: NEGATIVE mg/dL
Hgb urine dipstick: NEGATIVE
KETONES UR: NEGATIVE mg/dL
Leukocytes, UA: NEGATIVE
NITRITE: NEGATIVE
PROTEIN: 30 mg/dL — AB
Specific Gravity, Urine: 1.026 (ref 1.005–1.030)
Urobilinogen, UA: 1 mg/dL (ref 0.0–1.0)
pH: 5 (ref 5.0–8.0)

## 2015-03-02 LAB — COMPREHENSIVE METABOLIC PANEL
ALT: 11 U/L — ABNORMAL LOW (ref 14–54)
ANION GAP: 6 (ref 5–15)
AST: 17 U/L (ref 15–41)
Albumin: 2.8 g/dL — ABNORMAL LOW (ref 3.5–5.0)
Alkaline Phosphatase: 70 U/L (ref 38–126)
BUN: 21 mg/dL — AB (ref 6–20)
CHLORIDE: 104 mmol/L (ref 101–111)
CO2: 32 mmol/L (ref 22–32)
CREATININE: 0.81 mg/dL (ref 0.44–1.00)
Calcium: 8.5 mg/dL — ABNORMAL LOW (ref 8.9–10.3)
GFR calc Af Amer: >60 mL/min (ref >60)
GFR calc non Af Amer: >60 mL/min (ref >60)
GLUCOSE: 130 mg/dL — AB (ref 65–99)
POTASSIUM: 4.3 mmol/L (ref 3.5–5.1)
Sodium: 142 mmol/L (ref 135–145)
Total Bilirubin: 0.6 mg/dL (ref 0.3–1.2)
Total Protein: 5.4 g/dL — ABNORMAL LOW (ref 6.5–8.1)

## 2015-03-02 LAB — I-STAT CG4 LACTIC ACID, ED: Lactic Acid, Venous: 0.99 mmol/L (ref 0.5–2.0)

## 2015-03-02 LAB — AMMONIA: Ammonia: 52 umol/L — ABNORMAL HIGH (ref 9–35)

## 2015-03-02 LAB — TSH: TSH: 0.181 u[IU]/mL — AB (ref 0.350–4.500)

## 2015-03-02 LAB — BRAIN NATRIURETIC PEPTIDE: B NATRIURETIC PEPTIDE 5: 274.1 pg/mL — AB (ref 0.0–100.0)

## 2015-03-02 LAB — MRSA PCR SCREENING: MRSA BY PCR: NEGATIVE

## 2015-03-02 LAB — VITAMIN B12: Vitamin B-12: 145 pg/mL — ABNORMAL LOW (ref 180–914)

## 2015-03-02 LAB — T4, FREE: FREE T4: 1.79 ng/dL — AB (ref 0.61–1.12)

## 2015-03-02 MED ORDER — ENOXAPARIN SODIUM 40 MG/0.4ML ~~LOC~~ SOLN
40.0000 mg | SUBCUTANEOUS | Status: DC
  Administered 2015-03-02 – 2015-03-04 (×2): 40 mg via SUBCUTANEOUS
  Filled 2015-03-02 (×6): qty 0.4

## 2015-03-02 MED ORDER — CYANOCOBALAMIN 1000 MCG/ML IJ SOLN
100.0000 ug | Freq: Every day | INTRAMUSCULAR | Status: DC
  Administered 2015-03-02 – 2015-03-06 (×3): 100 ug via INTRAMUSCULAR
  Filled 2015-03-02 (×5): qty 0.1

## 2015-03-02 MED ORDER — VANCOMYCIN HCL 500 MG IV SOLR
500.0000 mg | Freq: Two times a day (BID) | INTRAVENOUS | Status: DC
  Administered 2015-03-02 – 2015-03-04 (×4): 500 mg via INTRAVENOUS
  Filled 2015-03-02 (×6): qty 500

## 2015-03-02 MED ORDER — ACETAMINOPHEN 10 MG/ML IV SOLN: 1000.0000 mg | Freq: Four times a day (QID) | INTRAVENOUS | Status: DC | PRN

## 2015-03-02 MED ORDER — INSULIN ASPART 100 UNIT/ML ~~LOC~~ SOLN
0.0000 [IU] | Freq: Three times a day (TID) | SUBCUTANEOUS | Status: DC
  Administered 2015-03-02: 2 [IU] via SUBCUTANEOUS
  Administered 2015-03-03: 5 [IU] via SUBCUTANEOUS
  Administered 2015-03-03: 2 [IU] via SUBCUTANEOUS
  Administered 2015-03-03: 3 [IU] via SUBCUTANEOUS
  Administered 2015-03-04 (×2): 2 [IU] via SUBCUTANEOUS
  Administered 2015-03-05 – 2015-03-06 (×3): 1 [IU] via SUBCUTANEOUS

## 2015-03-02 MED ORDER — METHYLPREDNISOLONE SODIUM SUCC 125 MG IJ SOLR
60.0000 mg | Freq: Four times a day (QID) | INTRAMUSCULAR | Status: DC
  Administered 2015-03-02 – 2015-03-03 (×5): 60 mg via INTRAVENOUS
  Filled 2015-03-02 (×2): qty 2
  Filled 2015-03-02: qty 0.96
  Filled 2015-03-02 (×2): qty 2
  Filled 2015-03-02: qty 0.96
  Filled 2015-03-02: qty 2
  Filled 2015-03-02 (×2): qty 0.96

## 2015-03-02 MED ORDER — VANCOMYCIN HCL IN DEXTROSE 1-5 GM/200ML-% IV SOLN
1000.0000 mg | Freq: Once | INTRAVENOUS | Status: AC
  Administered 2015-03-02: 1000 mg via INTRAVENOUS
  Filled 2015-03-02: qty 200

## 2015-03-02 MED ORDER — LEVOTHYROXINE SODIUM 100 MCG IV SOLR
50.0000 ug | Freq: Every day | INTRAVENOUS | Status: DC
  Filled 2015-03-02 (×2): qty 5

## 2015-03-02 MED ORDER — LEVOTHYROXINE SODIUM 137 MCG PO TABS
137.0000 ug | ORAL_TABLET | Freq: Every day | ORAL | Status: DC
  Filled 2015-03-02 (×2): qty 1

## 2015-03-02 MED ORDER — CETYLPYRIDINIUM CHLORIDE 0.05 % MT LIQD
7.0000 mL | Freq: Two times a day (BID) | OROMUCOSAL | Status: DC
  Administered 2015-03-02 – 2015-03-03 (×4): 7 mL via OROMUCOSAL

## 2015-03-02 MED ORDER — DEXTROSE 5 % IV SOLN
2.0000 g | Freq: Two times a day (BID) | INTRAVENOUS | Status: DC
  Administered 2015-03-02 – 2015-03-04 (×6): 2 g via INTRAVENOUS
  Filled 2015-03-02 (×10): qty 2

## 2015-03-02 MED ORDER — PANTOPRAZOLE SODIUM 40 MG IV SOLR
40.0000 mg | INTRAVENOUS | Status: DC
  Administered 2015-03-02 – 2015-03-05 (×4): 40 mg via INTRAVENOUS
  Filled 2015-03-02 (×5): qty 40

## 2015-03-02 MED ORDER — IPRATROPIUM-ALBUTEROL 0.5-2.5 (3) MG/3ML IN SOLN
3.0000 mL | Freq: Four times a day (QID) | RESPIRATORY_TRACT | Status: DC
  Administered 2015-03-02 – 2015-03-06 (×16): 3 mL via RESPIRATORY_TRACT
  Filled 2015-03-02 (×16): qty 3

## 2015-03-02 MED ORDER — LEVOTHYROXINE SODIUM 100 MCG IV SOLR
25.0000 ug | Freq: Every day | INTRAVENOUS | Status: DC
  Administered 2015-03-02 – 2015-03-03 (×2): 25 ug via INTRAVENOUS
  Filled 2015-03-02 (×4): qty 5

## 2015-03-02 MED ORDER — LACTULOSE ENEMA
300.0000 mL | Freq: Every day | ORAL | Status: DC
  Administered 2015-03-02: 300 mL via RECTAL
  Filled 2015-03-02 (×2): qty 300

## 2015-03-02 MED ORDER — DEXTROSE 5 % IV SOLN
2.0000 g | Freq: Once | INTRAVENOUS | Status: AC
  Administered 2015-03-02: 2 g via INTRAVENOUS
  Filled 2015-03-02: qty 2

## 2015-03-02 MED ORDER — CHLORHEXIDINE GLUCONATE 0.12 % MT SOLN
15.0000 mL | Freq: Two times a day (BID) | OROMUCOSAL | Status: DC
  Administered 2015-03-02 – 2015-03-06 (×9): 15 mL via OROMUCOSAL
  Filled 2015-03-02 (×12): qty 15

## 2015-03-02 NOTE — Progress Notes (Signed)
[facility]  Full SW Psychosocial assessment to follow.  Lorri Frederickonna T. Jaci LazierCrowder, [place] [phone]

## 2015-03-02 NOTE — Progress Notes (Signed)
ABG obtained critical C02 reported to MD.  Pt is not to be placed on ventilator, MD is talking to the family about Bipap or not.

## 2015-03-02 NOTE — Progress Notes (Signed)
[facility] PROGRESS NOTE  Murl [place] Dilauro WUJ:[id number] DOB: 04/15/1929 DOA: 03/01/2015 PCP: Julian HySOUTH,STEPHEN ALAN, MD  Assessment/Plan: 1. Acute respiratory failure with hypoxia and hypercarbia The patient is presenting with complaints of confusion as well as shortness of breath. Chest x-ray is clear. ABG shows significant hypercarbia with acidosis. continuous BiPAP. Monitor ABG. Continue with  DuoNeb's as well as Solu-Medrol 60 mg every 6 hours. Continue with broad spectrum.  Monitor volume status, to consider lasix.   2.Acute encephalopathy. Most like secondary to hypercarbia. ammonia mildly elevated. Will start lactulose.  Avoiding sedative medications. B 12 , will start supplement.   3.Hypothyroidism.  free T4 high, TSH low. Will decrease synthroid dose to 100 mcg (50 mcg IV) Continue Synthroid.  4.Chronic diastolic heart failure. Holding Lasix at present the setting of respiratory failure.  5.Fever; continue with IV antibiotics. Follow culture.    GERD. Continuing PPI.  Code Status: DNR Family Communication: discussed with grandson Lenor DerrickOwens Donal who is POA. He agree with palliative care consult for goals of care.  Disposition Plan: Remain ion the step down unit/    Consultants:  Palliative care team.   Procedures:  none  Antibiotics:  Vancomycin 7-20  Ceftazidime 7-20  HPI/Subjective: On BIPAP, respond to stimuli, say few words, relates pain, abdomen.   Objective: Filed Vitals:   03/02/15 0823  BP:   Pulse: 85  Temp:   Resp: 15   No intake or output data in the 24 hours ending 03/02/15 0823 Filed Weights   03/01/15 2351 03/02/15 0530  Weight: 58.968 kg (130 lb) 55.6 kg (122 lb 9.2 oz)    Exam:   General: lethargic, say few words to stimuli.   Cardiovascular: S 1, S 2 RRR  Respiratory: bilateral air movement, bilateral ronchus.   Abdomen: bs present, soft, nt  Musculoskeletal: no edema  Data Reviewed: Basic Metabolic  Panel:  Recent Labs Lab 03/02/15 0013 03/02/15 0349  NA 144 142  K 4.3 4.3  CL 100* 104  CO2 33* 32  GLUCOSE 128* 130*  BUN 23* 21*  CREATININE 0.80 0.81  CALCIUM 9.4 8.5*   Liver Function Tests:  Recent Labs Lab 03/02/15 0349  AST 17  ALT 11*  ALKPHOS 70  BILITOT 0.6  PROT 5.4*  ALBUMIN 2.8*   No results for input(s): LIPASE, AMYLASE in the last 168 hours.  Recent Labs Lab 03/02/15 0635  AMMONIA 52*   CBC:  Recent Labs Lab 03/02/15 0013 03/02/15 0349  WBC 10.5 9.8  NEUTROABS 8.8* 7.3  HGB 12.6 11.2*  HCT 41.8 37.9  MCV 91.7 92.7  PLT 298 282   Cardiac Enzymes: No results for input(s): CKTOTAL, CKMB, CKMBINDEX, TROPONINI in the last 168 hours. BNP (last 3 results)  Recent Labs  11/06/14 2213 11/13/14 1543 03/02/15 0112  BNP 75.8 61.6 274.1*    ProBNP (last 3 results)  Recent Labs  05/15/14 2119 05/17/14 1446  PROBNP 892.3* 690.8*    CBG: No results for input(s): GLUCAP in the last 168 hours.  No results found for this or any previous visit (from the past 240 hour(s)).   Studies: Dg Chest Port 1 View  03/02/2015   CLINICAL DATA:  81 year old female with shortness of breath. Concern for pneumonia.  EXAM: PORTABLE CHEST - 1 VIEW  COMPARISON:  Radiograph dated 01/23/2015  FINDINGS: The patient is mildly rotated to the left. Single-view of the chest demonstrates emphysematous changes of the lungs. No focal consolidation, pleural effusion, or pneumothorax. Stable cardiac silhouette. Degenerative changes  of the spine.  IMPRESSION: Emphysema.  No focal consolidation or pneumothorax.   Electronically Signed   By: Elgie Collard M.D.   On: 03/02/2015 00:56    Scheduled Meds: . antiseptic oral rinse  7 mL Mouth Rinse q12n4p  . cefTAZidime (FORTAZ)  IV  2 g Intravenous Q12H  . chlorhexidine  15 mL Mouth Rinse BID  . enoxaparin (LOVENOX) injection  40 mg Subcutaneous Q24H  . ipratropium-albuterol  3 mL Nebulization Q6H  . levothyroxine  137 mcg  Oral QAC breakfast  . methylPREDNISolone (SOLU-MEDROL) injection  60 mg Intravenous Q6H  . pantoprazole (PROTONIX) IV  40 mg Intravenous Q24H  . vancomycin  500 mg Intravenous Q12H   Continuous Infusions:   Principal Problem:   Acute respiratory failure with hypoxia and hypercarbia Active Problems:   Dyslipidemia   Essential hypertension, benign   Hypothyroidism   Chronic diastolic heart failure   GERD (gastroesophageal reflux disease)   HCAP (healthcare-associated pneumonia)    Time spent: 35 minutes.     Hartley Barefoot A  [facility] Pager [phone]. If 7PM-7AM, please contact night-coverage at [email or web address], password [facility] 03/02/2015, 8:23 AM  LOS: 0 days

## 2015-03-02 NOTE — ED Notes (Signed)
Admitting at bedside 

## 2015-03-02 NOTE — Care Management Note (Signed)
Case Management Note  Patient Details  Name: Mauricia AreaMurl [place] Alyea MRN: [id number] Date of Birth: 02/27/1929  Subjective/Objective:     Adm w resp distress             Action/Plan: from nsg facility, sw ref   Expected Discharge Date:                  Expected Discharge Plan:  Skilled Nursing Facility  In-House Referral:  Clinical Social Work  Discharge planning Services     Post Acute Care Choice:    Choice offered to:     DME Arranged:    DME Agency:     HH Arranged:    HH Agency:     Status of Service:     Medicare Important Message Given:    Date Medicare IM Given:    Medicare IM give by:    Date Additional Medicare IM Given:    Additional Medicare Important Message give by:     If discussed at Long Length of Stay Meetings, dates discussed:    Additional Comments: ur review done  Hanley HaysDowell, Imari Reen T, RN 03/02/2015, 8:43 AM

## 2015-03-02 NOTE — ED Notes (Signed)
Holding for stepdown

## 2015-03-02 NOTE — H&P (Signed)
[facility] History and Physical  Patient: [name] [name]  MRN: [id number]  DOB: 08/21/1928  DOS: the patient was seen and examined on 03/02/2015 PCP: Julian HySOUTH,STEPHEN ALAN, MD  Referring physician: Dr. Norlene Campbellotter Chief Complaint: Shortness of breath  HPI: [name] [name] is a 81 y.o. female with Past medical history of COPD, hypertension, hypothyroidism, CHF systolic, GERD, chronic kidney disease. The patient was brought in from the nursing home with complaints of shortness of breath and altered mental status. The history is limited since the patient was nonverbal and was unable to provide any significant history. As per the documentation patient was at his baseline few days ago. No recent change in medications reported. No nausea no vomiting no diarrhea reported. Chest pain no abdominal pain. Patient was initially communicative but at the time of my evaluation was nonverbal and was only withdrawing to painful stimuli.  [facility]  At her baseline does not bedridden And is dependent for most of her ADL does nots her medication on her own.  Review of Systems: as mentioned in the history of present illness.  A comprehensive review of the other systems is negative.  Past Medical History  Diagnosis Date  . Hypertension   . Hyperlipidemia   . Thyroid disease   . Hypokalemia   . Vitamin D deficiency   . COPD (chronic obstructive pulmonary disease)   . CHF (congestive heart failure)   . GERD (gastroesophageal reflux disease)   . CKD (chronic kidney disease), stage III    Past Surgical History  Procedure Laterality Date  . Cholecystectomy    . Abdominal hysterectomy     Social History:  reports that she has quit smoking. Her smoking use included Cigarettes. She has never used smokeless tobacco. She reports that she does not drink alcohol or use illicit drugs.  No Known Allergies  Family History  Problem Relation Age of Onset  . Heart attack  Mother   . Heart attack Brother     Prior to Admission medications   Medication Sig Start Date End Date Taking? Authorizing Provider  albuterol (PROVENTIL) (2.5 MG/3ML) 0.083% nebulizer solution Take 3 mLs (2.5 mg total) by nebulization every 2 (two) hours as needed for wheezing. 01/25/15  Yes Shanker Levora DredgeM Ghimire, MD  carvedilol (COREG) 3.125 MG tablet Take 1 tablet (3.125 mg total) by mouth 2 (two) times daily with a meal. 09/24/14  Yes Leroy SeaPrashant K Singh, MD  cholecalciferol (VITAMIN D) 1000 UNITS tablet Take 1,000 Units by mouth daily.   Yes Historical Provider, MD  feeding supplement, RESOURCE BREEZE, (RESOURCE BREEZE) LIQD Take 1 Container by mouth 3 (three) times daily between meals. 01/25/15  Yes Shanker Levora DredgeM Ghimire, MD  furosemide (LASIX) 40 MG tablet Take 1 tablet (40 mg total) by mouth daily. 09/25/14  Yes Leroy SeaPrashant K Singh, MD  guaifenesin (ROBITUSSIN) 100 MG/5ML syrup Take 15 mLs (300 mg total) by mouth every 6 (six) hours. 01/25/15  Yes Shanker Levora DredgeM Ghimire, MD  HYDROcodone-acetaminophen (NORCO/VICODIN) 5-325 MG per tablet Take 1 tablet by mouth every 6 (six) hours as needed for moderate pain. 01/25/15  Yes Shanker Levora DredgeM Ghimire, MD  ipratropium (ATROVENT) 0.02 % nebulizer solution Take 0.5 mg by nebulization 4 (four) times daily.   Yes Historical Provider, MD  levothyroxine (SYNTHROID, LEVOTHROID) 137 MCG tablet Take 137 mcg by mouth daily before breakfast.   Yes Historical Provider, MD  lisinopril (PRINIVIL,ZESTRIL) 2.5 MG tablet Take 1 tablet (2.5 mg total) by mouth daily. 05/18/14  Yes  Hollice Espy, MD  omeprazole (PRILOSEC OTC) 20 MG tablet Take 20 mg by mouth daily.   Yes Historical Provider, MD  polyethylene glycol (MIRALAX / GLYCOLAX) packet Take 17 g by mouth 2 (two) times daily. 09/25/14  Yes Leroy Sea, MD  potassium chloride SA (K-DUR,KLOR-CON) 20 MEQ tablet Take 20 mEq by mouth daily.   Yes Historical Provider, MD  pravastatin (PRAVACHOL) 40 MG tablet Take 40 mg by mouth daily at 6  PM.    Yes Historical Provider, MD  sennosides-docusate sodium (SENOKOT-S) 8.6-50 MG tablet Take 2 tablets by mouth at bedtime.   Yes Historical Provider, MD  LORazepam (ATIVAN) 0.5 MG tablet Take 1 tablet (0.5 mg total) by mouth every 8 (eight) hours as needed for anxiety. Patient not taking: Reported on 03/02/2015 01/25/15   Maretta Bees, MD    Physical Exam: Filed Vitals:   03/02/15 0400 03/02/15 0445 03/02/15 0500 03/02/15 0530  BP: 115/55 115/53 128/54   Pulse: 90 89 87   Temp:      TempSrc:      Resp: 13 13 13    Height:    5\' 1"  (1.549 m)  Weight:    55.6 kg (122 lb 9.2 oz)  SpO2: 98% 97% 99%     General: Drowsy and lethargic not following commands Appear in moderate distress Eyes: PERRL ENT: Oral Mucosa clear dry. Neck: no JVD Cardiovascular: S1 and S2 Present, no Murmur, Peripheral Pulses Present Respiratory: Bilateral Air entry equal and Decreased,  Right more than left Crackles, no wheezes Abdomen: Bowel Sound , Soft and nontender Extremities: Bilateral Pedal edema, no calf tenderness Neurologic: As above drowsy and lethargic not following commands and regular basis  Labs on Admission:  CBC:  Recent Labs Lab 03/02/15 0013 03/02/15 0349  WBC 10.5 9.8  NEUTROABS 8.8* 7.3  HGB 12.6 11.2*  HCT 41.8 37.9  MCV 91.7 92.7  PLT 298 282    CMP     Component Value Date/Time   NA 142 03/02/2015 0349   K 4.3 03/02/2015 0349   CL 104 03/02/2015 0349   CO2 32 03/02/2015 0349   GLUCOSE 130* 03/02/2015 0349   BUN 21* 03/02/2015 0349   CREATININE 0.81 03/02/2015 0349   CALCIUM 8.5* 03/02/2015 0349   PROT 5.4* 03/02/2015 0349   ALBUMIN 2.8* 03/02/2015 0349   AST 17 03/02/2015 0349   ALT 11* 03/02/2015 0349   ALKPHOS 70 03/02/2015 0349   BILITOT 0.6 03/02/2015 0349   GFRNONAA >60 03/02/2015 0349   GFRAA >60 03/02/2015 0349    No results for input(s): LIPASE, AMYLASE in the last 168 hours.  No results for input(s): CKTOTAL, CKMB, CKMBINDEX, TROPONINI in the  last 168 hours. BNP (last 3 results)  Recent Labs  11/06/14 2213 11/13/14 1543 03/02/15 0112  BNP 75.8 61.6 274.1*    ProBNP (last 3 results)  Recent Labs  05/15/14 2119 05/17/14 1446  PROBNP 892.3* 690.8*     Radiological Exams on Admission: Dg Chest Port 1 View  03/02/2015   CLINICAL DATA:  81 year old female with shortness of breath. Concern for pneumonia.  EXAM: PORTABLE CHEST - 1 VIEW  COMPARISON:  Radiograph dated 01/23/2015  FINDINGS: The patient is mildly rotated to the left. Single-view of the chest demonstrates emphysematous changes of the lungs. No focal consolidation, pleural effusion, or pneumothorax. Stable cardiac silhouette. Degenerative changes of the spine.  IMPRESSION: Emphysema.  No focal consolidation or pneumothorax.   Electronically Signed   By: Elgie Collard  M.D.   On: 03/02/2015 00:56   EKG: Independently reviewed. Sinus tachycardia.  Assessment/Plan Principal Problem:   Acute respiratory failure with hypoxia and hypercarbia Active Problems:   Dyslipidemia   Essential hypertension, benign   Hypothyroidism   Chronic diastolic heart failure   GERD (gastroesophageal reflux disease)   HCAP (healthcare-associated pneumonia)   1. Acute respiratory failure with hypoxia and hypercarbia The patient is presenting with complaints of confusion as well as shortness of breath. Chest x-ray is clear. Examination reveals crackles in the bases. With this her ABG shows significant hypercarbia with acidosis. The patient will be admitted to step down unit. I discussed with the family and they are okay with use of BiPAP as they have used in the past. I would put her on continuous BiPAP. Monitor ABG. Also treat her with DuoNeb's as well as Solu-Medrol 60 mg every 6 hours. She will be treated broadly with antibiotics secondary to her nursing home patient status. Should the patient does not improve on BiPAP than the consultation with the family about possible  comfort care measures should be taken.  2.Acute encephalopathy. Most like secondary to hypercarbia. We will check the other blood work. Avoiding sedative medications.  3.Hypothyroidism. Check free T4 as well as TSH. Continue Synthroid.  4.Chronic diastolic heart failure. Holding Lasix at present the setting of respiratory failure.  5.GERD. Continuing PPI.  Advance goals of care discussion: DNR/DNI, patient has MOST Formed. Discussed with family about use of BiPAP.   DVT Prophylaxis: subcutaneous Heparin Nutrition: Nothing by mouth until completely awake  Family Communication: family was present at bedside, opportunity was given to ask question and all questions were answered satisfactorily at the time of interview. Disposition: Admitted as observationl  stepdown unit.  Author: Lynden Oxford, MD [facility] Pager: [phone] 03/02/2015  If 7PM-7AM, please contact night-coverage [email or web address] Password [email or web address]

## 2015-03-02 NOTE — Progress Notes (Signed)
ANTIBIOTIC CONSULT NOTE - INITIAL  Pharmacy Consult for Vancomycin/Ceftazidime Indication: rule out pneumonia  No Known Allergies  Patient Measurements: Height: 5\' 1"  (154.9 cm) Weight: 130 lb (58.968 kg) IBW/kg (Calculated) : 47.8  Vital Signs: Temp: 100 F (37.8 C) (07/20 0027) Temp Source: Rectal (07/20 0027) BP: 143/61 mmHg (07/20 0315) Pulse Rate: 100 (07/20 0315)  Labs:  Recent Labs  03/02/15 0013  WBC 10.5  HGB 12.6  PLT 298  CREATININE 0.80   Estimated Creatinine Clearance: 43.2 mL/min (by C-G formula based on Cr of 0.8).  Medical History: Past Medical History  Diagnosis Date  . Hypertension   . Hyperlipidemia   . Thyroid disease   . Hypokalemia   . Vitamin D deficiency   . COPD (chronic obstructive pulmonary disease)   . CHF (congestive heart failure)   . GERD (gastroesophageal reflux disease)   . CKD (chronic kidney disease), stage III    Assessment: 81 y/o F from SNF with shortness of breath, WBC WNL, renal function age appropriate, CXR with emphysematous changes, other labs as above.   Goal of Therapy:  Vancomycin trough level 15-20 mcg/ml  Plan:  -Vancomycin 500 mg IV q12h -Ceftazidime 2g IV q12h -Trend WBC, temp, renal function  -Drug levels as indicated   Abran DukeLedford, Monserath Neff 03/02/2015,3:35 AM

## 2015-03-02 NOTE — Progress Notes (Signed)
ABG results called to Dr Sunnie Nielsenegalado. Read back and affirmed. Orders to keep pt on bipap.

## 2015-03-03 ENCOUNTER — Inpatient Hospital Stay (HOSPITAL_COMMUNITY): Payer: Medicare Other

## 2015-03-03 DIAGNOSIS — I10 Essential (primary) hypertension: Secondary | ICD-10-CM

## 2015-03-03 DIAGNOSIS — I5032 Chronic diastolic (congestive) heart failure: Secondary | ICD-10-CM

## 2015-03-03 DIAGNOSIS — Z515 Encounter for palliative care: Secondary | ICD-10-CM

## 2015-03-03 LAB — URINE CULTURE: Culture: NO GROWTH

## 2015-03-03 LAB — GLUCOSE, CAPILLARY
GLUCOSE-CAPILLARY: 292 mg/dL — AB (ref 65–99)
GLUCOSE-CAPILLARY: 171 mg/dL — AB (ref 65–99)
Glucose-Capillary: 153 mg/dL — ABNORMAL HIGH (ref 65–99)
Glucose-Capillary: 211 mg/dL — ABNORMAL HIGH (ref 65–99)

## 2015-03-03 LAB — CBC
HCT: 33.7 % — ABNORMAL LOW (ref 36.0–46.0)
Hemoglobin: 10.2 g/dL — ABNORMAL LOW (ref 12.0–15.0)
MCH: 27.2 pg (ref 26.0–34.0)
MCHC: 30.3 g/dL (ref 30.0–36.0)
MCV: 89.9 fL (ref 78.0–100.0)
PLATELETS: 253 10*3/uL (ref 150–400)
RBC: 3.75 MIL/uL — AB (ref 3.87–5.11)
RDW: 14.5 % (ref 11.5–15.5)
WBC: 7 10*3/uL (ref 4.0–10.5)

## 2015-03-03 LAB — BASIC METABOLIC PANEL
Anion gap: 6 (ref 5–15)
BUN: 30 mg/dL — ABNORMAL HIGH (ref 6–20)
CHLORIDE: 105 mmol/L (ref 101–111)
CO2: 32 mmol/L (ref 22–32)
Calcium: 8.9 mg/dL (ref 8.9–10.3)
Creatinine, Ser: 0.81 mg/dL (ref 0.44–1.00)
GFR calc non Af Amer: >60 mL/min (ref >60)
Glucose, Bld: 142 mg/dL — ABNORMAL HIGH (ref 65–99)
POTASSIUM: 4 mmol/L (ref 3.5–5.1)
Sodium: 143 mmol/L (ref 135–145)

## 2015-03-03 LAB — AMMONIA: AMMONIA: 29 umol/L (ref 9–35)

## 2015-03-03 MED ORDER — POTASSIUM CHLORIDE CRYS ER 20 MEQ PO TBCR
40.0000 meq | EXTENDED_RELEASE_TABLET | Freq: Once | ORAL | Status: AC
  Administered 2015-03-03: 40 meq via ORAL
  Filled 2015-03-03: qty 2

## 2015-03-03 MED ORDER — METHYLPREDNISOLONE SODIUM SUCC 40 MG IJ SOLR
40.0000 mg | Freq: Four times a day (QID) | INTRAMUSCULAR | Status: DC
Start: 2015-03-03 — End: 2015-03-04
  Administered 2015-03-03 – 2015-03-04 (×5): 40 mg via INTRAVENOUS
  Filled 2015-03-03 (×8): qty 1

## 2015-03-03 MED ORDER — FUROSEMIDE 40 MG PO TABS
40.0000 mg | ORAL_TABLET | Freq: Every day | ORAL | Status: DC
  Administered 2015-03-03: 40 mg via ORAL
  Filled 2015-03-03 (×2): qty 1

## 2015-03-03 MED ORDER — FUROSEMIDE 10 MG/ML IJ SOLN
20.0000 mg | Freq: Once | INTRAMUSCULAR | Status: AC
  Administered 2015-03-03: 20 mg via INTRAVENOUS
  Filled 2015-03-03: qty 2

## 2015-03-03 MED ORDER — ACETAMINOPHEN 325 MG PO TABS
650.0000 mg | ORAL_TABLET | Freq: Four times a day (QID) | ORAL | Status: DC | PRN
  Administered 2015-03-04 – 2015-03-05 (×2): 650 mg via ORAL
  Filled 2015-03-03 (×2): qty 2

## 2015-03-03 MED ORDER — LACTULOSE 10 GM/15ML PO SOLN
10.0000 g | Freq: Every day | ORAL | Status: DC
  Administered 2015-03-03 – 2015-03-05 (×2): 10 g via ORAL
  Filled 2015-03-03 (×4): qty 15

## 2015-03-03 NOTE — Progress Notes (Signed)
RT note-Patient remains off the Bipap and on 6l/min [place]. Continue to monitor.

## 2015-03-03 NOTE — Progress Notes (Signed)
Rt entered room to draw ABG, explaining procedure several times to pt she continued to refuse becoming combative with RT.

## 2015-03-03 NOTE — Consult Note (Signed)
Consultation Note Date: 03/03/2015   Patient Name: [name] [name]  DOB: 1929-07-19  MRN: [id number]  Age / Sex: 81 y.o., female   PCP: Adrian Prince, MD Referring Physician: Alba Cory, MD  Reason for Consultation: Establishing goals of care  Palliative Care Assessment and Plan Summary of Established Goals of Care and Medical Treatment Preferences   Clinical Assessment/Narrative: Pt is a 81 yo female admitted with AMS, dyspnea. She has a h/o COPD, HTN, hypothyroidism, CKD and dCHF. She was in hypercarbic resp failure on admission with C02 of 68. Placed on BiPaP until about 0300 when she finally pulled off the mask. She has also been refusing other labs this am. She states she "never" wants the bipap again. When I attempted to explain what could happen going forward without it she was not able to process this level of medical complexity. In speaking with T. Ethelda Chick, Np who sees [name] [name] for out-patient palliative care, compliance has been an ongoing issue for pt in terms of refusing 02, labs, nebs. Pt's POA is grandson [name]. Per [name] [name], grandson brought [place] from [place] to help care for her. Apparently she was in very poor condition there and in some ways this is an improvement in his eyes. Pt has significant memory deficits and is confused. She is not agitated during our discussion.  Contacts/Participants in Discussion: Primary Decision Maker: Grandson HCPOA: unclear at this point Chart references POA status but not clear as to health care POA. Pt does have children but was unable to tell me there names or how many. She states she also has a husband but is not clear as to where he is  Code Status/Advance Care Planning:  DNR  Planning on meeting with grandson to address usage of bipap as well as rehospitalization, hospice 03/04/15 at 0930  Symptom Management:   Dyspnea: Pt more alert after bipap but intolerant of devicee. Opioids would help  breathing but could also increae the liklihood of retaing C02 Will  meet with grandson first.    Additional Recommendations (Limitations, Scope, Preferences):  n/a Psycho-social/Spiritual:   Support System: yes but limited  Desire for further Chaplaincy support:no  Prognosis: < 6 months. Pt likely meets hospice criteria for are in SNF  Discharge Planning:  Back to SNF with out-patient palliative care following. Would recommend additional support due to disease progression with hospice       Chief Complaint/History of Present Illness: Pt is a 81 yo female with h/o COPD now admitted with AMS, increased dyspnea and is in hypercarbic resp failure. C02 on adm was 68. Is improved on bipap but pt intolerant of mask  Primary Diagnoses  Present on Admission:  . HCAP (healthcare-associated pneumonia) . Acute respiratory failure with hypoxia and hypercarbia . Hypothyroidism . GERD (gastroesophageal reflux disease) . Essential hypertension, benign . Dyslipidemia . Chronic diastolic heart failure  Palliative Review of Systems: Confused. Denies pain but does state she is SHOB. No chest pain. No nausea. I have reviewed the medical record, interviewed the patient and family, and examined the patient. The following aspects are pertinent.  Past Medical History  Diagnosis Date  . Hypertension   . Hyperlipidemia   . Thyroid disease   . Hypokalemia   . Vitamin D deficiency   . COPD (chronic obstructive pulmonary disease)   . CHF (congestive heart failure)   . GERD (gastroesophageal reflux disease)   . CKD (chronic kidney disease), stage III    History  Social History  . Marital Status: Single    Spouse Name: N/A  . Number of Children: N/A  . Years of Education: N/A   Social History Main Topics  . Smoking status: Former Smoker    Types: Cigarettes  . Smokeless tobacco: Never Used     Comment: quit smoking years ago  . Alcohol Use: No  . Drug Use: No  . Sexual Activity: Not  Currently    Birth Control/ Protection: Post-menopausal   Other Topics Concern  . None   Social History Narrative   Family History  Problem Relation Age of Onset  . Heart attack Mother   . Heart attack Brother    Scheduled Meds: . antiseptic oral rinse  7 mL Mouth Rinse q12n4p  . cefTAZidime (FORTAZ)  IV  2 g Intravenous Q12H  . chlorhexidine  15 mL Mouth Rinse BID  . cyanocobalamin  100 mcg Intramuscular Daily  . enoxaparin (LOVENOX) injection  40 mg Subcutaneous Q24H  . furosemide  40 mg Oral Daily  . insulin aspart  0-9 Units Subcutaneous TID WC  . ipratropium-albuterol  3 mL Nebulization Q6H  . lactulose  10 g Oral Daily  . levothyroxine  25 mcg Intravenous QAC breakfast  . methylPREDNISolone (SOLU-MEDROL) injection  40 mg Intravenous Q6H  . pantoprazole (PROTONIX) IV  40 mg Intravenous Q24H  . vancomycin  500 mg Intravenous Q12H   Continuous Infusions:  PRN Meds:.acetaminophen Medications Prior to Admission:  Prior to Admission medications   Medication Sig Start Date End Date Taking? Authorizing Provider  albuterol (PROVENTIL) (2.5 MG/3ML) 0.083% nebulizer solution Take 3 mLs (2.5 mg total) by nebulization every 2 (two) hours as needed for wheezing. 01/25/15  Yes Shanker Levora Dredge, MD  carvedilol (COREG) 3.125 MG tablet Take 1 tablet (3.125 mg total) by mouth 2 (two) times daily with a meal. 09/24/14  Yes Leroy Sea, MD  cholecalciferol (VITAMIN D) 1000 UNITS tablet Take 1,000 Units by mouth daily.   Yes Historical Provider, MD  feeding supplement, RESOURCE BREEZE, (RESOURCE BREEZE) LIQD Take 1 Container by mouth 3 (three) times daily between meals. 01/25/15  Yes Shanker Levora Dredge, MD  furosemide (LASIX) 40 MG tablet Take 1 tablet (40 mg total) by mouth daily. 09/25/14  Yes Leroy Sea, MD  guaifenesin (ROBITUSSIN) 100 MG/5ML syrup Take 15 mLs (300 mg total) by mouth every 6 (six) hours. 01/25/15  Yes Shanker Levora Dredge, MD  HYDROcodone-acetaminophen (NORCO/VICODIN)  5-325 MG per tablet Take 1 tablet by mouth every 6 (six) hours as needed for moderate pain. 01/25/15  Yes Shanker Levora Dredge, MD  ipratropium (ATROVENT) 0.02 % nebulizer solution Take 0.5 mg by nebulization 4 (four) times daily.   Yes Historical Provider, MD  levothyroxine (SYNTHROID, LEVOTHROID) 137 MCG tablet Take 137 mcg by mouth daily before breakfast.   Yes Historical Provider, MD  lisinopril (PRINIVIL,ZESTRIL) 2.5 MG tablet Take 1 tablet (2.5 mg total) by mouth daily. 05/18/14  Yes Hollice Espy, MD  omeprazole (PRILOSEC OTC) 20 MG tablet Take 20 mg by mouth daily.   Yes Historical Provider, MD  polyethylene glycol (MIRALAX / GLYCOLAX) packet Take 17 g by mouth 2 (two) times daily. 09/25/14  Yes Leroy Sea, MD  potassium chloride SA (K-DUR,KLOR-CON) 20 MEQ tablet Take 20 mEq by mouth daily.   Yes Historical Provider, MD  pravastatin (PRAVACHOL) 40 MG tablet Take 40 mg by mouth daily at 6 PM.    Yes Historical Provider, MD  sennosides-docusate sodium (SENOKOT-S) 8.6-50  MG tablet Take 2 tablets by mouth at bedtime.   Yes Historical Provider, MD  LORazepam (ATIVAN) 0.5 MG tablet Take 1 tablet (0.5 mg total) by mouth every 8 (eight) hours as needed for anxiety. Patient not taking: Reported on 03/02/2015 01/25/15   Maretta Bees, MD   No Known Allergies CBC:    Component Value Date/Time   WBC 7.0 03/03/2015 0934   HGB 10.2* 03/03/2015 0934   HCT 33.7* 03/03/2015 0934   PLT 253 03/03/2015 0934   MCV 89.9 03/03/2015 0934   NEUTROABS 7.3 03/02/2015 0349   LYMPHSABS 1.3 03/02/2015 0349   MONOABS 1.1* 03/02/2015 0349   EOSABS 0.1 03/02/2015 0349   BASOSABS 0.1 03/02/2015 0349   Comprehensive Metabolic Panel:    Component Value Date/Time   NA 143 03/03/2015 0934   K 4.0 03/03/2015 0934   CL 105 03/03/2015 0934   CO2 32 03/03/2015 0934   BUN 30* 03/03/2015 0934   CREATININE 0.81 03/03/2015 0934   GLUCOSE 142* 03/03/2015 0934   CALCIUM 8.9 03/03/2015 0934   AST 17 03/02/2015  0349   ALT 11* 03/02/2015 0349   ALKPHOS 70 03/02/2015 0349   BILITOT 0.6 03/02/2015 0349   PROT 5.4* 03/02/2015 0349   ALBUMIN 2.8* 03/02/2015 0349    Physical Exam: Vital Signs: BP 118/43 mmHg  Pulse 90  Temp(Src) 98.1 F (36.7 C) (Oral)  Resp 25  Ht  (1.549 m)  Wt 55.8 kg (123 lb 0.3 oz)  BMI 23.26 kg/m2  SpO2 94% SpO2: SpO2: 94 % O2 Device: O2 Device: Nasal Cannula O2 Flow Rate: O2 Flow Rate (L/min): 6 L/min Intake/output summary:  Intake/Output Summary (Last 24 hours) at 03/03/15 1333 Last data filed at 03/02/15 2246  Gross per 24 hour  Intake     50 ml  Output      0 ml  Net     50 ml   LBM: Last BM Date:  (PTA) Baseline Weight: Weight: 58.968 kg (130 lb) Most recent weight: Weight: 55.8 kg (123 lb 0.3 oz)  Exam Findings:  General: Elderly frail female. She is alert, SHOB Resp: Increased work of breathing noted. Moist productive cough with greenish sputum Neuro: Alert to self. Knows she is in a hospital but not why or where. Has recent and remote memory deficits. States she lives with friends and works with Acupuncturist at [employer] ( Pt did do this in the past )         Palliative Performance Scale: 40-50%              Additional Data Reviewed: Recent Labs     03/02/15  0349  03/03/15  0934  WBC  9.8  7.0  HGB  11.2*  10.2*  PLT  282  253  NA  142  143  BUN  21*  30*  CREATININE  0.81  0.81     Time [id number] Time Out: 1055 Time Total: 70 min Greater than 50%  of this time was spent counseling and coordinating care related to the above assessment and plan.Discussed with out-pt Palliative Care provider who has been involved in pt's care at [name]  Signed by: Irean Hong, NP  Irean Hong, NP  03/03/2015, 1:33 PM  Please contact Palliative Medicine Team phone at [phone] for questions and concerns.

## 2015-03-03 NOTE — Progress Notes (Signed)
RT note- patient wanted the Bipap removed and currently on 6l/min [place] sp02 92%.

## 2015-03-03 NOTE — Progress Notes (Signed)
[facility] PROGRESS NOTE  [name] [name] ZOX:[id number] DOB: [birth year] DOA: 03/01/2015 PCP: [name] Hy, MD  Assessment/Plan:  [name] [name] is a 81 y.o. female with Past medical history of COPD, hypertension, hypothyroidism, CHF systolic, GERD, chronic kidney disease. The patient was brought in from the nursing home with respiratory failure  and altered mental status.  1. Acute respiratory failure with hypoxia and hypercarbia The patient is presents with  confusion as well as shortness of breath. Chest x-ray is clear. ABG shows significant hypercarbia with acidosis. Improved with BiPAP. BIPAP PRN.  Patient refuse ABG this am/  Continue with  DuoNeb's as well as Solu-Medrol 40 mg every 6 hours. Continue with broad spectrum antibiotics.  Resume lasix, repeat  chest x ray/   2.Acute encephalopathy. Most like secondary to hypercarbia. ammonia mildly elevated, has decreased with lactulose.  Avoiding sedative medications. B 12 deficiency : continue with  supplement.   3.Hypothyroidism.  free T4 high, TSH low. I have  decreased synthroid dose to 100 mcg (50 mcg IV) Continue Synthroid.  4.Chronic diastolic heart failure. Resume lasix. Repeat chest x ray/   5.Fever; continue with IV antibiotics. Follow culture. Blood culture, urine culture pending.    GERD. Continuing PPI.  Code Status: DNR Family Communication: discussed with grandson [name] [name] who is POA.  Disposition Plan: Remain ion the step down unit/    Consultants:  Palliative care team.   Procedures:  none  Antibiotics:  Vancomycin 7-20  Ceftazidime 7-20  HPI/Subjective: She is alert today, feeling better, breathing better. denies chest pain or abdominal pain.  She wants to eat.   Objective: Filed Vitals:   03/03/15 0751  BP: 108/27  Pulse: 93  Temp: 98 F (36.7 C)  Resp: 25    Intake/Output Summary (Last 24 hours) at 03/03/15 0818 Last data filed at 03/02/15  2246  Gross per 24 hour  Intake    100 ml  Output      0 ml  Net    100 ml   Filed Weights   03/01/15 2351 03/02/15 0530 03/03/15 0659  Weight: 58.968 kg (130 lb) 55.6 kg (122 lb 9.2 oz) 55.8 kg (123 lb 0.3 oz)    Exam:   General: Alert in no distress, Nasal Cannula Oxygen   Cardiovascular: S 1, S 2 RRR  Respiratory: bilateral air movement, no ronchus, no wheezing.   Abdomen: bs present, soft, nt  Musculoskeletal: no edema  Data Reviewed: Basic Metabolic Panel:  Recent Labs Lab 03/02/15 0013 03/02/15 0349  NA 144 142  K 4.3 4.3  CL 100* 104  CO2 33* 32  GLUCOSE 128* 130*  BUN 23* 21*  CREATININE 0.80 0.81  CALCIUM 9.4 8.5*   Liver Function Tests:  Recent Labs Lab 03/02/15 0349  AST 17  ALT 11*  ALKPHOS 70  BILITOT 0.6  PROT 5.4*  ALBUMIN 2.8*   No results for input(s): LIPASE, AMYLASE in the last 168 hours.  Recent Labs Lab 03/02/15 0635 03/03/15 0237  AMMONIA 52* 29   CBC:  Recent Labs Lab 03/02/15 0013 03/02/15 0349  WBC 10.5 9.8  NEUTROABS 8.8* 7.3  HGB 12.6 11.2*  HCT 41.8 37.9  MCV 91.7 92.7  PLT 298 282   Cardiac Enzymes: No results for input(s): CKTOTAL, CKMB, CKMBINDEX, TROPONINI in the last 168 hours. BNP (last 3 results)  Recent Labs  11/06/14 2213 11/13/14 1543 03/02/15 0112  BNP 75.8 61.6 274.1*    ProBNP (last 3 results)  Recent Labs  05/15/14 2119 05/17/14 1446  PROBNP 892.3* 690.8*    CBG:  Recent Labs Lab 03/02/15 0857 03/02/15 1126 03/02/15 1723 03/02/15 2144 03/03/15 0806  GLUCAP 96 143* 135* 133* 153*    Recent Results (from the past 240 hour(s))  MRSA PCR Screening     Status: None   Collection Time: 03/02/15  5:33 AM  Result Value Ref Range Status   MRSA by PCR NEGATIVE NEGATIVE Final    Comment:        The GeneXpert MRSA Assay (FDA approved for NASAL specimens only), is one component of a comprehensive MRSA colonization surveillance program. It is not intended to diagnose  MRSA infection nor to guide or monitor treatment for MRSA infections.      Studies: Dg Chest Port 1 View  03/02/2015   CLINICAL DATA:  81 year old female with shortness of breath. Concern for pneumonia.  EXAM: PORTABLE CHEST - 1 VIEW  COMPARISON:  Radiograph dated 01/23/2015  FINDINGS: The patient is mildly rotated to the left. Single-view of the chest demonstrates emphysematous changes of the lungs. No focal consolidation, pleural effusion, or pneumothorax. Stable cardiac silhouette. Degenerative changes of the spine.  IMPRESSION: Emphysema.  No focal consolidation or pneumothorax.   Electronically Signed   By: Elgie Collard M.D.   On: 03/02/2015 00:56    Scheduled Meds: . antiseptic oral rinse  7 mL Mouth Rinse q12n4p  . cefTAZidime (FORTAZ)  IV  2 g Intravenous Q12H  . chlorhexidine  15 mL Mouth Rinse BID  . cyanocobalamin  100 mcg Intramuscular Daily  . enoxaparin (LOVENOX) injection  40 mg Subcutaneous Q24H  . furosemide  40 mg Oral Daily  . insulin aspart  0-9 Units Subcutaneous TID WC  . ipratropium-albuterol  3 mL Nebulization Q6H  . lactulose  10 g Oral Daily  . levothyroxine  25 mcg Intravenous QAC breakfast  . methylPREDNISolone (SOLU-MEDROL) injection  40 mg Intravenous Q6H  . pantoprazole (PROTONIX) IV  40 mg Intravenous Q24H  . vancomycin  500 mg Intravenous Q12H   Continuous Infusions:   Principal Problem:   Acute respiratory failure with hypoxia and hypercarbia Active Problems:   Dyslipidemia   Essential hypertension, benign   Hypothyroidism   Chronic diastolic heart failure   GERD (gastroesophageal reflux disease)   HCAP (healthcare-associated pneumonia)    Time spent: 35 minutes.     [name] [name] A  [facility] Pager [phone]. If 7PM-7AM, please contact night-coverage at [email or web address], password [facility] 03/03/2015, 8:18 AM  LOS: 1 day

## 2015-03-03 NOTE — Progress Notes (Deleted)
Upon entering room to check pt's BiPAP, pt has been taken off of BiPAP and placed on nasal cannula.

## 2015-03-03 NOTE — Progress Notes (Signed)
Pt resting comfortably on 5l nasal cannula and does not wish to wear BiPAP at this time. RT will continue to monitor, if pt becomes SOB pt will be placed back on BiPAP.

## 2015-03-04 DIAGNOSIS — R0602 Shortness of breath: Secondary | ICD-10-CM

## 2015-03-04 LAB — CBC
HCT: 33.4 % — ABNORMAL LOW (ref 36.0–46.0)
Hemoglobin: 10.3 g/dL — ABNORMAL LOW (ref 12.0–15.0)
MCH: 27.2 pg (ref 26.0–34.0)
MCHC: 30.8 g/dL (ref 30.0–36.0)
MCV: 88.1 fL (ref 78.0–100.0)
PLATELETS: 254 10*3/uL (ref 150–400)
RBC: 3.79 MIL/uL — ABNORMAL LOW (ref 3.87–5.11)
RDW: 14.5 % (ref 11.5–15.5)
WBC: 9.5 10*3/uL (ref 4.0–10.5)

## 2015-03-04 LAB — GLUCOSE, CAPILLARY
Glucose-Capillary: 155 mg/dL — ABNORMAL HIGH (ref 65–99)
Glucose-Capillary: 155 mg/dL — ABNORMAL HIGH (ref 65–99)
Glucose-Capillary: 180 mg/dL — ABNORMAL HIGH (ref 65–99)
Glucose-Capillary: 128 mg/dL — ABNORMAL HIGH (ref 65–99)

## 2015-03-04 LAB — BASIC METABOLIC PANEL
ANION GAP: 7 (ref 5–15)
BUN: 24 mg/dL — ABNORMAL HIGH (ref 6–20)
CO2: 33 mmol/L — ABNORMAL HIGH (ref 22–32)
Calcium: 8.6 mg/dL — ABNORMAL LOW (ref 8.9–10.3)
Chloride: 100 mmol/L — ABNORMAL LOW (ref 101–111)
Creatinine, Ser: 0.74 mg/dL (ref 0.44–1.00)
Glucose, Bld: 172 mg/dL — ABNORMAL HIGH (ref 65–99)
POTASSIUM: 3.8 mmol/L (ref 3.5–5.1)
SODIUM: 140 mmol/L (ref 135–145)

## 2015-03-04 LAB — VANCOMYCIN, TROUGH: Vancomycin Tr: 13 ug/mL (ref 10.0–20.0)

## 2015-03-04 MED ORDER — MORPHINE SULFATE (CONCENTRATE) 10 MG/0.5ML PO SOLN
2.5000 mg | Freq: Four times a day (QID) | ORAL | Status: DC | PRN
  Administered 2015-03-04 – 2015-03-05 (×2): 2.6 mg via ORAL
  Filled 2015-03-04 (×2): qty 0.5

## 2015-03-04 MED ORDER — VANCOMYCIN HCL IN DEXTROSE 750-5 MG/150ML-% IV SOLN
750.0000 mg | Freq: Two times a day (BID) | INTRAVENOUS | Status: DC
  Administered 2015-03-04 – 2015-03-05 (×2): 750 mg via INTRAVENOUS
  Filled 2015-03-04 (×3): qty 150

## 2015-03-04 MED ORDER — FUROSEMIDE 10 MG/ML IJ SOLN
40.0000 mg | Freq: Every day | INTRAMUSCULAR | Status: DC
  Administered 2015-03-04: 40 mg via INTRAVENOUS
  Filled 2015-03-04 (×2): qty 4

## 2015-03-04 MED ORDER — POTASSIUM CHLORIDE CRYS ER 20 MEQ PO TBCR
40.0000 meq | EXTENDED_RELEASE_TABLET | Freq: Once | ORAL | Status: AC
  Administered 2015-03-04: 40 meq via ORAL
  Filled 2015-03-04: qty 2

## 2015-03-04 MED ORDER — METHYLPREDNISOLONE SODIUM SUCC 40 MG IJ SOLR
40.0000 mg | Freq: Two times a day (BID) | INTRAMUSCULAR | Status: DC
  Administered 2015-03-04: 40 mg via INTRAVENOUS
  Filled 2015-03-04 (×2): qty 1

## 2015-03-04 MED ORDER — CARVEDILOL 3.125 MG PO TABS
3.1250 mg | ORAL_TABLET | Freq: Two times a day (BID) | ORAL | Status: DC
  Administered 2015-03-04 – 2015-03-06 (×5): 3.125 mg via ORAL
  Filled 2015-03-04 (×8): qty 1

## 2015-03-04 MED ORDER — LEVOTHYROXINE SODIUM 50 MCG PO TABS: 50.0000 ug | ORAL_TABLET | Freq: Every day | ORAL | Status: DC

## 2015-03-04 MED ORDER — LEVOTHYROXINE SODIUM 75 MCG PO TABS
75.0000 ug | ORAL_TABLET | Freq: Every day | ORAL | Status: DC
  Administered 2015-03-04 – 2015-03-06 (×3): 75 ug via ORAL
  Filled 2015-03-04 (×5): qty 1

## 2015-03-04 NOTE — Clinical Social Work Note (Signed)
Clinical Social Work Assessment  Patient Details  Name: [name] [name] MRN: [id number] Date of Birth: [birth year]  Date of referral:  03/04/15               Reason for consult:  Facility Placement (Return to [facility])                Permission sought to share information with:  Facility Medical sales representative, Family Supports Permission granted to share information::  Yes, Verbal Permission Granted  Name::     [name] and grandson Barry Dienes. "OK to talk to any of my family"  Agency::     Relationship::     Contact Information:     Housing/Transportation Living arrangements for the past 2 months:  Skilled Nursing Facility Source of Information:  Patient Patient Interpreter Needed:  None Criminal Activity/Legal Involvement Pertinent to Current Situation/Hospitalization:  No - Comment as needed Significant Relationships:  Other Family Members, Adult Children Lives with:  Facility Resident Do you feel safe going back to the place where you live?  Yes Need for family participation in patient care:  Yes (Comment)  Care giving concerns: Patient currently resides at [facility]- states she is there for short term care and rehab. Wants to eventually be able to return home.  Social Worker assessment / plan:  81 year old female- admitted from [name].  Patient is alert and oriented all spheres; very pleasant lady who states that she is feeling "a little better". She was transferred from [facility] to [facility] for continued care. Per MD- possible d/c tomorrow back to the SNF. Bed will be available per [facility]- admissions at [facility].  Fl2 completed and is on chart for MD's signature.  Patient states that she has a supportive family and that they are involved in her care.  Employment status:  Retired Health and safety inspector:  Medicare PT Recommendations:  Skilled Nursing Facility Information / Referral to community resources:  Skilled Nursing Facility    Patient/Family's Response  to care:  Patient is agreeable with currently plan of care and is anxious to return to the facility to continue her rehab so that she can eventually return home.  She feels that she will progress well once she is feeling better.   Patient/Family's Understanding of and Emotional Response to Diagnosis, Current Treatment, and Prognosis:  Patient is able to verbalize a good understanding of her current medical condition, treatment plan and prognosis. She was noted by CSW to be breathing somewhat heavily (almost puffing) and CSW notified RN. Patient denied any anxiety, stress or discomfort at this time.  States "I am feeling a little better."    Emotional Assessment Appearance:  Appears stated age Attitude/Demeanor/Rapport:   (Calm, friendly, cooperative) Affect (typically observed):  Quiet, Calm, Pleasant Orientation:  Oriented to Self, Oriented to Place, Oriented to  Time, Oriented to Situation Alcohol / Substance use:  Tobacco Use (History of smoking- no longer smokes) Psych involvement (Current and /or in the community):  No (Comment)  Discharge Needs  Concerns to be addressed:  Care Coordination Readmission within the last 30 days:    Current discharge risk:  None Barriers to Discharge:  Continued Medical Work up   Lovette Cliche T, [place] 03/04/2015, 6:30 PM

## 2015-03-04 NOTE — Progress Notes (Signed)
Report received for transfer to 5W07 

## 2015-03-04 NOTE — Progress Notes (Signed)
IV in LFA infiltrated following IV Vancomycin administration. About half of medication infused. RN who gave report to RN on 5W for transfer stated that "patient's IVs infiltrate easily". Prior to IV Vanc administration IV site clean, dry and intact and drew back blood. At 1818 IV Vanc hung and at 1840 Patient was checked on and had large infiltration with edema to that site. IV vanc immediately stopped and IV taken out. Arm elevated and heat applied. IV team contacted.

## 2015-03-04 NOTE — Progress Notes (Signed)
[facility] PROGRESS NOTE  [name] [name] ZOX:[id number] DOB: 1929-03-02 DOA: 03/01/2015 PCP: [name] Hy, MD  Assessment/Plan:  [name] [name] is a 81 y.o. female with Past medical history of COPD, hypertension, hypothyroidism, CHF systolic, GERD, chronic kidney disease. The patient was brought in from the nursing home with respiratory failure  and altered mental status.  1. Acute respiratory failure with hypoxia and hypercarbia The patient is presents with  confusion as well as shortness of breath. Chest x-ray is clear. ABG shows significant hypercarbia with acidosis. Improved with BiPAP. BIPAP PRN.  Continue with  DuoNeb. Change Solu-Medrol 40 mg every 12  hours. Continue with broad spectrum antibiotics. Day 3. Chest x ray with pulmonary edema. Change lasix to IV daily.   2.Acute encephalopathy. Most like secondary to hypercarbia. ammonia mildly elevated, has decreased with lactulose.  Avoiding sedative medications.  B 12 deficiency : continue with  supplement.   3.Hypothyroidism.  free T4 high, TSH low.  Continue Synthroid.decrease dose to 75 mcg.   4.Acute on Chronic diastolic heart failure. Change lasix to IV.   5.Fever; continue with IV antibiotics. . Blood culture no growth to date. , urine culture no growth    GERD. Continuing PPI.  Code Status: DNR Family Communication: discussed with grandson [name] [name] who is POA.  Disposition Plan: transfer to medsurgery   Consultants:  Palliative care team.   Procedures:  none  Antibiotics:  Vancomycin 7-20  Ceftazidime 7-20  HPI/Subjective: She is more alert, feeling better. She is hungry. Breathing better.   Objective: Filed Vitals:   03/04/15 0400  BP: 152/62  Pulse: 98  Temp: 98.1 F (36.7 C)  Resp: 25    Intake/Output Summary (Last 24 hours) at 03/04/15 0800 Last data filed at 03/03/15 1530  Gross per 24 hour  Intake    510 ml  Output      0 ml  Net    510 ml    Filed Weights   03/02/15 0530 03/03/15 0659 03/04/15 0400  Weight: 55.6 kg (122 lb 9.2 oz) 55.8 kg (123 lb 0.3 oz) 55.5 kg (122 lb 5.7 oz)    Exam:   General: Alert in no distress, Nasal Cannula Oxygen   Cardiovascular: S 1, S 2 RRR  Respiratory: bilateral air movement, no ronchus, no wheezing. Crackles bases.   Abdomen: bs present, soft, nt  Musculoskeletal: no edema  Data Reviewed: Basic Metabolic Panel:  Recent Labs Lab 03/02/15 0013 03/02/15 0349 03/03/15 0934 03/04/15 0408  NA 144 142 143 140  K 4.3 4.3 4.0 3.8  CL 100* 104 105 100*  CO2 33* 32 32 33*  GLUCOSE 128* 130* 142* 172*  BUN 23* 21* 30* 24*  CREATININE 0.80 0.81 0.81 0.74  CALCIUM 9.4 8.5* 8.9 8.6*   Liver Function Tests:  Recent Labs Lab 03/02/15 0349  AST 17  ALT 11*  ALKPHOS 70  BILITOT 0.6  PROT 5.4*  ALBUMIN 2.8*   No results for input(s): LIPASE, AMYLASE in the last 168 hours.  Recent Labs Lab 03/02/15 0635 03/03/15 0237  AMMONIA 52* 29   CBC:  Recent Labs Lab 03/02/15 0013 03/02/15 0349 03/03/15 0934 03/04/15 0408  WBC 10.5 9.8 7.0 9.5  NEUTROABS 8.8* 7.3  --   --   HGB 12.6 11.2* 10.2* 10.3*  HCT 41.8 37.9 33.7* 33.4*  MCV 91.7 92.7 89.9 88.1  PLT 298 282 253 254   Cardiac Enzymes: No results for input(s): CKTOTAL, CKMB, CKMBINDEX, TROPONINI in the last 168 hours.  BNP (last 3 results)  Recent Labs  11/06/14 2213 11/13/14 1543 03/02/15 0112  BNP 75.8 61.6 274.1*    ProBNP (last 3 results)  Recent Labs  05/15/14 2119 05/17/14 1446  PROBNP 892.3* 690.8*    CBG:  Recent Labs Lab 03/02/15 2144 03/03/15 0806 03/03/15 1213 03/03/15 1721 03/03/15 2201  GLUCAP 133* 153* 211* 292* 171*    Recent Results (from the past 240 hour(s))  Culture, blood (routine x 2)     Status: None (Preliminary result)   Collection Time: 03/02/15 12:20 AM  Result Value Ref Range Status   Specimen Description BLOOD LEFT ANTECUBITAL  Final   Special Requests BOTTLES  DRAWN AEROBIC AND ANAEROBIC 5CC EACH  Final   Culture NO GROWTH 1 DAY  Final   Report Status PENDING  Incomplete  Urine culture     Status: None   Collection Time: 03/02/15 12:24 AM  Result Value Ref Range Status   Specimen Description URINE, CATHETERIZED  Final   Special Requests NONE  Final   Culture NO GROWTH 1 DAY  Final   Report Status 03/03/2015 FINAL  Final  Culture, blood (routine x 2)     Status: None (Preliminary result)   Collection Time: 03/02/15 12:27 AM  Result Value Ref Range Status   Specimen Description BLOOD LEFT HAND  Final   Special Requests BOTTLES DRAWN AEROBIC AND ANAEROBIC 5CC EACH  Final   Culture NO GROWTH 1 DAY  Final   Report Status PENDING  Incomplete  MRSA PCR Screening     Status: None   Collection Time: 03/02/15  5:33 AM  Result Value Ref Range Status   MRSA by PCR NEGATIVE NEGATIVE Final    Comment:        The GeneXpert MRSA Assay (FDA approved for NASAL specimens only), is one component of a comprehensive MRSA colonization surveillance program. It is not intended to diagnose MRSA infection nor to guide or monitor treatment for MRSA infections.      Studies: Dg Chest Port 1 View  03/03/2015   CLINICAL DATA:  81 year old female with hypoxia  EXAM: PORTABLE CHEST - 1 VIEW  COMPARISON:  Prior chest x-ray 03/01/2015  FINDINGS: Cardiac and mediastinal contours remain within normal limits. Atherosclerotic calcification is present in the transverse aorta. There is pulmonary vascular congestion which is increased compared to prior but no overt edema. Developing blunting of the left costophrenic angle. Suspect a small left pleural effusion and associated left basilar atelectasis. Chronic elevation of the left hemidiaphragm. No acute osseous abnormality.  IMPRESSION: 1. Interval development of a blunting of the left costophrenic angle and faint left basilar opacity. Findings are favored to reflect a small left pleural effusion and associated atelectasis.  Infiltrate would be difficult to exclude radiographically. 2. Increasing pulmonary vascular congestion now with trace fluid along the minor fissure but no overt pulmonary edema.   Electronically Signed   By: Malachy Moan M.D.   On: 03/03/2015 08:58    Scheduled Meds: . antiseptic oral rinse  7 mL Mouth Rinse q12n4p  . cefTAZidime (FORTAZ)  IV  2 g Intravenous Q12H  . chlorhexidine  15 mL Mouth Rinse BID  . cyanocobalamin  100 mcg Intramuscular Daily  . enoxaparin (LOVENOX) injection  40 mg Subcutaneous Q24H  . furosemide  40 mg Intravenous Daily  . insulin aspart  0-9 Units Subcutaneous TID WC  . ipratropium-albuterol  3 mL Nebulization Q6H  . lactulose  10 g Oral Daily  . levothyroxine  25  mcg Intravenous QAC breakfast  . methylPREDNISolone (SOLU-MEDROL) injection  40 mg Intravenous Q6H  . pantoprazole (PROTONIX) IV  40 mg Intravenous Q24H  . potassium chloride  40 mEq Oral Once  . vancomycin  500 mg Intravenous Q12H   Continuous Infusions:   Principal Problem:   Acute respiratory failure with hypoxia and hypercarbia Active Problems:   Dyslipidemia   Essential hypertension, benign   Hypothyroidism   Chronic diastolic heart failure   GERD (gastroesophageal reflux disease)   HCAP (healthcare-associated pneumonia)   Palliative care encounter    Time spent: 35 minutes.     Hartley Barefoot A  [facility] Pager [phone]. If 7PM-7AM, please contact night-coverage at [email or web address], password [facility] 03/04/2015, 8:00 AM  LOS: 2 days

## 2015-03-04 NOTE — Progress Notes (Signed)
Report given and patient will be transferred by [facility]  staff

## 2015-03-04 NOTE — Progress Notes (Signed)
ANTIBIOTIC CONSULT NOTE - FOLLOW UP  Pharmacy Consult for Vancomycin + Ceftazidime Indication: pneumonia  No Known Allergies  Patient Measurements: Height: 5' (152.4 cm) Weight: 122 lb 5.7 oz (55.5 kg) IBW/kg (Calculated) : 45.5  Vital Signs: Temp: 97.5 F (36.4 C) (07/22 1259) Temp Source: Oral (07/22 1259) BP: 149/63 mmHg (07/22 1259) Pulse Rate: 92 (07/22 1259) Intake/Output from previous day: 07/21 0701 - 07/22 0700 In: 670 [P.O.:520; IV Piggyback:150] Out: -  Intake/Output from this shift: Total I/O In: 480 [P.O.:480] Out: 200 [Urine:200]  Labs:  Recent Labs  03/02/15 0349 03/03/15 0934 03/04/15 0408  WBC 9.8 7.0 9.5  HGB 11.2* 10.2* 10.3*  PLT 282 253 254  CREATININE 0.81 0.81 0.74   Estimated Creatinine Clearance: 40.9 mL/min (by C-G formula based on Cr of 0.74).  Recent Labs  03/04/15 1545  VANCOTROUGH 13     Microbiology: Recent Results (from the past 720 hour(s))  Culture, blood (routine x 2)     Status: None (Preliminary result)   Collection Time: 03/02/15 12:20 AM  Result Value Ref Range Status   Specimen Description BLOOD LEFT ANTECUBITAL  Final   Special Requests BOTTLES DRAWN AEROBIC AND ANAEROBIC 5CC EACH  Final   Culture NO GROWTH 2 DAYS  Final   Report Status PENDING  Incomplete  Urine culture     Status: None   Collection Time: 03/02/15 12:24 AM  Result Value Ref Range Status   Specimen Description URINE, CATHETERIZED  Final   Special Requests NONE  Final   Culture NO GROWTH 1 DAY  Final   Report Status 03/03/2015 FINAL  Final  Culture, blood (routine x 2)     Status: None (Preliminary result)   Collection Time: 03/02/15 12:27 AM  Result Value Ref Range Status   Specimen Description BLOOD LEFT HAND  Final   Special Requests BOTTLES DRAWN AEROBIC AND ANAEROBIC 5CC EACH  Final   Culture NO GROWTH 2 DAYS  Final   Report Status PENDING  Incomplete  MRSA PCR Screening     Status: None   Collection Time: 03/02/15  5:33 AM  Result  Value Ref Range Status   MRSA by PCR NEGATIVE NEGATIVE Final    Comment:        The GeneXpert MRSA Assay (FDA approved for NASAL specimens only), is one component of a comprehensive MRSA colonization surveillance program. It is not intended to diagnose MRSA infection nor to guide or monitor treatment for MRSA infections.     Anti-infectives    Start     Dose/Rate Route Frequency Ordered Stop   03/02/15 1500  vancomycin (VANCOCIN) 500 mg in sodium chloride 0.9 % 100 mL IVPB     500 mg 100 mL/hr over 60 Minutes Intravenous Every 12 hours 03/02/15 0339     03/02/15 1200  cefTAZidime (FORTAZ) 2 g in dextrose 5 % 50 mL IVPB     2 g 100 mL/hr over 30 Minutes Intravenous Every 12 hours 03/02/15 0339     03/02/15 0130  vancomycin (VANCOCIN) IVPB 1000 mg/200 mL premix     1,000 mg 200 mL/hr over 60 Minutes Intravenous  Once 03/02/15 0122 03/02/15 0425   03/02/15 0115  cefTAZidime (FORTAZ) 2 g in dextrose 5 % 50 mL IVPB     2 g 100 mL/hr over 30 Minutes Intravenous  Once 03/02/15 0101 03/02/15 0214      Assessment: 81yo female continues on vanc and ceftazidime for pneumonia/acute respiratory failure.  Pt has been seen by  palliative care >> plans to continue treatment w/ abx for pneumonia, may D/C to SNF w/ hospice.  Pt afebrile, RR elevated at 26, BP and HR ok, WBC WNL. Vanc trough slightly below goal today at 13.  7/20 BCx2 >> ngtd 7/20 UCx >> ngtd  Ceftazidime 7/20 >>  Vanc 7/20 >>   Goal of Therapy:  Vancomycin trough level 15-20 mcg/ml  Plan:  - Increase to Vanc  IV Q12h - Continue ceftazidime 2g IV Q8h - Monitor renal fxn, C&S, clinical improvement, VT as appropriate - F/u discharge/hospice plans >> continue abx?  Waynette Buttery, PharmD Clinical Pharmacist Pager: [phone] 03/04/2015 4:54 PM

## 2015-03-04 NOTE — Progress Notes (Signed)
Daily Progress Note   Patient Name: [name] [name]       Date: 03/04/2015 DOB: 1928-11-29  Age: 81 y.o. MRN#: [id number] Attending Physician: Elmarie Shiley, MD Primary Care Physician: Sheela Stack, MD Admit Date: 03/01/2015  Reason for Consultation/Follow-up: Establishing goals of care, symptom mgt Met with pt's grandson and his wife, regarding overall condition, disease progression with hypercarbic respiratory failure in setting of diastolic heart failure, and deconditioning. Pt has been intolerant of BiPaP although it did help. Pt is opioid naive. We did discuss opioids for mgt of dyspnea.  Subjective: Pt looks more SHOB during our assessment, verbalizing chest pain. Seems more confused, but is alert Interval Events: No bipap last night. CXR ? Faint opacity left base; increased pulmonary congestion without overt pulmonary edema Length of Stay: 2 days  Current Medications: Scheduled Meds:  . antiseptic oral rinse  7 mL Mouth Rinse q12n4p  . carvedilol  3.125 mg Oral BID WC  . cefTAZidime (FORTAZ)  IV  2 g Intravenous Q12H  . chlorhexidine  15 mL Mouth Rinse BID  . cyanocobalamin  100 mcg Intramuscular Daily  . enoxaparin (LOVENOX) injection  40 mg Subcutaneous Q24H  . furosemide  40 mg Intravenous Daily  . insulin aspart  0-9 Units Subcutaneous TID WC  . ipratropium-albuterol  3 mL Nebulization Q6H  . lactulose  10 g Oral Daily  . levothyroxine  75 mcg Oral QAC breakfast  . methylPREDNISolone (SOLU-MEDROL) injection  40 mg Intravenous Q12H  . pantoprazole (PROTONIX) IV  40 mg Intravenous Q24H  . vancomycin  500 mg Intravenous Q12H    Continuous Infusions:    PRN Meds: acetaminophen, morphine CONCENTRATE  Palliative Performance Scale: 30-40%%     Vital Signs: BP 149/63 mmHg  Pulse 92  Temp(Src) 97.5 F (36.4 C) (Oral)  Resp 26  Ht 5' (1.524 m)  Wt 55.5 kg (122 lb 5.7 oz)  BMI 23.90 kg/m2  SpO2 91% SpO2: SpO2: 91 % O2 Device: O2 Device: Nasal  Cannula O2 Flow Rate: O2 Flow Rate (L/min): 3 L/min  Intake/output summary:  Intake/Output Summary (Last 24 hours) at 03/04/15 1351 Last data filed at 03/04/15 1348  Gross per 24 hour  Intake    820 ml  Output    200 ml  Net    620 ml   LBM:   Baseline Weight: Weight: 58.968 kg (130 lb) Most recent weight: Weight: 55.5 kg (122 lb 5.7 oz)  Physical Exam: General: Elderly frail female. She is alert, breathing labored Resp: Breathing more labored. Hear more congestion              Additional Data Reviewed: Recent Labs     03/03/15  0934  03/04/15  0408  WBC  7.0  9.5  HGB  10.2*  10.3*  PLT  253  254  NA  143  140  BUN  30*  24*  CREATININE  0.81  0.74     Problem List:  Patient Active Problem List   Diagnosis Date Noted  . Palliative care encounter   . HCAP (healthcare-associated pneumonia) 03/02/2015  . Acute respiratory failure with hypoxia and hypercarbia 03/02/2015  . Rectal bleeding 01/25/2015  . GI bleed 01/24/2015  . Lower GI bleed 01/23/2015  . DNR (do not resuscitate) 12/09/2014  . COPD with exacerbation 11/07/2014  . HLD (hyperlipidemia) 11/07/2014  . Diarrhea 11/07/2014  . Troponin level elevated-felt to be secondary to acute resp failure 09/23/2014  . Compression fracture of body of  thoracic vertebra-wears a brace 09/23/2014  . RBBB 09/23/2014  . Acute on chronic diastolic CHF (congestive heart failure) 09/20/2014  . CKD (chronic kidney disease), stage II 09/20/2014  . Acute on chronic respiratory failure with hypoxia   . GERD (gastroesophageal reflux disease) 05/18/2014  . Chronic diastolic heart failure 39/10/90  . COPD exacerbation 05/15/2014  . Knee pain 01/02/2013  . Dyslipidemia 11/05/2012  . Essential hypertension, benign 11/05/2012  . Hypothyroidism 11/05/2012  . Unspecified vitamin D deficiency 11/05/2012     Palliative Care Assessment & Plan    Code Status:  DNR  Goals of Care:  Hospice consult in facility placed  If pt  needs bipap in the hospital, attempt to treat while she is here but family recognizes that she may pull it off  Continue other treatment for pna, fluid overload as indicated with goal to stabilize and return to SNF with hospice. Family leaning towards no further admissions as condition declines  Desire for further Chaplaincy support:no  3. Symptom Management:  Dyspnea: After discussing role of opioids to mange end-stage pulmonary disease family agreed to low dose ms04 concentrate 2.5 mg q6 prn. Did discuss risks and benefits of treatment specifically increased sedation short-tem side effect unless pt is experiencing another clinical change but increased sedation would increase the likelihood of c02 retention. Goal is for ms04 to augment targeted pulmonary tx in place as recuse for acute dyspnea  4. Prognosis: < 6 months  5. Discharge Planning: [place] with Hospice   Care plan was discussed with family and RN  Thank you for allowing the Palliative Medicine Team to assist in the care of this patient.   Time In: 0930 Time Out: 1100 Total Time 90 Prolonged Time Billed  yes     Greater than 50%  of this time was spent counseling and coordinating care related to the above assessment and plan.   Dory Horn, NP  03/04/2015, 1:51 PM  Please contact Palliative Medicine Team phone at [phone] for questions and concerns.

## 2015-03-04 NOTE — Progress Notes (Signed)
NURSING PROGRESS NOTE  [name] [id number] Transfer Data: 03/04/2015 1:50 PM Attending Provider: Alba Cory, MD WUJ:[id number] Hessie Diener, MD Code Status: DNR  [name] [name] is a 81 y.o. female patient transferred from [facility] -No acute distress noted.  -No complaints of shortness of breath.  -No complaints of chest pain.   Alert and oriented to self and situation only.   Blood pressure 149/63, pulse 92, temperature 97.5 F (36.4 C), temperature source Oral, resp. rate 26, height 5' (1.524 m), weight 55.5 kg (122 lb 5.7 oz), SpO2 91 %.   IV Fluids:  IV in place, occlusive dsg intact without redness, IV cath forearm left, condition patent and no redness none.   Allergies:  Review of patient's allergies indicates no known allergies.  Past Medical History:   has a past medical history of Hypertension; Hyperlipidemia; Thyroid disease; Hypokalemia; Vitamin D deficiency; COPD (chronic obstructive pulmonary disease); CHF (congestive heart failure); GERD (gastroesophageal reflux disease); and CKD (chronic kidney disease), stage III.  Past Surgical History:   has past surgical history that includes Cholecystectomy and Abdominal hysterectomy.  Social History:   reports that she has quit smoking. Her smoking use included Cigarettes. She has never used smokeless tobacco. She reports that she does not drink alcohol or use illicit drugs.  Skin: moisture associated breakdown to buttock  Patient/Family orientated to room. Admission inpatient armband information verified with patient/family to include name and date of birth and placed on patient arm. Side rails up x 2, fall assessment and education completed with patient/family. Patient/family able to verbalize understanding of risk associated with falls and verbalized understanding to call for assistance before getting out of bed. Call light within reach. Patient/family able to voice and demonstrate understanding of unit  orientation instructions.    Will continue to evaluate and treat per MD orders.

## 2015-03-05 LAB — BASIC METABOLIC PANEL
Anion gap: 7 (ref 5–15)
BUN: 28 mg/dL — AB (ref 6–20)
CHLORIDE: 98 mmol/L — AB (ref 101–111)
CO2: 35 mmol/L — AB (ref 22–32)
Calcium: 8.6 mg/dL — ABNORMAL LOW (ref 8.9–10.3)
Creatinine, Ser: 0.71 mg/dL (ref 0.44–1.00)
GFR calc Af Amer: >60 mL/min (ref >60)
GFR calc non Af Amer: >60 mL/min (ref >60)
GLUCOSE: 143 mg/dL — AB (ref 65–99)
POTASSIUM: 4.4 mmol/L (ref 3.5–5.1)
Sodium: 140 mmol/L (ref 135–145)

## 2015-03-05 LAB — GLUCOSE, CAPILLARY
GLUCOSE-CAPILLARY: 119 mg/dL — AB (ref 65–99)
GLUCOSE-CAPILLARY: 148 mg/dL — AB (ref 65–99)
Glucose-Capillary: 149 mg/dL — ABNORMAL HIGH (ref 65–99)
Glucose-Capillary: 215 mg/dL — ABNORMAL HIGH (ref 65–99)

## 2015-03-05 LAB — CBC
HCT: 34.3 % — ABNORMAL LOW (ref 36.0–46.0)
HEMOGLOBIN: 10.6 g/dL — AB (ref 12.0–15.0)
MCH: 27.2 pg (ref 26.0–34.0)
MCHC: 30.9 g/dL (ref 30.0–36.0)
MCV: 88.2 fL (ref 78.0–100.0)
PLATELETS: 236 10*3/uL (ref 150–400)
RBC: 3.89 MIL/uL (ref 3.87–5.11)
RDW: 14.8 % (ref 11.5–15.5)
WBC: 7 10*3/uL (ref 4.0–10.5)

## 2015-03-05 MED ORDER — LEVOFLOXACIN 250 MG PO TABS
250.0000 mg | ORAL_TABLET | Freq: Every day | ORAL | Status: DC
  Administered 2015-03-06: 250 mg via ORAL
  Filled 2015-03-05: qty 1

## 2015-03-05 MED ORDER — PREDNISONE 20 MG PO TABS
40.0000 mg | ORAL_TABLET | Freq: Every day | ORAL | Status: DC
  Administered 2015-03-05 – 2015-03-06 (×2): 40 mg via ORAL
  Filled 2015-03-05 (×3): qty 2

## 2015-03-05 MED ORDER — FUROSEMIDE 40 MG PO TABS
40.0000 mg | ORAL_TABLET | Freq: Every day | ORAL | Status: DC
  Administered 2015-03-05 – 2015-03-06 (×2): 40 mg via ORAL
  Filled 2015-03-05: qty 1

## 2015-03-05 MED ORDER — PANTOPRAZOLE SODIUM 40 MG PO TBEC
40.0000 mg | DELAYED_RELEASE_TABLET | Freq: Every day | ORAL | Status: DC
  Administered 2015-03-05 – 2015-03-06 (×2): 40 mg via ORAL
  Filled 2015-03-05 (×2): qty 1

## 2015-03-05 MED ORDER — LEVOFLOXACIN 500 MG PO TABS
500.0000 mg | ORAL_TABLET | Freq: Once | ORAL | Status: AC
  Administered 2015-03-05: 500 mg via ORAL
  Filled 2015-03-05: qty 1

## 2015-03-05 NOTE — Progress Notes (Signed)
[facility] PROGRESS NOTE  [name] ZOX:[id number] DOB: 07/31/1929 DOA: 03/01/2015 PCP: Julian Hy, MD  Assessment/Plan:  [name] [name] is a 81 y.o. female with Past medical history of COPD, hypertension, hypothyroidism, CHF systolic, GERD, chronic kidney disease. The patient was brought in from the nursing home with respiratory failure  and altered mental status.  1. Acute respiratory failure with hypoxia and hypercarbia The patient is presents with  confusion as well as shortness of breath. Chest x-ray is clear. ABG shows significant hypercarbia with acidosis. Improved with BiPAP. BIPAP PRN.  Continue with  DuoNeb. Change Solu-Medrol to prednisone.  Change antibiotics to Levaquin.  Day 4. Chest x ray with pulmonary edema.  Lasix 40 mg daily.  Weight 55--56   2.Acute encephalopathy. Most like secondary to hypercarbia. ammonia mildly elevated, has decreased with lactulose.  Avoiding sedative medications. Alert,  improved.   B 12 deficiency : continue with  supplement.   3.Hypothyroidism.  free T4 high, TSH low.  Continue Synthroid.decrease dose to 75 mcg.   4.Acute on Chronic diastolic heart failure. Lasix 40 mg daily  5.Fever; change IV antibiotics to oral . . Blood culture no growth to date. , urine culture no growth  Levaquin.   GERD. Continuing PPI.  Code Status: DNR Family Communication: discussed with grandson Lenor Derrick who is POA.  Disposition Plan: SNF 7-24   Consultants:  Palliative care team.   Procedures:  none  Antibiotics:  Vancomycin 7-20  Ceftazidime 7-20  HPI/Subjective: She is alert, breathing better.   Objective: Filed Vitals:   03/05/15 0838  BP: 135/65  Pulse: 72  Temp:   Resp:     Intake/Output Summary (Last 24 hours) at 03/05/15 0851 Last data filed at 03/04/15 1348  Gross per 24 hour  Intake    480 ml  Output    200 ml  Net    280 ml   Filed Weights   03/04/15 0400 03/04/15  1259 03/05/15 0607  Weight: 55.5 kg (122 lb 5.7 oz) 55.5 kg (122 lb 5.7 oz) 56 kg (123 lb 7.3 oz)    Exam:   General: Alert in no distress,   Cardiovascular: S 1, S 2 RRR  Respiratory: bilateral air movement, no ronchus, no wheezing.  Abdomen: bs present, soft, nt  Musculoskeletal: no edema  Data Reviewed: Basic Metabolic Panel:  Recent Labs Lab 03/02/15 0013 03/02/15 0349 03/03/15 0934 03/04/15 0408 03/05/15 0427  NA 144 142 143 140 140  K 4.3 4.3 4.0 3.8 4.4  CL 100* 104 105 100* 98*  CO2 33* 32 32 33* 35*  GLUCOSE 128* 130* 142* 172* 143*  BUN 23* 21* 30* 24* 28*  CREATININE 0.80 0.81 0.81 0.74 0.71  CALCIUM 9.4 8.5* 8.9 8.6* 8.6*   Liver Function Tests:  Recent Labs Lab 03/02/15 0349  AST 17  ALT 11*  ALKPHOS 70  BILITOT 0.6  PROT 5.4*  ALBUMIN 2.8*   No results for input(s): LIPASE, AMYLASE in the last 168 hours.  Recent Labs Lab 03/02/15 0635 03/03/15 0237  AMMONIA 52* 29   CBC:  Recent Labs Lab 03/02/15 0013 03/02/15 0349 03/03/15 0934 03/04/15 0408 03/05/15 0427  WBC 10.5 9.8 7.0 9.5 7.0  NEUTROABS 8.8* 7.3  --   --   --   HGB 12.6 11.2* 10.2* 10.3* 10.6*  HCT 41.8 37.9 33.7* 33.4* 34.3*  MCV 91.7 92.7 89.9 88.1 88.2  PLT 298 282 253 254 236   Cardiac Enzymes: No results for input(s): CKTOTAL,  CKMB, CKMBINDEX, TROPONINI in the last 168 hours. BNP (last 3 results)  Recent Labs  11/06/14 2213 11/13/14 1543 03/02/15 0112  BNP 75.8 61.6 274.1*    ProBNP (last 3 results)  Recent Labs  05/15/14 2119 05/17/14 1446  PROBNP 892.3* 690.8*    CBG:  Recent Labs Lab 03/04/15 0810 03/04/15 1237 03/04/15 1703 03/04/15 2210 03/05/15 0832  GLUCAP 155* 155* 180* 128* 119*    Recent Results (from the past 240 hour(s))  Culture, blood (routine x 2)     Status: None (Preliminary result)   Collection Time: 03/02/15 12:20 AM  Result Value Ref Range Status   Specimen Description BLOOD LEFT ANTECUBITAL  Final   Special  Requests BOTTLES DRAWN AEROBIC AND ANAEROBIC 5CC EACH  Final   Culture NO GROWTH 2 DAYS  Final   Report Status PENDING  Incomplete  Urine culture     Status: None   Collection Time: 03/02/15 12:24 AM  Result Value Ref Range Status   Specimen Description URINE, CATHETERIZED  Final   Special Requests NONE  Final   Culture NO GROWTH 1 DAY  Final   Report Status 03/03/2015 FINAL  Final  Culture, blood (routine x 2)     Status: None (Preliminary result)   Collection Time: 03/02/15 12:27 AM  Result Value Ref Range Status   Specimen Description BLOOD LEFT HAND  Final   Special Requests BOTTLES DRAWN AEROBIC AND ANAEROBIC 5CC EACH  Final   Culture NO GROWTH 2 DAYS  Final   Report Status PENDING  Incomplete  MRSA PCR Screening     Status: None   Collection Time: 03/02/15  5:33 AM  Result Value Ref Range Status   MRSA by PCR NEGATIVE NEGATIVE Final    Comment:        The GeneXpert MRSA Assay (FDA approved for NASAL specimens only), is one component of a comprehensive MRSA colonization surveillance program. It is not intended to diagnose MRSA infection nor to guide or monitor treatment for MRSA infections.      Studies: Dg Chest Port 1 View  03/03/2015   CLINICAL DATA:  81 year old female with hypoxia  EXAM: PORTABLE CHEST - 1 VIEW  COMPARISON:  Prior chest x-ray 03/01/2015  FINDINGS: Cardiac and mediastinal contours remain within normal limits. Atherosclerotic calcification is present in the transverse aorta. There is pulmonary vascular congestion which is increased compared to prior but no overt edema. Developing blunting of the left costophrenic angle. Suspect a small left pleural effusion and associated left basilar atelectasis. Chronic elevation of the left hemidiaphragm. No acute osseous abnormality.  IMPRESSION: 1. Interval development of a blunting of the left costophrenic angle and faint left basilar opacity. Findings are favored to reflect a small left pleural effusion and  associated atelectasis. Infiltrate would be difficult to exclude radiographically. 2. Increasing pulmonary vascular congestion now with trace fluid along the minor fissure but no overt pulmonary edema.   Electronically Signed   By: Malachy Moan M.D.   On: 03/03/2015 08:58    Scheduled Meds: . antiseptic oral rinse  7 mL Mouth Rinse q12n4p  . carvedilol  3.125 mg Oral BID WC  . chlorhexidine  15 mL Mouth Rinse BID  . cyanocobalamin  100 mcg Intramuscular Daily  . enoxaparin (LOVENOX) injection  40 mg Subcutaneous Q24H  . furosemide  40 mg Oral Daily  . insulin aspart  0-9 Units Subcutaneous TID WC  . ipratropium-albuterol  3 mL Nebulization Q6H  . lactulose  10 g Oral  Daily  . [START ON 03/06/2015] levofloxacin  250 mg Oral Daily  . levofloxacin  500 mg Oral Once  . levothyroxine  75 mcg Oral QAC breakfast  . methylPREDNISolone (SOLU-MEDROL) injection  40 mg Intravenous Q12H  . pantoprazole (PROTONIX) IV  40 mg Intravenous Q24H   Continuous Infusions:   Principal Problem:   Acute respiratory failure with hypoxia and hypercarbia Active Problems:   Dyslipidemia   Essential hypertension, benign   Hypothyroidism   Chronic diastolic heart failure   GERD (gastroesophageal reflux disease)   HCAP (healthcare-associated pneumonia)   Palliative care encounter   Shortness of breath    Time spent: 35 minutes.     Hartley Barefoot A  [facility] Pager [phone]. If 7PM-7AM, please contact night-coverage at [email or web address], password [facility] 03/05/2015, 8:51 AM  LOS: 3 days

## 2015-03-05 NOTE — Progress Notes (Signed)
Notified Nurse Practioner Mrs.Kirby in reference to pt being difficult to maintain IV's for antibiotic therapy. Pt is a DNR and stated that she no longer wants to have IV's placed.

## 2015-03-06 LAB — GLUCOSE, CAPILLARY
Glucose-Capillary: 91 mg/dL (ref 65–99)
Glucose-Capillary: 149 mg/dL — ABNORMAL HIGH (ref 65–99)

## 2015-03-06 MED ORDER — CYANOCOBALAMIN 1000 MCG/ML IJ SOLN: 100.0000 ug | Freq: Every day | INTRAMUSCULAR | Status: AC

## 2015-03-06 MED ORDER — MORPHINE SULFATE (CONCENTRATE) 10 MG/0.5ML PO SOLN: 2.5000 mg | Freq: Four times a day (QID) | ORAL | Status: AC | PRN

## 2015-03-06 MED ORDER — LORAZEPAM 0.5 MG PO TABS: 0.5000 mg | ORAL_TABLET | Freq: Three times a day (TID) | ORAL | Status: AC | PRN

## 2015-03-06 MED ORDER — LEVOTHYROXINE SODIUM 75 MCG PO TABS: 75.0000 ug | ORAL_TABLET | Freq: Every day | ORAL | Status: AC

## 2015-03-06 MED ORDER — PREDNISONE 20 MG PO TABS: ORAL_TABLET | ORAL | Status: AC

## 2015-03-06 MED ORDER — LEVOFLOXACIN 250 MG PO TABS: 250.0000 mg | ORAL_TABLET | Freq: Every day | ORAL | Status: AC

## 2015-03-06 NOTE — Discharge Summary (Signed)
Physician Discharge Summary  [facility] ZOX:[id number] DOB: November 29, 1928 DOA: 03/01/2015  PCP: Julian Hy, MD  Admit date: 03/01/2015 Discharge date: 03/06/2015  Time spent: 35 minutes  Recommendations for Outpatient Follow-up:  Needs to be follow by palliative care team/  Discharge Diagnoses:    Acute respiratory failure with hypoxia and hypercarbia   Dyslipidemia   Essential hypertension, benign   Hypothyroidism   Chronic diastolic heart failure   GERD (gastroesophageal reflux disease)   HCAP (healthcare-associated pneumonia)   Palliative care encounter   Shortness of breath   Discharge Condition: stable.   Diet recommendation: heart healthy  Filed Weights   03/04/15 1259 03/05/15 0607 03/06/15 0700  Weight: 55.5 kg (122 lb 5.7 oz) 56 kg (123 lb 7.3 oz) 57 kg (125 lb 10.6 oz)    History of present illness:  Murl [place] is a 81 y.o. female with Past medical history of COPD, hypertension, hypothyroidism, CHF systolic, GERD, chronic kidney disease. The patient was brought in from the nursing home with complaints of shortness of breath and altered mental status. The history is limited since the patient was nonverbal and was unable to provide any significant history. As per the documentation patient was at his baseline few days ago. No recent change in medications reported. No nausea no vomiting no diarrhea reported. Chest pain no abdominal pain. Patient was initially communicative but at the time of my evaluation was nonverbal and was only withdrawing to painful stimuli.  Hospital Course:   Murl [place] is a 81 y.o. female with Past medical history of COPD, hypertension, hypothyroidism, CHF systolic, GERD, chronic kidney disease. The patient was brought in from the nursing home with respiratory failure and altered mental status.  1. Acute respiratory failure with hypoxia and hypercarbia The patient is presents with confusion as well  as shortness of breath. Chest x-ray is clear. ABG shows significant hypercarbia with acidosis. Improved with BiPAP. BIPAP PRN.  Continue with DuoNeb. Change Solu-Medrol to prednisone.  Change antibiotics to Levaquin. Day 5. Discharge on 4 more days.  Chest x ray with pulmonary edema.  Lasix 40 mg daily.  Weight 55--56  2.Acute encephalopathy. Most like secondary to hypercarbia. ammonia mildly elevated, has decreased with lactulose.  Avoiding sedative medications. Alert, improved.   B 12 deficiency : continue with supplement.   3.Hypothyroidism. free T4 high, TSH low.  Continue Synthroid.decrease dose to 75 mcg.   4.Acute on Chronic diastolic heart failure. Lasix 40 mg daily  5.Fever; change IV antibiotics to oral . . Blood culture no growth to date. , urine culture no growth  Levaquin.   GERD. Continuing PPI.  Procedures:  none  Consultations:  palliative  Discharge Exam: Filed Vitals:   03/06/15 0835  BP: 155/72  Pulse: 72  Temp:   Resp:     General: NAD Cardiovascular: S 1, S 2 RRR Respiratory: CTA  Discharge Instructions   Discharge Instructions    Diet - low sodium heart healthy    Complete by:  As directed      Increase activity slowly    Complete by:  As directed           Current Discharge Medication List    START taking these medications   Details  cyanocobalamin (,VITAMIN B-12,) 1000 MCG/ML injection Inject 0.1 mLs (100 mcg total) into the muscle daily. Qty: 1 mL, Refills: 0    levofloxacin (LEVAQUIN) 250 MG tablet Take 1 tablet (250 mg total) by mouth daily. Qty: 4 tablet, Refills:  0    Morphine Sulfate (MORPHINE CONCENTRATE) 10 MG/0.5ML SOLN concentrated solution Take 0.13 mLs (2.6 mg total) by mouth every 6 (six) hours as needed for severe pain or shortness of breath. Qty: 42 mL, Refills: 0    predniSONE (DELTASONE) 20 MG tablet Take 2 tablets for 4 days, then stop. Qty: 8 tablet, Refills: 0      CONTINUE these  medications which have CHANGED   Details  levothyroxine (SYNTHROID, LEVOTHROID) 75 MCG tablet Take 1 tablet (75 mcg total) by mouth daily before breakfast. Qty: 30 tablet, Refills: 0    LORazepam (ATIVAN) 0.5 MG tablet Take 1 tablet (0.5 mg total) by mouth every 8 (eight) hours as needed for anxiety. Qty: 15 tablet, Refills: 0      CONTINUE these medications which have NOT CHANGED   Details  albuterol (PROVENTIL) (2.5 MG/3ML) 0.083% nebulizer solution Take 3 mLs (2.5 mg total) by nebulization every 2 (two) hours as needed for wheezing.    carvedilol (COREG) 3.125 MG tablet Take 1 tablet (3.125 mg total) by mouth 2 (two) times daily with a meal. Qty: 30 tablet, Refills: 0    cholecalciferol (VITAMIN D) 1000 UNITS tablet Take 1,000 Units by mouth daily.    feeding supplement, RESOURCE BREEZE, (RESOURCE BREEZE) LIQD Take 1 Container by mouth 3 (three) times daily between meals. Refills: 0    furosemide (LASIX) 40 MG tablet Take 1 tablet (40 mg total) by mouth daily. Qty: 30 tablet, Refills: 0    guaifenesin (ROBITUSSIN) 100 MG/5ML syrup Take 15 mLs (300 mg total) by mouth every 6 (six) hours. Qty: 120 mL, Refills: 0    ipratropium (ATROVENT) 0.02 % nebulizer solution Take 0.5 mg by nebulization 4 (four) times daily.    omeprazole (PRILOSEC OTC) 20 MG tablet Take 20 mg by mouth daily.    polyethylene glycol (MIRALAX / GLYCOLAX) packet Take 17 g by mouth 2 (two) times daily. Qty: 14 each, Refills: 0    pravastatin (PRAVACHOL) 40 MG tablet Take 40 mg by mouth daily at 6 PM.     sennosides-docusate sodium (SENOKOT-S) 8.6-50 MG tablet Take 2 tablets by mouth at bedtime.      STOP taking these medications     HYDROcodone-acetaminophen (NORCO/VICODIN) 5-325 MG per tablet      lisinopril (PRINIVIL,ZESTRIL) 2.5 MG tablet      potassium chloride SA (K-DUR,KLOR-CON) 20 MEQ tablet        No Known Allergies Follow-up Information    Follow up with Julian Hy, MD In 1 week.    Specialty:  Endocrinology   Contact information:   [address] [place] [address] [phone]        The results of significant diagnostics from this hospitalization (including imaging, microbiology, ancillary and laboratory) are listed below for reference.    Significant Diagnostic Studies: Dg Chest Port 1 View  03/03/2015   CLINICAL DATA:  81 year old female with hypoxia  EXAM: PORTABLE CHEST - 1 VIEW  COMPARISON:  Prior chest x-ray 03/01/2015  FINDINGS: Cardiac and mediastinal contours remain within normal limits. Atherosclerotic calcification is present in the transverse aorta. There is pulmonary vascular congestion which is increased compared to prior but no overt edema. Developing blunting of the left costophrenic angle. Suspect a small left pleural effusion and associated left basilar atelectasis. Chronic elevation of the left hemidiaphragm. No acute osseous abnormality.  IMPRESSION: 1. Interval development of a blunting of the left costophrenic angle and faint left basilar opacity. Findings are favored to reflect a  small left pleural effusion and associated atelectasis. Infiltrate would be difficult to exclude radiographically. 2. Increasing pulmonary vascular congestion now with trace fluid along the minor fissure but no overt pulmonary edema.   Electronically Signed   By: Malachy Moan M.D.   On: 03/03/2015 08:58   Dg Chest Port 1 View  03/02/2015   CLINICAL DATA:  81 year old female with shortness of breath. Concern for pneumonia.  EXAM: PORTABLE CHEST - 1 VIEW  COMPARISON:  Radiograph dated 01/23/2015  FINDINGS: The patient is mildly rotated to the left. Single-view of the chest demonstrates emphysematous changes of the lungs. No focal consolidation, pleural effusion, or pneumothorax. Stable cardiac silhouette. Degenerative changes of the spine.  IMPRESSION: Emphysema.  No focal consolidation or pneumothorax.   Electronically Signed   By: Elgie Collard M.D.   On:  03/02/2015 00:56    Microbiology: Recent Results (from the past 240 hour(s))  Culture, blood (routine x 2)     Status: None (Preliminary result)   Collection Time: 03/02/15 12:20 AM  Result Value Ref Range Status   Specimen Description BLOOD LEFT ANTECUBITAL  Final   Special Requests BOTTLES DRAWN AEROBIC AND ANAEROBIC 5CC EACH  Final   Culture NO GROWTH 3 DAYS  Final   Report Status PENDING  Incomplete  Urine culture     Status: None   Collection Time: 03/02/15 12:24 AM  Result Value Ref Range Status   Specimen Description URINE, CATHETERIZED  Final   Special Requests NONE  Final   Culture NO GROWTH 1 DAY  Final   Report Status 03/03/2015 FINAL  Final  Culture, blood (routine x 2)     Status: None (Preliminary result)   Collection Time: 03/02/15 12:27 AM  Result Value Ref Range Status   Specimen Description BLOOD LEFT HAND  Final   Special Requests BOTTLES DRAWN AEROBIC AND ANAEROBIC 5CC EACH  Final   Culture NO GROWTH 3 DAYS  Final   Report Status PENDING  Incomplete  MRSA PCR Screening     Status: None   Collection Time: 03/02/15  5:33 AM  Result Value Ref Range Status   MRSA by PCR NEGATIVE NEGATIVE Final    Comment:        The GeneXpert MRSA Assay (FDA approved for NASAL specimens only), is one component of a comprehensive MRSA colonization surveillance program. It is not intended to diagnose MRSA infection nor to guide or monitor treatment for MRSA infections.      Labs: Basic Metabolic Panel:  Recent Labs Lab 03/02/15 0013 03/02/15 0349 03/03/15 0934 03/04/15 0408 03/05/15 0427  NA 144 142 143 140 140  K 4.3 4.3 4.0 3.8 4.4  CL 100* 104 105 100* 98*  CO2 33* 32 32 33* 35*  GLUCOSE 128* 130* 142* 172* 143*  BUN 23* 21* 30* 24* 28*  CREATININE 0.80 0.81 0.81 0.74 0.71  CALCIUM 9.4 8.5* 8.9 8.6* 8.6*   Liver Function Tests:  Recent Labs Lab 03/02/15 0349  AST 17  ALT 11*  ALKPHOS 70  BILITOT 0.6  PROT 5.4*  ALBUMIN 2.8*   No results for  input(s): LIPASE, AMYLASE in the last 168 hours.  Recent Labs Lab 03/02/15 0635 03/03/15 0237  AMMONIA 52* 29   CBC:  Recent Labs Lab 03/02/15 0013 03/02/15 0349 03/03/15 0934 03/04/15 0408 03/05/15 0427  WBC 10.5 9.8 7.0 9.5 7.0  NEUTROABS 8.8* 7.3  --   --   --   HGB 12.6 11.2* 10.2* 10.3* 10.6*  HCT  41.8 37.9 33.7* 33.4* 34.3*  MCV 91.7 92.7 89.9 88.1 88.2  PLT 298 282 253 254 236   Cardiac Enzymes: No results for input(s): CKTOTAL, CKMB, CKMBINDEX, TROPONINI in the last 168 hours. BNP: BNP (last 3 results)  Recent Labs  11/06/14 2213 11/13/14 1543 03/02/15 0112  BNP 75.8 61.6 274.1*    ProBNP (last 3 results)  Recent Labs  05/15/14 2119 05/17/14 1446  PROBNP 892.3* 690.8*    CBG:  Recent Labs Lab 03/05/15 0832 03/05/15 1147 03/05/15 1744 03/05/15 2138 03/06/15 0801  GLUCAP 119* 148* 149* 215* 91       Signed:  Carroll Lingelbach A  [facility] 03/06/2015, 11:17 AM

## 2015-03-06 NOTE — Progress Notes (Signed)
Pt prepared for d/c to SNF. IV d/c'd. Skin intact except as most recently charted. Vitals are stable. Report called to receiving facility. Pt to be transported by ambulance service. 

## 2015-03-07 LAB — CULTURE, BLOOD (ROUTINE X 2)
CULTURE: NO GROWTH
CULTURE: NO GROWTH

## 2015-04-14 DEATH — deceased

## 2015-06-14 NOTE — Patient Outreach (Signed)
[facility]) Care Management  06/14/2015  Murl [employer] Corporation[place]ia Scharfenberg 11/11/1928 [id number]     Referral from [employer], assigned George Inaavina Green, RN to outreach for [facility] services.  Thanks, Corrie MckusickLisa O. Sharlee BlewMoore, AABA [facility] CM Assistant Phone: [phone] Fax: [phone]

## 2015-06-20 ENCOUNTER — Other Ambulatory Visit: Payer: Self-pay

## 2015-06-20 NOTE — Patient Outreach (Signed)
[facility]) Care Management  06/20/2015  [name] [name] CorporationVirginia [name] 02/28/1929 [id number]  Telephone call to patient regarding [facility] high risk referral. Unable to reach patient. HIPAA compliant voice message left with call back phone number.   PLAN; RNCM will attempt 2nd telephone outreach to patient within 3 business days.   George InaDavina Thayne Cindric RN,BSN,CCM [facility] Telephonic Care Coordinator [phone]

## 2015-06-21 ENCOUNTER — Other Ambulatory Visit: Payer: Self-pay

## 2015-06-21 NOTE — Patient Outreach (Signed)
[facility]) Care Management  06/21/2015  [name] Lennar CorporationVirginia [name] 07/16/1929 [id number]   Received return call from person identifying themselves as patients grandson, [name] [name].  Mr. Barry DienesOwens states patient is deceased.  PLAN: RNCM will refer to Nena PolioLisa Moore to close due to patient being deceased. RNCM will notify patients primary MD of closure.   George InaDavina Guiliana Shor RN,BSN,CCM [facility] Telephonic Care Coordinator [phone]

## 2015-06-22 ENCOUNTER — Ambulatory Visit: Payer: Self-pay

## 2015-06-22 NOTE — Patient Outreach (Signed)
[facility]) Care Management  06/22/2015  [name] [name] 11/04/1928 [id number]   Notification from George Inaavina Green, RN to close case patients grandson, [name] RougeDonald [name] states patient is deceased.  Thanks, Corrie MckusickLisa O. Sharlee BlewMoore, [facility] CM Assistant Phone: [phone] Fax: [phone]

## 2017-04-30 IMAGING — CT CT ABD-PELV W/ CM
2 of 5 series · 17 of 46 positions shown, 19 images · IV contrast (OMNIPAQUE 300)
Comparison: Abdominal radiograph 11/10/2014. Chest radiographs
11/13/2014.

CLINICAL DATA: Abdominal pain for 3 days.

EXAM:
CT ABDOMEN AND PELVIS WITH CONTRAST
TECHNIQUE: Multidetector CT imaging of the abdomen and pelvis was performed
using the standard protocol following bolus administration of
intravenous contrast.
CONTRAST:  50mL OMNIPAQUE IOHEXOL 300 MG/ML SOLN, 80mL OMNIPAQUE
IOHEXOL 300 MG/ML SOLN

[Series 2: abd/pel with · axial · 0.72mm/px · z∈[+1453,+1788]mm · 14 of 75 slices shown, 16 images]
[im 4/75  soft-tissue]
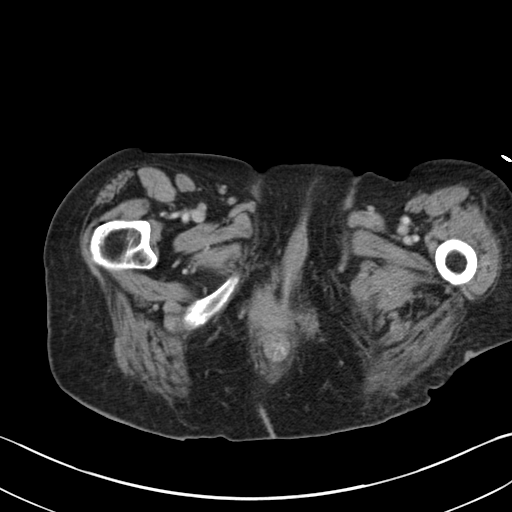
[im 4/75  bone]
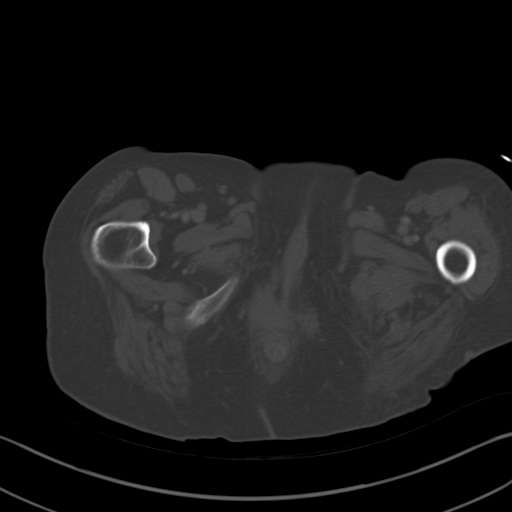
[im 8/75  soft-tissue]
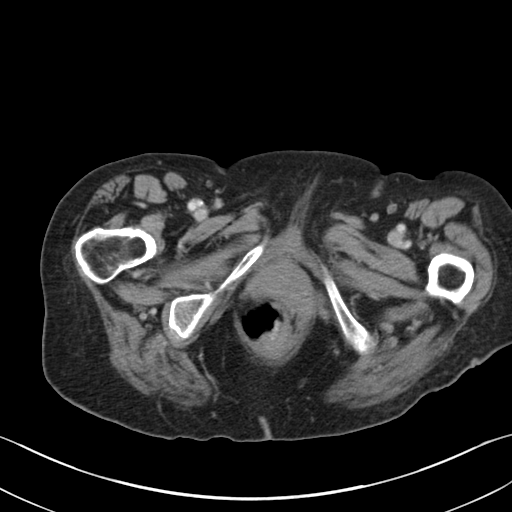
[im 16/75  soft-tissue]
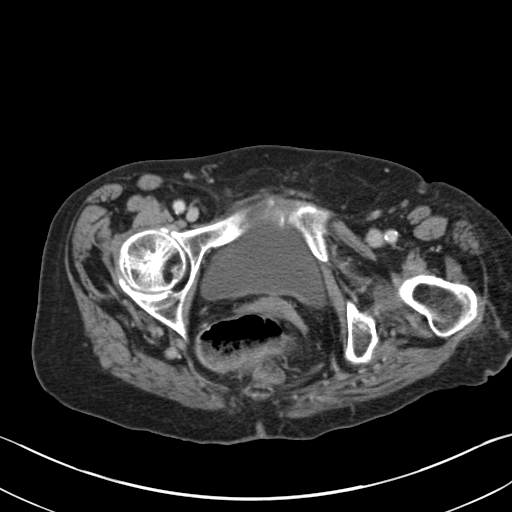
[im 20/75  soft-tissue]
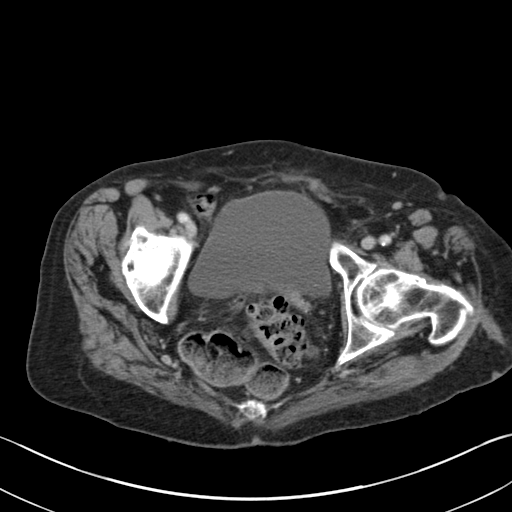
[im 24/75  soft-tissue]
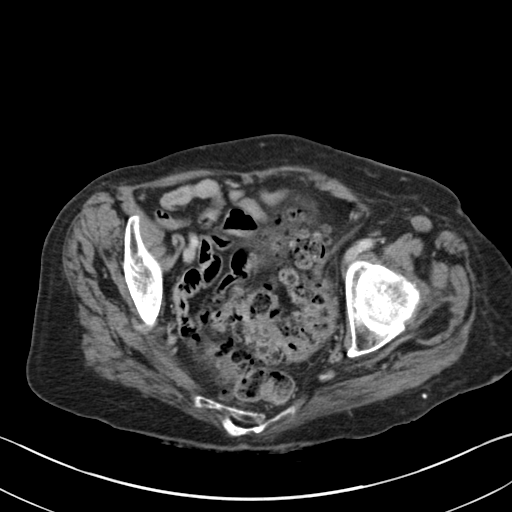
[im 32/75  soft-tissue]
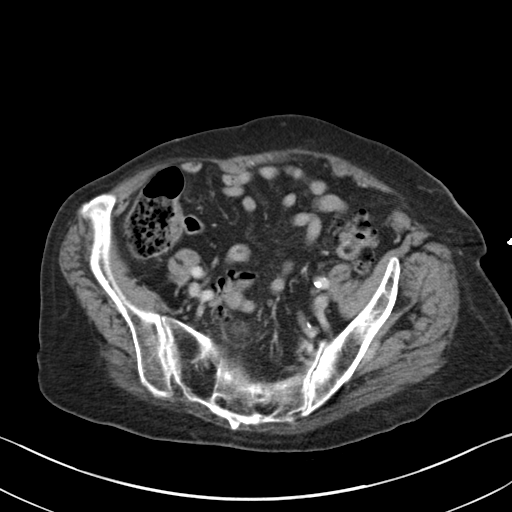
[im 36/75  soft-tissue]
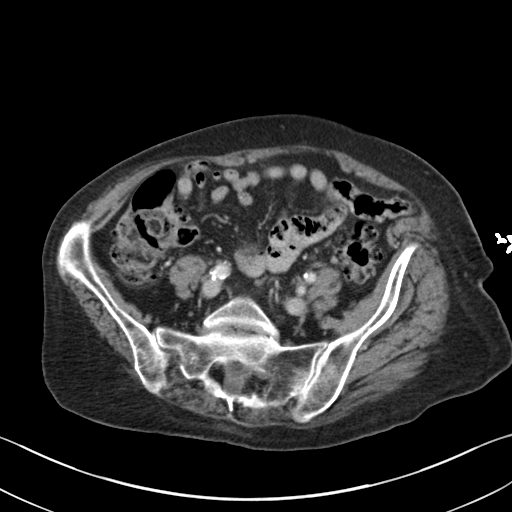
[im 39/75  soft-tissue]
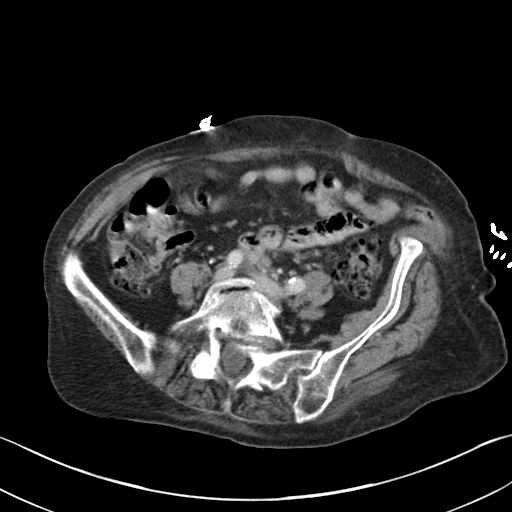
[im 43/75  soft-tissue]
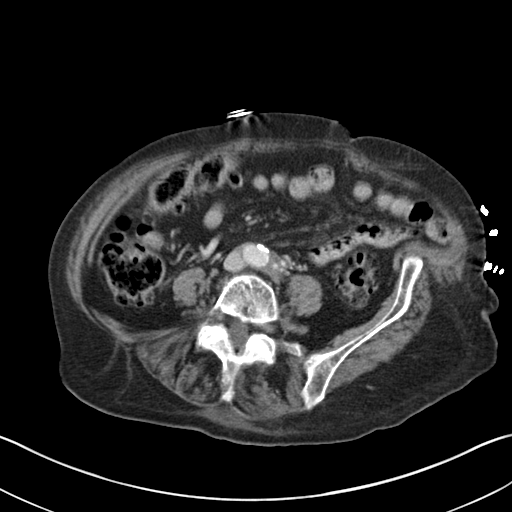
[im 43/75  bone]
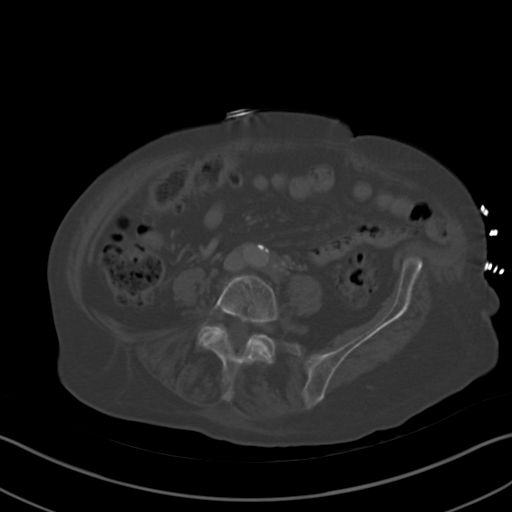
[im 51/75  soft-tissue]
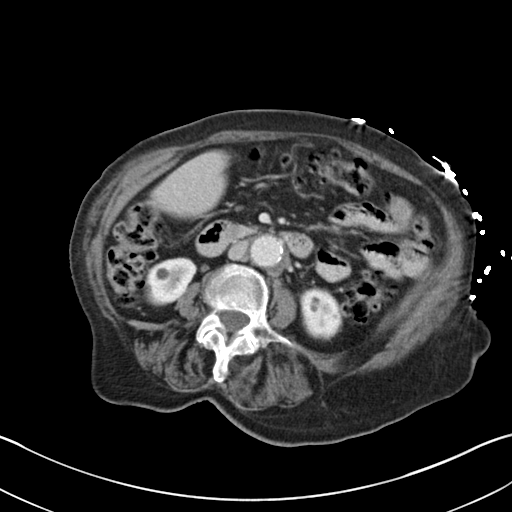
[im 55/75  soft-tissue]
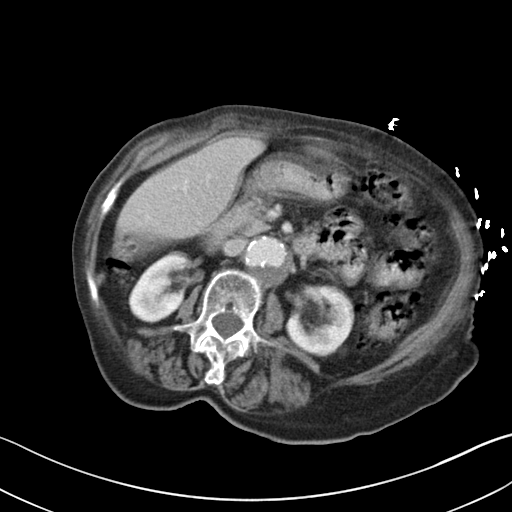
[im 59/75  soft-tissue]
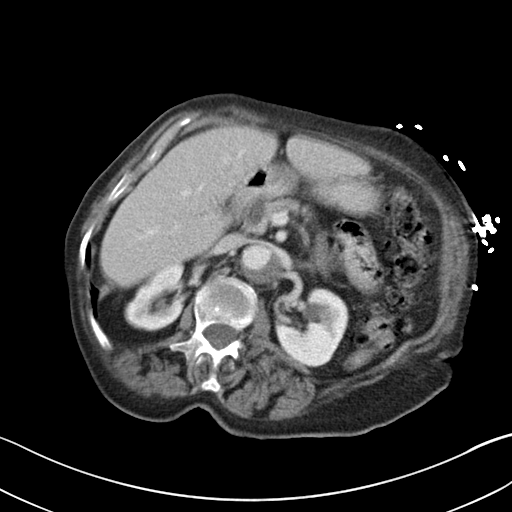
[im 67/75  soft-tissue]
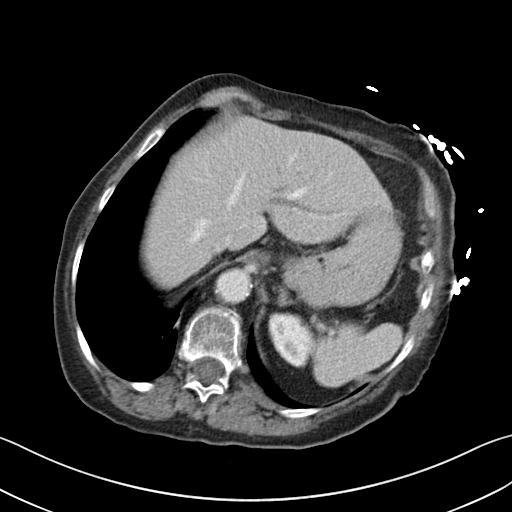
[im 71/75  soft-tissue]
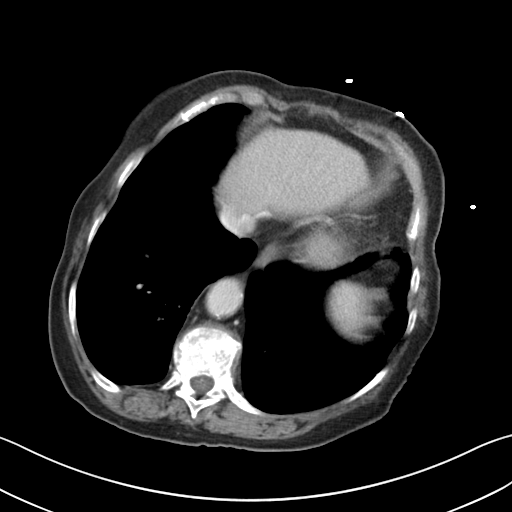

[Series 6: coronal a/|p · coronal · 0.75mm/px · 3 of 91 slices shown]
[im 31/91  soft-tissue]
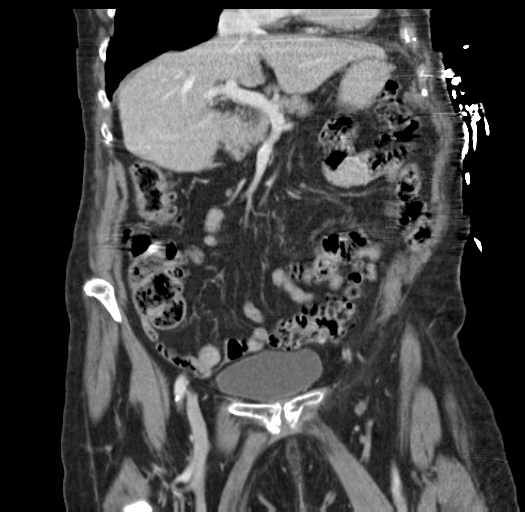
[im 41/91  soft-tissue]
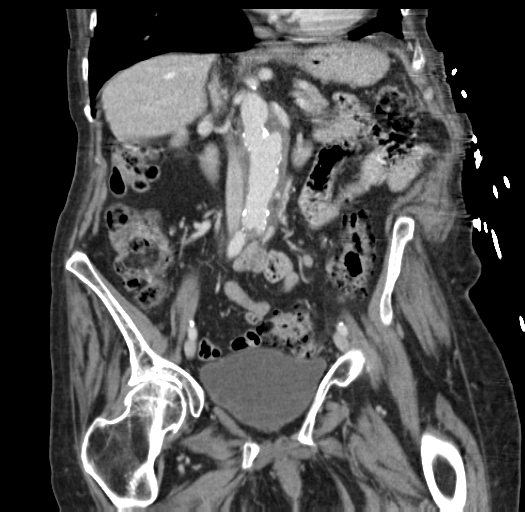
[im 51/91  soft-tissue]
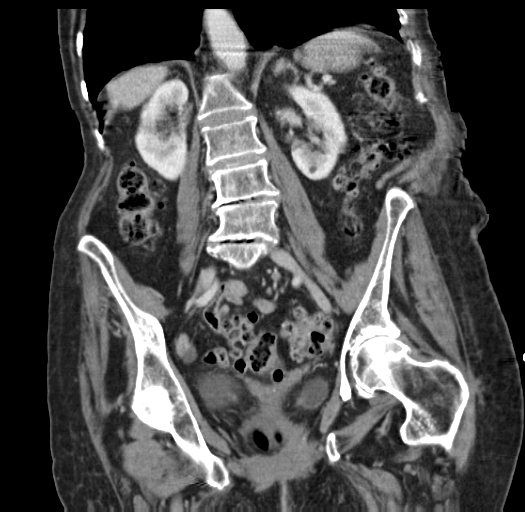

[17 of 46 positions shown; findings below may reference images not displayed]

FINDINGS: Minimal scarring is noted in the lung bases.

The gallbladder is surgically absent. No significant biliary
dilatation is seen. The liver, spleen, adrenal glands, and pancreas
are unremarkable. Subcentimeter low-density lesions in both kidneys
are too small to fully characterize but most likely represent cysts.

There is no evidence of bowel obstruction. There is diffuse colonic
diverticulosis with extensive involvement of the sigmoid colon. No
definite bowel wall thickening or pericolonic inflammatory changes
are seen to indicate acute diverticulitis.

Bladder is unremarkable. Uterus is absent. No pelvic mass is seen.
Infrarenal abdominal aortic aneurysm measures up to 3.8 cm in
diameter with prominent plaque/ thrombus within the aneurysm sac.
Moderate diffuse atherosclerotic vascular calcification is present
in the abdomen and pelvis. No free fluid or enlarged lymph nodes are
identified. Chronic, moderate T11 compression fracture is again
seen. Advanced disc space height loss is present at L3-4 and L4-5.
IMPRESSION: 1. Extensive colonic diverticulosis. No significant inflammatory
change identified to definitely indicate acute diverticulitis.
2. 3.8 cm infrarenal abdominal aortic aneurysm.
